# Patient Record
Sex: Male | Born: 1990 | Race: White | Hispanic: No | Marital: Married | Smoking: Never smoker
Health system: Southern US, Community
[De-identification: ages and names within clinical notes are randomized; demographics above are authoritative.]

## PROBLEM LIST (undated history)

## (undated) DIAGNOSIS — M549 Dorsalgia, unspecified: Secondary | ICD-10-CM

## (undated) DIAGNOSIS — F419 Anxiety disorder, unspecified: Secondary | ICD-10-CM

## (undated) DIAGNOSIS — F988 Other specified behavioral and emotional disorders with onset usually occurring in childhood and adolescence: Secondary | ICD-10-CM

## (undated) DIAGNOSIS — T7840XA Allergy, unspecified, initial encounter: Secondary | ICD-10-CM

## (undated) HISTORY — DX: Anxiety disorder, unspecified: F41.9

## (undated) HISTORY — DX: Other specified behavioral and emotional disorders with onset usually occurring in childhood and adolescence: F98.8

## (undated) HISTORY — PX: TONSILLECTOMY: SHX5217

## (undated) HISTORY — DX: Allergy, unspecified, initial encounter: T78.40XA

## (undated) HISTORY — DX: Dorsalgia, unspecified: M54.9

---

## 2015-05-29 ENCOUNTER — Encounter: Payer: Self-pay | Admitting: Family Medicine

## 2015-05-29 DIAGNOSIS — F988 Other specified behavioral and emotional disorders with onset usually occurring in childhood and adolescence: Secondary | ICD-10-CM | POA: Insufficient documentation

## 2015-05-29 DIAGNOSIS — J302 Other seasonal allergic rhinitis: Secondary | ICD-10-CM | POA: Insufficient documentation

## 2015-05-29 DIAGNOSIS — G8929 Other chronic pain: Secondary | ICD-10-CM | POA: Insufficient documentation

## 2015-05-29 DIAGNOSIS — M5417 Radiculopathy, lumbosacral region: Secondary | ICD-10-CM | POA: Insufficient documentation

## 2015-05-29 DIAGNOSIS — M545 Low back pain, unspecified: Secondary | ICD-10-CM | POA: Insufficient documentation

## 2015-05-29 DIAGNOSIS — J3089 Other allergic rhinitis: Secondary | ICD-10-CM

## 2015-05-29 DIAGNOSIS — E739 Lactose intolerance, unspecified: Secondary | ICD-10-CM | POA: Insufficient documentation

## 2015-05-29 DIAGNOSIS — F410 Panic disorder [episodic paroxysmal anxiety] without agoraphobia: Secondary | ICD-10-CM | POA: Insufficient documentation

## 2015-05-31 ENCOUNTER — Ambulatory Visit: Payer: Self-pay | Admitting: Family Medicine

## 2015-06-03 ENCOUNTER — Encounter: Payer: Self-pay | Admitting: Family Medicine

## 2015-06-03 ENCOUNTER — Ambulatory Visit (INDEPENDENT_AMBULATORY_CARE_PROVIDER_SITE_OTHER): Payer: BLUE CROSS/BLUE SHIELD | Admitting: Family Medicine

## 2015-06-03 VITALS — BP 128/74 | HR 89 | Temp 98.0°F | Resp 18 | Ht 72.0 in | Wt 183.8 lb

## 2015-06-03 DIAGNOSIS — F988 Other specified behavioral and emotional disorders with onset usually occurring in childhood and adolescence: Secondary | ICD-10-CM

## 2015-06-03 DIAGNOSIS — F909 Attention-deficit hyperactivity disorder, unspecified type: Secondary | ICD-10-CM

## 2015-06-03 DIAGNOSIS — M545 Low back pain, unspecified: Secondary | ICD-10-CM

## 2015-06-03 DIAGNOSIS — F410 Panic disorder [episodic paroxysmal anxiety] without agoraphobia: Secondary | ICD-10-CM

## 2015-06-03 DIAGNOSIS — G8911 Acute pain due to trauma: Secondary | ICD-10-CM

## 2015-06-03 MED ORDER — AMPHETAMINE-DEXTROAMPHETAMINE 15 MG PO TABS: 15.0000 mg | ORAL_TABLET | Freq: Every day | ORAL | Status: DC

## 2015-06-03 MED ORDER — AMPHETAMINE-DEXTROAMPHET ER 25 MG PO CP24: 25.0000 mg | ORAL_CAPSULE | ORAL | Status: DC

## 2015-06-03 MED ORDER — ALPRAZOLAM 0.5 MG PO TABS: 0.5000 mg | ORAL_TABLET | ORAL | Status: DC | PRN

## 2015-06-03 MED ORDER — AMPHETAMINE-DEXTROAMPHETAMINE 15 MG PO TABS: 1.0000 | ORAL_TABLET | Freq: Every day | ORAL | Status: DC | PRN

## 2015-06-03 MED ORDER — AMPHETAMINE-DEXTROAMPHET ER 25 MG PO CP24: 25.0000 mg | ORAL_CAPSULE | Freq: Every morning | ORAL | Status: DC

## 2015-06-03 MED ORDER — CYCLOBENZAPRINE HCL 10 MG PO TABS: 10.0000 mg | ORAL_TABLET | Freq: Every day | ORAL | Status: DC
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
## 2015-09-06 ENCOUNTER — Ambulatory Visit (INDEPENDENT_AMBULATORY_CARE_PROVIDER_SITE_OTHER): Payer: BLUE CROSS/BLUE SHIELD | Admitting: Family Medicine

## 2015-09-06 ENCOUNTER — Encounter: Payer: Self-pay | Admitting: Family Medicine

## 2015-09-06 VITALS — BP 134/76 | HR 92 | Temp 98.7°F | Resp 18 | Ht 72.0 in | Wt 182.7 lb

## 2015-09-06 DIAGNOSIS — Z23 Encounter for immunization: Secondary | ICD-10-CM | POA: Diagnosis not present

## 2015-09-06 DIAGNOSIS — F410 Panic disorder [episodic paroxysmal anxiety] without agoraphobia: Secondary | ICD-10-CM

## 2015-09-06 DIAGNOSIS — J3089 Other allergic rhinitis: Secondary | ICD-10-CM

## 2015-09-06 DIAGNOSIS — J302 Other seasonal allergic rhinitis: Secondary | ICD-10-CM

## 2015-09-06 DIAGNOSIS — J309 Allergic rhinitis, unspecified: Secondary | ICD-10-CM

## 2015-09-06 DIAGNOSIS — M545 Low back pain, unspecified: Secondary | ICD-10-CM

## 2015-09-06 DIAGNOSIS — F988 Other specified behavioral and emotional disorders with onset usually occurring in childhood and adolescence: Secondary | ICD-10-CM

## 2015-09-06 DIAGNOSIS — G8929 Other chronic pain: Secondary | ICD-10-CM

## 2015-09-06 DIAGNOSIS — G8911 Acute pain due to trauma: Secondary | ICD-10-CM

## 2015-09-06 DIAGNOSIS — F909 Attention-deficit hyperactivity disorder, unspecified type: Secondary | ICD-10-CM

## 2015-09-06 MED ORDER — AMPHETAMINE-DEXTROAMPHETAMINE 15 MG PO TABS: 15.0000 mg | ORAL_TABLET | Freq: Every day | ORAL | Status: DC

## 2015-09-06 MED ORDER — AMPHETAMINE-DEXTROAMPHET ER 25 MG PO CP24: 25.0000 mg | ORAL_CAPSULE | ORAL | Status: DC

## 2015-09-06 MED ORDER — AMPHETAMINE-DEXTROAMPHET ER 25 MG PO CP24: 25.0000 mg | ORAL_CAPSULE | Freq: Every morning | ORAL | Status: DC

## 2015-09-06 MED ORDER — FLUTICASONE PROPIONATE 50 MCG/ACT NA SUSP: 2.0000 | Freq: Every day | NASAL | Status: DC

## 2015-09-06 MED ORDER — ALPRAZOLAM 0.5 MG PO TABS: 0.5000 mg | ORAL_TABLET | ORAL | Status: DC | PRN

## 2015-09-06 MED ORDER — AMPHETAMINE-DEXTROAMPHETAMINE 15 MG PO TABS: 1.0000 | ORAL_TABLET | Freq: Every day | ORAL | Status: DC | PRN

## 2015-09-06 MED ORDER — CYCLOBENZAPRINE HCL 10 MG PO TABS: 10.0000 mg | ORAL_TABLET | Freq: Every day | ORAL | Status: DC

## 2015-09-06 NOTE — Progress Notes (Signed)
Name: William Rivers   MRN: [id number]    DOB: 08-14-1991   Date:09/06/2015       Progress Note  Subjective  Chief Complaint  Chief Complaint  Patient presents with  . Medication Refill    3 month F/U  . ADHD    Well Controlled  . Allergic Rhinitis     Well controlled takes medication PRN    HPI  Chronic low back pain: he wakes up feeling very stiff in am's, working as an Personnel officer, always crawling under tight spaces. He states usually aching on lower back, but sometimes shoots down right leg. Worse when sitting for a prolonged period of time. Pain right now is 3/10.  He is not using Flexeril, he got a prescription for an acute episode, we will resume medication for him to take qhs.   ADHD: Havard works long hours, about 10 hours daily, takes Adderal XR in am's and a Adderal  at lunch, it helps him stay focuses and also helps him with anxiety. He has lost weight, but has been stable now. He states that without medication, girlfriend states he acts like a child. He also notices that when he does not take it he can't accomplish anything at home.   Anxiety: taking Alprazolam prn, 45 pills for 90 days, he denies side effects. He is aware of risk of taking it and driving.   AR: he has mild clear rhinorrhea, he also has some congestion at times, no itching.   Patient Active Problem List   Diagnosis Date Noted  . ADD (attention deficit disorder) 05/29/2015  . Chronic LBP 05/29/2015  . Lactose intolerance 05/29/2015  . Panic attack 05/29/2015  . Allergic rhinitis 05/29/2015  . Lumbosacral radiculitis 05/29/2015    Past Surgical History  Procedure Laterality Date  . Tonsillectomy      Family History  Problem Relation Age of Onset  . Anxiety disorder Mother     Social History   Social History  . Marital Status: Single    Spouse Name: N/A  . Number of Children: N/A  . Years of Education: N/A   Occupational History  . Not on file.   Social History Main Topics  .  Smoking status: Never Smoker   . Smokeless tobacco: Current User    Types: Chew  . Alcohol Use: 7.2 oz/week    12 Standard drinks or equivalent per week  . Drug Use: No  . Sexual Activity:    Partners: Female   Other Topics Concern  . Not on file   Social History Narrative     Current outpatient prescriptions:  .  ALPRAZolam (XANAX) 0.5 MG tablet, Take 1 tablet (0.5 mg total) by mouth as needed., Disp: 45 tablet, Rfl: 0 .  amphetamine-dextroamphetamine (ADDERALL XR) 25 MG 24 hr capsule, Take 1 capsule by mouth every morning., Disp: 30 capsule, Rfl: 0 .  amphetamine-dextroamphetamine (ADDERALL XR) 25 MG 24 hr capsule, Take 1 capsule by mouth every morning., Disp: 30 capsule, Rfl: 0 .  amphetamine-dextroamphetamine (ADDERALL XR) 25 MG 24 hr capsule, Take 1 capsule by mouth every morning., Disp: 30 capsule, Rfl: 0 .  amphetamine-dextroamphetamine (ADDERALL) 15 MG tablet, Take 1 tablet by mouth daily as needed. At lunch-when working long hours, Disp: 30 tablet, Rfl: 0 .  amphetamine-dextroamphetamine (ADDERALL) 15 MG tablet, Take 1 tablet by mouth daily., Disp: 30 tablet, Rfl: 0 .  amphetamine-dextroamphetamine (ADDERALL) 15 MG tablet, Take 1 tablet by mouth daily., Disp: 30 tablet, Rfl: 0 .  loratadine (  CLARITIN) 10 MG tablet, Take 1 tablet by mouth daily., Disp: , Rfl:  .  cyclobenzaprine (FLEXERIL) 10 MG tablet, Take 1 tablet (10 mg total) by mouth at bedtime., Disp: 30 tablet, Rfl: 2  No Known Allergies   ROS  Constitutional: Negative for fever or weight change.  Respiratory: Negative for cough and shortness of breath.   Cardiovascular: Negative for chest pain or palpitations.  Gastrointestinal: Negative for abdominal pain, no bowel changes.  Musculoskeletal: Negative for gait problem or joint swelling.  Skin: Negative for rash.  Neurological: Negative for dizziness or headache.  No other specific complaints in a complete review of systems (except as listed in HPI  above).  Objective  Filed Vitals:   09/06/15 1629  BP: 134/76  Pulse: 92  Temp: 98.7 F (37.1 C)  TempSrc: Oral  Resp: 18  Height: 6' (1.829 m)  Weight: 182 lb 11.2 oz (82.872 kg)  SpO2: 98%    Body mass index is 24.77 kg/(m^2).  Physical Exam  Constitutional: Patient appears well-developed and well-nourished.  No distress.  HEENT: head atraumatic, normocephalic, pupils equal and reactive to light,, neck supple, throat within normal limits, boggy turbinates and clear rhinorrhea Cardiovascular: Normal rate, regular rhythm and normal heart sounds.  No murmur heard. No BLE edema. Pulmonary/Chest: Effort normal and breath sounds normal. No respiratory distress. Abdominal: Soft.  There is no tenderness. Psychiatric: Patient has a normal mood and affect. behavior is normal. Judgment and thought content normal.   PHQ2/9: [facility] 09/06/2015 06/03/2015  Decreased Interest 0 0  Down, Depressed, Hopeless 0 0  PHQ - 2 Score 0 0     Fall Risk: Fall Risk  09/06/2015 06/03/2015  Falls in the past year? No No     Functional Status Survey: Is the patient deaf or have difficulty hearing?: No Does the patient have difficulty seeing, even when wearing glasses/contacts?: No Does the patient have difficulty concentrating, remembering, or making decisions?: No Does the patient have difficulty walking or climbing stairs?: No Does the patient have difficulty dressing or bathing?: No Does the patient have difficulty doing errands alone such as visiting a doctor's office or shopping?: No    Assessment & Plan  1. Chronic LBP   Advised Flexeril qhs and stretching and strengthening exercises for his back - cyclobenzaprine (FLEXERIL) 10 MG tablet; Take 1 tablet (10 mg total) by mouth at bedtime.  Dispense: 30 tablet; Refill: 2  2. Needs flu shot  - Flu Vaccine QUAD 36+ mos PF IM (Fluarix & Fluzone Quad PF) -refused  3. ADD (attention deficit disorder)  Continue  medication, working well for him - amphetamine-dextroamphetamine (ADDERALL XR) 25 MG 24 hr capsule; Take 1 capsule by mouth every morning.  Dispense: 30 capsule; Refill: 0 - amphetamine-dextroamphetamine (ADDERALL XR) 25 MG 24 hr capsule; Take 1 capsule by mouth every morning.  Dispense: 30 capsule; Refill: 0 - amphetamine-dextroamphetamine (ADDERALL XR) 25 MG 24 hr capsule; Take 1 capsule by mouth every morning.  Dispense: 30 capsule; Refill: 0 - amphetamine-dextroamphetamine (ADDERALL) 15 MG tablet; Take 1 tablet by mouth daily as needed. At lunch-when working long hours  Dispense: 30 tablet; Refill: 0 - amphetamine-dextroamphetamine (ADDERALL) 15 MG tablet; Take 1 tablet by mouth daily.  Dispense: 30 tablet; Refill: 0 - amphetamine-dextroamphetamine (ADDERALL) 15 MG tablet; Take 1 tablet by mouth daily.  Dispense: 30 tablet; Refill: 0  4. Panic attack  - ALPRAZolam (XANAX) 0.5 MG tablet; Take 1 tablet (0.5 mg total) by mouth as needed.  Dispense: 45 tablet; Refill: 0

## 2015-09-06 NOTE — Patient Instructions (Signed)

## 2015-11-11 ENCOUNTER — Encounter: Payer: Self-pay | Admitting: Family Medicine

## 2015-11-11 ENCOUNTER — Ambulatory Visit (INDEPENDENT_AMBULATORY_CARE_PROVIDER_SITE_OTHER): Payer: BLUE CROSS/BLUE SHIELD | Admitting: Family Medicine

## 2015-11-11 VITALS — BP 124/68 | HR 95 | Temp 98.3°F | Resp 16 | Ht 72.0 in | Wt 179.9 lb

## 2015-11-11 DIAGNOSIS — J3089 Other allergic rhinitis: Secondary | ICD-10-CM

## 2015-11-11 DIAGNOSIS — J309 Allergic rhinitis, unspecified: Secondary | ICD-10-CM

## 2015-11-11 DIAGNOSIS — F410 Panic disorder [episodic paroxysmal anxiety] without agoraphobia: Secondary | ICD-10-CM

## 2015-11-11 DIAGNOSIS — F988 Other specified behavioral and emotional disorders with onset usually occurring in childhood and adolescence: Secondary | ICD-10-CM

## 2015-11-11 DIAGNOSIS — M545 Low back pain, unspecified: Secondary | ICD-10-CM

## 2015-11-11 DIAGNOSIS — G8929 Other chronic pain: Secondary | ICD-10-CM | POA: Diagnosis not present

## 2015-11-11 DIAGNOSIS — F909 Attention-deficit hyperactivity disorder, unspecified type: Secondary | ICD-10-CM

## 2015-11-11 DIAGNOSIS — J302 Other seasonal allergic rhinitis: Secondary | ICD-10-CM

## 2015-11-11 MED ORDER — FLUTICASONE PROPIONATE 50 MCG/ACT NA SUSP: 2.0000 | Freq: Every day | NASAL | Status: DC

## 2015-11-11 MED ORDER — AMPHETAMINE-DEXTROAMPHETAMINE 15 MG PO TABS: 15.0000 mg | ORAL_TABLET | Freq: Every day | ORAL | Status: DC

## 2015-11-11 MED ORDER — AMPHETAMINE-DEXTROAMPHET ER 25 MG PO CP24: 25.0000 mg | ORAL_CAPSULE | Freq: Every morning | ORAL | Status: DC

## 2015-11-11 MED ORDER — CYCLOBENZAPRINE HCL 10 MG PO TABS: 10.0000 mg | ORAL_TABLET | Freq: Every day | ORAL | Status: DC

## 2015-11-11 MED ORDER — ALPRAZOLAM 0.5 MG PO TABS: 0.5000 mg | ORAL_TABLET | ORAL | Status: DC | PRN

## 2015-11-11 MED ORDER — AMPHETAMINE-DEXTROAMPHET ER 25 MG PO CP24: 25.0000 mg | ORAL_CAPSULE | ORAL | Status: DC

## 2015-11-11 MED ORDER — AMPHETAMINE-DEXTROAMPHETAMINE 15 MG PO TABS: 1.0000 | ORAL_TABLET | Freq: Every day | ORAL | Status: DC | PRN

## 2015-11-11 NOTE — Progress Notes (Signed)
Name: William Rivers   MRN: [id number]    DOB: 1991/05/23   Date:11/11/2015       Progress Note  Subjective  Chief Complaint  Chief Complaint  Patient presents with  . Medication Refill    3 month F/U  . ADD    Well controlled  . Panic Attacks    HPI  Chronic low back pain: he wakes up feeling very stiff in am's, but sleeping better with flexeril. Works as an Personnel officerelectrician, always crawling under tight spaces. He states usually aching on lower back, but sometimes shoots down right leg. Worse when sitting for a prolonged period of time. Pain right now is 2/10.  ADHD: William Rivers works long hours, about 10 hours daily, takes Adderal XR in am's and a Adderal 15mg  at lunch, it helps him stay focuses and also helps him with anxiety, stops his mind from worrying  He has lost weight, and a few more pounds since last visit but weight loss has been mild now - he states he had an URI recently . He states that when he runs out of medication, girlfriend states he acts like a child. He also notices that when he does not take it he can't accomplish anything at home.   Anxiety: taking Alprazolam prn, 45 pills for 90 days, he only takes it prn for panic attacks,  he denies side effects. He is aware of risk of taking it and driving.   AR: he has mild clear rhinorrhea, he also has some congestion at times, no itching, occasional sneezing   Patient Active Problem List   Diagnosis Date Noted  . ADD (attention deficit disorder) 05/29/2015  . Chronic LBP 05/29/2015  . Lactose intolerance 05/29/2015  . Panic attack 05/29/2015  . Perennial allergic rhinitis with seasonal variation 05/29/2015  . Lumbosacral radiculitis 05/29/2015    Past Surgical History  Procedure Laterality Date  . Tonsillectomy      Family History  Problem Relation Age of Onset  . Anxiety disorder Mother     Social History   Social History  . Marital Status: Single    Spouse Name: N/A  . Number of Children: N/A  . Years of  Education: N/A   Occupational History  . Not on file.   Social History Main Topics  . Smoking status: Never Smoker   . Smokeless tobacco: Current User    Types: Chew  . Alcohol Use: 7.2 oz/week    12 Standard drinks or equivalent per week  . Drug Use: No  . Sexual Activity:    Partners: Female   Other Topics Concern  . Not on file   Social History Narrative     Current outpatient prescriptions:  .  ALPRAZolam (XANAX) 0.5 MG tablet, Take 1 tablet (0.5 mg total) by mouth as needed., Disp: 45 tablet, Rfl: 0 .  amphetamine-dextroamphetamine (ADDERALL XR) 25 MG 24 hr capsule, Take 1 capsule by mouth every morning., Disp: 30 capsule, Rfl: 0 .  amphetamine-dextroamphetamine (ADDERALL XR) 25 MG 24 hr capsule, Take 1 capsule by mouth every morning., Disp: 30 capsule, Rfl: 0 .  amphetamine-dextroamphetamine (ADDERALL XR) 25 MG 24 hr capsule, Take 1 capsule by mouth every morning., Disp: 30 capsule, Rfl: 0 .  amphetamine-dextroamphetamine (ADDERALL) 15 MG tablet, Take 1 tablet by mouth daily., Disp: 30 tablet, Rfl: 0 .  amphetamine-dextroamphetamine (ADDERALL) 15 MG tablet, Take 1 tablet by mouth daily as needed. At lunch-when working long hours, Disp: 30 tablet, Rfl: 0 .  amphetamine-dextroamphetamine (ADDERALL) 15  MG tablet, Take 1 tablet by mouth daily., Disp: 30 tablet, Rfl: 0 .  cyclobenzaprine (FLEXERIL) 10 MG tablet, Take 1 tablet (10 mg total) by mouth at bedtime., Disp: 90 tablet, Rfl: 1 .  fluticasone (FLONASE) 50 MCG/ACT nasal spray, Place 2 sprays into both nostrils daily., Disp: 48 g, Rfl: 1 .  loratadine (CLARITIN) 10 MG tablet, Take 1 tablet by mouth daily., Disp: , Rfl:   No Known Allergies   ROS  Ten systems reviewed and is negative except as mentioned in HPI   Objective  Filed Vitals:   11/11/15 1327  BP: 124/68  Pulse: 95  Temp: 98.3 F (36.8 C)  TempSrc: Oral  Resp: 16  Height: 6' (1.829 m)  Weight: 179 lb 14.4 oz (81.602 kg)  SpO2: 97%    Body mass  index is 24.39 kg/(m^2).  Physical Exam  Constitutional: Patient appears well-developed and well-nourished.  No distress.  HEENT: head atraumatic, normocephalic, pupils equal and reactive to light, neck supple, throat within normal limits Cardiovascular: Normal rate, regular rhythm and normal heart sounds.  No murmur heard. No BLE edema. Pulmonary/Chest: Effort normal and breath sounds normal. No respiratory distress. Abdominal: Soft.  There is no tenderness. Psychiatric: Patient has a normal mood and affect. behavior is normal. Judgment and thought content normal.   PHQ2/9: Depression screen [facility] 2/9 11/11/2015 09/06/2015 06/03/2015  Decreased Interest 0 0 0  Down, Depressed, Hopeless 0 0 0  PHQ - 2 Score 0 0 0   Fall Risk: Fall Risk  11/11/2015 09/06/2015 06/03/2015  Falls in the past year? No No No    Functional Status Survey: Is the patient deaf or have difficulty hearing?: No Does the patient have difficulty seeing, even when wearing glasses/contacts?: No Does the patient have difficulty concentrating, remembering, or making decisions?: No Does the patient have difficulty walking or climbing stairs?: No Does the patient have difficulty dressing or bathing?: No Does the patient have difficulty doing errands alone such as visiting a doctor's office or shopping?: No    Assessment & Plan  1. ADD (attention deficit disorder)  Continue medications, he came in earlier today, because he will lose his insurance on January 1st, 2017 - amphetamine-dextroamphetamine (ADDERALL) 15 MG tablet; Take 1 tablet by mouth daily.  Dispense: 30 tablet; Refill: 0 - amphetamine-dextroamphetamine (ADDERALL XR) 25 MG 24 hr capsule; Take 1 capsule by mouth every morning.  Dispense: 30 capsule; Refill: 0 - amphetamine-dextroamphetamine (ADDERALL XR) 25 MG 24 hr capsule; Take 1 capsule by mouth every morning.  Dispense: 30 capsule; Refill: 0 - amphetamine-dextroamphetamine (ADDERALL XR) 25 MG 24 hr  capsule; Take 1 capsule by mouth every morning.  Dispense: 30 capsule; Refill: 0 - amphetamine-dextroamphetamine (ADDERALL) 15 MG tablet; Take 1 tablet by mouth daily as needed. At lunch-when working long hours  Dispense: 30 tablet; Refill: 0 - amphetamine-dextroamphetamine (ADDERALL) 15 MG tablet; Take 1 tablet by mouth daily.  Dispense: 30 tablet; Refill: 0  2. Perennial allergic rhinitis with seasonal variation  - fluticasone (FLONASE) 50 MCG/ACT nasal spray; Place 2 sprays into both nostrils daily.  Dispense: 48 g; Refill: 1  3. Chronic LBP  - cyclobenzaprine (FLEXERIL) 10 MG tablet; Take 1 tablet (10 mg total) by mouth at bedtime.  Dispense: 90 tablet; Refill: 1  4. Panic attack  - ALPRAZolam (XANAX) 0.5 MG tablet; Take 1 tablet (0.5 mg total) by mouth as needed.  Dispense: 45 tablet; Refill: 0

## 2015-11-29 ENCOUNTER — Ambulatory Visit: Payer: BLUE CROSS/BLUE SHIELD | Admitting: Family Medicine

## 2016-02-24 ENCOUNTER — Encounter: Payer: Self-pay | Admitting: Family Medicine

## 2016-02-24 ENCOUNTER — Ambulatory Visit (INDEPENDENT_AMBULATORY_CARE_PROVIDER_SITE_OTHER): Payer: Self-pay | Admitting: Family Medicine

## 2016-02-24 VITALS — BP 122/68 | HR 80 | Temp 98.0°F | Resp 18 | Ht 72.0 in | Wt 177.3 lb

## 2016-02-24 DIAGNOSIS — M545 Low back pain, unspecified: Secondary | ICD-10-CM

## 2016-02-24 DIAGNOSIS — G8929 Other chronic pain: Secondary | ICD-10-CM

## 2016-02-24 DIAGNOSIS — F410 Panic disorder [episodic paroxysmal anxiety] without agoraphobia: Secondary | ICD-10-CM

## 2016-02-24 DIAGNOSIS — J309 Allergic rhinitis, unspecified: Secondary | ICD-10-CM

## 2016-02-24 DIAGNOSIS — F988 Other specified behavioral and emotional disorders with onset usually occurring in childhood and adolescence: Secondary | ICD-10-CM

## 2016-02-24 DIAGNOSIS — J302 Other seasonal allergic rhinitis: Secondary | ICD-10-CM

## 2016-02-24 DIAGNOSIS — J3089 Other allergic rhinitis: Secondary | ICD-10-CM

## 2016-02-24 DIAGNOSIS — F909 Attention-deficit hyperactivity disorder, unspecified type: Secondary | ICD-10-CM

## 2016-02-24 MED ORDER — FLUTICASONE PROPIONATE 50 MCG/ACT NA SUSP: 2.0000 | Freq: Every day | NASAL | Status: DC

## 2016-02-24 MED ORDER — AMPHETAMINE-DEXTROAMPHETAMINE 15 MG PO TABS: 15.0000 mg | ORAL_TABLET | Freq: Every day | ORAL | Status: DC

## 2016-02-24 MED ORDER — AMPHETAMINE-DEXTROAMPHET ER 25 MG PO CP24: 25.0000 mg | ORAL_CAPSULE | Freq: Every morning | ORAL | Status: DC

## 2016-02-24 MED ORDER — AMPHETAMINE-DEXTROAMPHET ER 25 MG PO CP24: 25.0000 mg | ORAL_CAPSULE | ORAL | Status: DC

## 2016-02-24 MED ORDER — ALPRAZOLAM 0.5 MG PO TABS: 0.5000 mg | ORAL_TABLET | ORAL | Status: DC | PRN

## 2016-02-24 MED ORDER — AMPHETAMINE-DEXTROAMPHETAMINE 15 MG PO TABS: 1.0000 | ORAL_TABLET | Freq: Every day | ORAL | Status: DC | PRN

## 2016-02-24 NOTE — Progress Notes (Signed)
Name: William Rivers   MRN: [id number]    DOB: 04-26-1991   Date:02/24/2016       Progress Note  Subjective  Chief Complaint  Chief Complaint  Patient presents with  . Medication Refill    3 month F/U  . ADD    Well controlled, still working 12 hours daily with the two different dosages.   Marland Kitchen. Spasms    When patient back bothers him he will take the medication to control symptoms, takes 2-3 weekly  . Allergic Rhinitis     Well controlled    HPI  Chronic low back pain: he wakes up feeling very stiff in am's, but sleeping better with flexeril. Works as an Personnel officerelectrician, always crawling under tight spaces. He states usually aching on lower back, but sometimes shoots down right leg. Worse when sitting for a prolonged period of time. Pain right now is 3/10. Currently only taking Flexeril prn   ADHD: William Castillandrew works long hours, about 10 hours daily, takes Adderal XR in am's and a Adderal 15mg  around 1 pm, it helps him stay focused and also helps him with anxiety, stops his mind from worrying He has lost a few more pounds since last visit weight. Weight loss has been mild now  . He states that when he runs out of medication, girlfriend states he acts like a child. He also notices that when he does not take it he can't accomplish anything at home.   Anxiety: taking Alprazolam prn, 45 pills for 90 days, he only takes it prn for panic attacks, he denies side effects. He is aware of risk of taking it and driving.   AR: he has mild clear rhinorrhea, he also has some congestion at times, no itching, occasional sneezing. Doing well as long as he uses Flonase   Patient Active Problem List   Diagnosis Date Noted  . ADD (attention deficit disorder) 05/29/2015  . Chronic LBP 05/29/2015  . Lactose intolerance 05/29/2015  . Panic attack 05/29/2015  . Perennial allergic rhinitis with seasonal variation 05/29/2015  . Lumbosacral radiculitis 05/29/2015    Past Surgical History  Procedure Laterality Date   . Tonsillectomy      Family History  Problem Relation Age of Onset  . Anxiety disorder Mother     Social History   Social History  . Marital Status: Single    Spouse Name: N/A  . Number of Children: N/A  . Years of Education: N/A   Occupational History  . Not on file.   Social History Main Topics  . Smoking status: Never Smoker   . Smokeless tobacco: Current User    Types: Chew  . Alcohol Use: 7.2 oz/week    12 Standard drinks or equivalent per week  . Drug Use: No  . Sexual Activity:    Partners: Female   Other Topics Concern  . Not on file   Social History Narrative     Current outpatient prescriptions:  .  ALPRAZolam (XANAX) 0.5 MG tablet, Take 1 tablet (0.5 mg total) by mouth as needed., Disp: 45 tablet, Rfl: 0 .  amphetamine-dextroamphetamine (ADDERALL XR) 25 MG 24 hr capsule, Take 1 capsule by mouth every morning., Disp: 30 capsule, Rfl: 0 .  amphetamine-dextroamphetamine (ADDERALL XR) 25 MG 24 hr capsule, Take 1 capsule by mouth every morning., Disp: 30 capsule, Rfl: 0 .  amphetamine-dextroamphetamine (ADDERALL XR) 25 MG 24 hr capsule, Take 1 capsule by mouth every morning., Disp: 30 capsule, Rfl: 0 .  amphetamine-dextroamphetamine (ADDERALL) 15  MG tablet, Take 1 tablet by mouth daily., Disp: 30 tablet, Rfl: 0 .  amphetamine-dextroamphetamine (ADDERALL) 15 MG tablet, Take 1 tablet by mouth daily as needed. At lunch-when working long hours, Disp: 30 tablet, Rfl: 0 .  amphetamine-dextroamphetamine (ADDERALL) 15 MG tablet, Take 1 tablet by mouth daily., Disp: 30 tablet, Rfl: 0 .  cyclobenzaprine (FLEXERIL) 10 MG tablet, Take 1 tablet (10 mg total) by mouth at bedtime., Disp: 90 tablet, Rfl: 1 .  fluticasone (FLONASE) 50 MCG/ACT nasal spray, Place 2 sprays into both nostrils daily., Disp: 48 g, Rfl: 1 .  loratadine (CLARITIN) 10 MG tablet, Take 1 tablet by mouth daily., Disp: , Rfl:   No Known Allergies   ROS  Constitutional: Negative for fever or significant   weight change.  Respiratory: Negative for cough and shortness of breath.   Cardiovascular: Negative for chest pain or palpitations.  Gastrointestinal: Negative for abdominal pain, no bowel changes.  Musculoskeletal: Negative for gait problem or joint swelling.  Skin: Negative for rash.  Neurological: Negative for dizziness or headache.  No other specific complaints in a complete review of systems (except as listed in HPI above).  Objective  Filed Vitals:   02/24/16 1450  BP: 122/68  Pulse: 80  Temp: 98 F (36.7 C)  TempSrc: Oral  Resp: 18  Height: 6' (1.829 m)  Weight: 177 lb 4.8 oz (80.423 kg)  SpO2: 98%    Body mass index is 24.04 kg/(m^2).  Physical Exam  Constitutional: Patient appears well-developed and well-nourished.  No distress.  HEENT: head atraumatic, normocephalic, pupils equal and reactive to light,  neck supple, throat within normal limits Cardiovascular: Normal rate, regular rhythm and normal heart sounds.  No murmur heard. No BLE edema. Pulmonary/Chest: Effort normal and breath sounds normal. No respiratory distress. Abdominal: Soft.  There is no tenderness. Psychiatric: Patient has a normal mood and affect. behavior is normal. Judgment and thought content normal. Muscular Skeletal: negative straight leg raise, mild pain right lower back    PHQ2/9: Depression screen [facility] 2/9 02/24/2016 11/11/2015 09/06/2015 06/03/2015  Decreased Interest 0 0 0 0  Down, Depressed, Hopeless 0 0 0 0  PHQ - 2 Score 0 0 0 0     Fall Risk: Fall Risk  02/24/2016 11/11/2015 09/06/2015 06/03/2015  Falls in the past year? No No No No     Functional Status Survey: Is the patient deaf or have difficulty hearing?: No Does the patient have difficulty seeing, even when wearing glasses/contacts?: No Does the patient have difficulty concentrating, remembering, or making decisions?: No Does the patient have difficulty walking or climbing stairs?: No Does the patient have difficulty  dressing or bathing?: No Does the patient have difficulty doing errands alone such as visiting a doctor's office or shopping?: No    Assessment & Plan  1. ADD (attention deficit disorder)  - amphetamine-dextroamphetamine (ADDERALL XR) 25 MG 24 hr capsule; Take 1 capsule by mouth every morning.  Dispense: 30 capsule; Refill: 0 - amphetamine-dextroamphetamine (ADDERALL XR) 25 MG 24 hr capsule; Take 1 capsule by mouth every morning.  Dispense: 30 capsule; Refill: 0 - amphetamine-dextroamphetamine (ADDERALL XR) 25 MG 24 hr capsule; Take 1 capsule by mouth every morning.  Dispense: 30 capsule; Refill: 0 - amphetamine-dextroamphetamine (ADDERALL) 15 MG tablet; Take 1 tablet by mouth daily.  Dispense: 30 tablet; Refill: 0 - amphetamine-dextroamphetamine (ADDERALL) 15 MG tablet; Take 1 tablet by mouth daily as needed. At lunch-when working long hours  Dispense: 30 tablet; Refill: 0 - amphetamine-dextroamphetamine (ADDERALL)  15 MG tablet; Take 1 tablet by mouth daily.  Dispense: 30 tablet; Refill: 0  2. Perennial allergic rhinitis with seasonal variation  - fluticasone (FLONASE) 50 MCG/ACT nasal spray; Place 2 sprays into both nostrils daily.  Dispense: 48 g; Refill: 5  3. Chronic LBP  Continue prn Flexeril   4. Panic attack  - ALPRAZolam (XANAX) 0.5 MG tablet; Take 1 tablet (0.5 mg total) by mouth as needed.  Dispense: 45 tablet; Refill: 0

## 2016-05-26 ENCOUNTER — Ambulatory Visit (INDEPENDENT_AMBULATORY_CARE_PROVIDER_SITE_OTHER): Payer: Self-pay | Admitting: Family Medicine

## 2016-05-26 ENCOUNTER — Encounter: Payer: Self-pay | Admitting: Family Medicine

## 2016-05-26 VITALS — HR 94 | Temp 98.4°F | Resp 18 | Wt 179.7 lb

## 2016-05-26 DIAGNOSIS — J3089 Other allergic rhinitis: Secondary | ICD-10-CM

## 2016-05-26 DIAGNOSIS — F410 Panic disorder [episodic paroxysmal anxiety] without agoraphobia: Secondary | ICD-10-CM

## 2016-05-26 DIAGNOSIS — F909 Attention-deficit hyperactivity disorder, unspecified type: Secondary | ICD-10-CM

## 2016-05-26 DIAGNOSIS — J302 Other seasonal allergic rhinitis: Secondary | ICD-10-CM

## 2016-05-26 DIAGNOSIS — G8929 Other chronic pain: Secondary | ICD-10-CM

## 2016-05-26 DIAGNOSIS — M545 Low back pain, unspecified: Secondary | ICD-10-CM

## 2016-05-26 DIAGNOSIS — F988 Other specified behavioral and emotional disorders with onset usually occurring in childhood and adolescence: Secondary | ICD-10-CM

## 2016-05-26 DIAGNOSIS — J309 Allergic rhinitis, unspecified: Secondary | ICD-10-CM

## 2016-05-26 MED ORDER — AMPHETAMINE-DEXTROAMPHET ER 25 MG PO CP24: 25.0000 mg | ORAL_CAPSULE | ORAL | Status: DC

## 2016-05-26 MED ORDER — AMPHETAMINE-DEXTROAMPHETAMINE 15 MG PO TABS: 15.0000 mg | ORAL_TABLET | Freq: Every day | ORAL | Status: DC

## 2016-05-26 MED ORDER — ALPRAZOLAM 0.5 MG PO TABS: 0.5000 mg | ORAL_TABLET | ORAL | Status: DC | PRN

## 2016-05-26 MED ORDER — AMPHETAMINE-DEXTROAMPHETAMINE 15 MG PO TABS: 1.0000 | ORAL_TABLET | Freq: Every day | ORAL | Status: DC | PRN

## 2016-05-26 MED ORDER — AMPHETAMINE-DEXTROAMPHET ER 25 MG PO CP24: 25.0000 mg | ORAL_CAPSULE | Freq: Every morning | ORAL | Status: DC

## 2016-05-26 NOTE — Addendum Note (Signed)
Addended by: Alba CorySOWLES, Manvi Guilliams F on: 05/26/2016 03:38 PM   Modules accepted: Orders

## 2016-05-26 NOTE — Progress Notes (Signed)
Name: William Rivers   MRN: [id number]    DOB: Feb 08, 1991   Date:05/26/2016       Progress Note  Subjective  Chief Complaint  Chief Complaint  Patient presents with  . Medication Refill  . ADD    HPI  Chronic low back pain: he wakes up feeling very stiff in am's, he states he is feeling better and has not been taking Flexeril every night.  Works as an Personnel officerelectrician, always crawling under tight spaces. He states usually aching on lower back, but sometimes shoots down right leg. Worse when sitting for a prolonged period of time. Pain right now is 3/10. Currently only taking Flexeril prn   ADHD: [name] works long hours, about 12-15 hours daily, takes Adderal XR in am's and a Adderal 15mg  around 1 pm, it helps him stay focused and also helps him with anxiety, stops his mind from worrying He has lost a few more pounds since last visit weight. Weight loss has been mild now . He states that when he runs out of medication he can't function.  Anxiety: taking Alprazolam prn, 45 pills for 90 days, he only takes it prn for panic attacks, he denies side effects. He is aware of risk of taking it and driving. Panic attacks have been stable, it happens in spurts, lately about twice weekly, looking forward to going on a vacation to the beach with his family   AR: he has mild clear rhinorrhea, he also has some congestion at times, no itching, occasional sneezing. Doing well as long as he uses Flonase   Patient Active Problem List   Diagnosis Date Noted  . ADD (attention deficit disorder) 05/29/2015  . Chronic LBP 05/29/2015  . Lactose intolerance 05/29/2015  . Panic attack 05/29/2015  . Perennial allergic rhinitis with seasonal variation 05/29/2015  . Lumbosacral radiculitis 05/29/2015    Past Surgical History  Procedure Laterality Date  . Tonsillectomy      Family History  Problem Relation Age of Onset  . Anxiety disorder Mother     Social History   Social History  . Marital Status:  Single    Spouse Name: N/A  . Number of Children: N/A  . Years of Education: N/A   Occupational History  . Not on file.   Social History Main Topics  . Smoking status: Never Smoker   . Smokeless tobacco: Current User    Types: Chew  . Alcohol Use: 7.2 oz/week    12 Standard drinks or equivalent per week  . Drug Use: No  . Sexual Activity:    Partners: Female   Other Topics Concern  . Not on file   Social History Narrative     Current outpatient prescriptions:  .  ALPRAZolam (XANAX) 0.5 MG tablet, Take 1 tablet (0.5 mg total) by mouth as needed., Disp: 45 tablet, Rfl: 0 .  amphetamine-dextroamphetamine (ADDERALL XR) 25 MG 24 hr capsule, Take 1 capsule by mouth every morning., Disp: 30 capsule, Rfl: 0 .  amphetamine-dextroamphetamine (ADDERALL XR) 25 MG 24 hr capsule, Take 1 capsule by mouth every morning., Disp: 30 capsule, Rfl: 0 .  amphetamine-dextroamphetamine (ADDERALL XR) 25 MG 24 hr capsule, Take 1 capsule by mouth every morning., Disp: 30 capsule, Rfl: 0 .  amphetamine-dextroamphetamine (ADDERALL) 15 MG tablet, Take 1 tablet by mouth daily., Disp: 30 tablet, Rfl: 0 .  amphetamine-dextroamphetamine (ADDERALL) 15 MG tablet, Take 1 tablet by mouth daily., Disp: 30 tablet, Rfl: 0 .  amphetamine-dextroamphetamine (ADDERALL) 15 MG tablet, Take 1  tablet by mouth daily as needed. At lunch-when working long hours, Disp: 30 tablet, Rfl: 0 .  cyclobenzaprine (FLEXERIL) 10 MG tablet, Take 1 tablet (10 mg total) by mouth at bedtime., Disp: 90 tablet, Rfl: 1 .  fluticasone (FLONASE) 50 MCG/ACT nasal spray, Place 2 sprays into both nostrils daily., Disp: 48 g, Rfl: 5 .  loratadine (CLARITIN) 10 MG tablet, Take 1 tablet by mouth daily., Disp: , Rfl:   No Known Allergies   ROS  Constitutional: Negative for fever or weight change.  Respiratory: Negative for cough and shortness of breath.   Cardiovascular: Negative for chest pain or palpitations.  Gastrointestinal: Negative for  abdominal pain, no bowel changes.  Musculoskeletal: Negative for gait problem or joint swelling.  Skin: Negative for rash.  Neurological: Negative for dizziness or headache.  No other specific complaints in a complete review of systems (except as listed in HPI above).  Objective  Filed Vitals:   05/26/16 1453  Pulse: 94  Temp: 98.4 F (36.9 C)  TempSrc: Oral  Resp: 18  Weight: 179 lb 11.2 oz (81.511 kg)  SpO2: 97%    Body mass index is 24.37 kg/(m^2).  Physical Exam  Constitutional: Patient appears well-developed and well-nourished. No distress.  HEENT: head atraumatic, normocephalic, pupils equal and reactive to light,  neck supple, throat within normal limits Cardiovascular: Normal rate, regular rhythm and normal heart sounds.  No murmur heard. No BLE edema. Pulmonary/Chest: Effort normal and breath sounds normal. No respiratory distress. Abdominal: Soft.  There is no tenderness. Psychiatric: Patient has a normal mood and affect. behavior is normal. Judgment and thought content normal.  PHQ2/9: Depression screen [facility] 2/9 05/26/2016 02/24/2016 11/11/2015 09/06/2015 06/03/2015  Decreased Interest 0 0 0 0 0  Down, Depressed, Hopeless 0 0 0 0 0  PHQ - 2 Score 0 0 0 0 0   Fall Risk: Fall Risk  05/26/2016 02/24/2016 11/11/2015 09/06/2015 06/03/2015  Falls in the past year? No No No No No    Functional Status Survey: Is the patient deaf or have difficulty hearing?: No Does the patient have difficulty seeing, even when wearing glasses/contacts?: No Does the patient have difficulty concentrating, remembering, or making decisions?: No Does the patient have difficulty walking or climbing stairs?: No Does the patient have difficulty dressing or bathing?: No Does the patient have difficulty doing errands alone such as visiting a doctor's office or shopping?: No   Assessment & Plan  1. ADD (attention deficit disorder)  - amphetamine-dextroamphetamine (ADDERALL) 15 MG tablet; Take 1  tablet by mouth daily.  Dispense: 30 tablet; Refill: 0 - amphetamine-dextroamphetamine (ADDERALL XR) 25 MG 24 hr capsule; Take 1 capsule by mouth every morning.  Dispense: 30 capsule; Refill: 0 - amphetamine-dextroamphetamine (ADDERALL XR) 25 MG 24 hr capsule; Take 1 capsule by mouth every morning.  Dispense: 30 capsule; Refill: 0 - amphetamine-dextroamphetamine (ADDERALL XR) 25 MG 24 hr capsule; Take 1 capsule by mouth every morning.  Dispense: 30 capsule; Refill: 0 - amphetamine-dextroamphetamine (ADDERALL) 15 MG tablet; Take 1 tablet by mouth daily.  Dispense: 30 tablet; Refill: 0 - amphetamine-dextroamphetamine (ADDERALL) 15 MG tablet; Take 1 tablet by mouth daily as needed. At lunch-when working long hours  Dispense: 30 tablet; Refill: 0  2. Perennial allergic rhinitis with seasonal variation  Continue medication   3. Chronic LBP  Doing better, weaning off Flexeril   4. Panic attack  - ALPRAZolam (XANAX) 0.5 MG tablet; Take 1 tablet (0.5 mg total) by mouth as needed.  Dispense: 45  tablet; Refill: 0

## 2016-08-18 ENCOUNTER — Ambulatory Visit (INDEPENDENT_AMBULATORY_CARE_PROVIDER_SITE_OTHER): Payer: Self-pay | Admitting: Family Medicine

## 2016-08-18 ENCOUNTER — Encounter: Payer: Self-pay | Admitting: Family Medicine

## 2016-08-18 VITALS — BP 126/82 | HR 83 | Temp 98.8°F | Resp 16 | Ht 72.0 in | Wt 184.3 lb

## 2016-08-18 DIAGNOSIS — F410 Panic disorder [episodic paroxysmal anxiety] without agoraphobia: Secondary | ICD-10-CM

## 2016-08-18 DIAGNOSIS — M545 Low back pain, unspecified: Secondary | ICD-10-CM

## 2016-08-18 DIAGNOSIS — F988 Other specified behavioral and emotional disorders with onset usually occurring in childhood and adolescence: Secondary | ICD-10-CM

## 2016-08-18 DIAGNOSIS — G8929 Other chronic pain: Secondary | ICD-10-CM

## 2016-08-18 DIAGNOSIS — F909 Attention-deficit hyperactivity disorder, unspecified type: Secondary | ICD-10-CM

## 2016-08-18 MED ORDER — AMPHETAMINE-DEXTROAMPHETAMINE 15 MG PO TABS: 1.0000 | ORAL_TABLET | Freq: Every day | ORAL | 0 refills | Status: DC | PRN

## 2016-08-18 MED ORDER — AMPHETAMINE-DEXTROAMPHETAMINE 15 MG PO TABS: 15.0000 mg | ORAL_TABLET | Freq: Every day | ORAL | 0 refills | Status: DC

## 2016-08-18 MED ORDER — ALPRAZOLAM 0.5 MG PO TABS: 0.5000 mg | ORAL_TABLET | ORAL | 0 refills | Status: DC | PRN

## 2016-08-18 MED ORDER — AMPHETAMINE-DEXTROAMPHET ER 25 MG PO CP24: 25.0000 mg | ORAL_CAPSULE | ORAL | 0 refills | Status: DC

## 2016-08-18 MED ORDER — CYCLOBENZAPRINE HCL 10 MG PO TABS: 5.0000 mg | ORAL_TABLET | Freq: Every day | ORAL | 0 refills | Status: DC

## 2016-08-18 MED ORDER — AMPHETAMINE-DEXTROAMPHET ER 25 MG PO CP24: 25.0000 mg | ORAL_CAPSULE | Freq: Every morning | ORAL | 0 refills | Status: DC

## 2016-08-18 NOTE — Addendum Note (Signed)
Addended by: Alba CorySOWLES, Briellah Baik F on: 08/18/2016 01:52 PM   Modules accepted: Orders

## 2016-08-18 NOTE — Progress Notes (Signed)
Name: William Rivers   MRN: [id number]    DOB: [birth year]   Date:08/18/2016       Progress Note  Subjective  Chief Complaint  Chief Complaint  Patient presents with  . Medication Refill    3 month F/U  . ADHD    States the short release medication is not lasting as long as it use too. Patient will take it then about 4 hours afterwards his attention gets short.  . Anxiety    Unchanged  . Back Pain    Unchanged    HPI  Chronic low back pain: he wakes up feeling very stiff in am's, he states he has not been taking Flexeril because of cost, he goes to [facility] advised to transfer it to [facility].  Works as an Personnel officer, always crawling under tight spaces. He states usually aching on lower back, but sometimes shoots down right leg. Aggravated by  sitting for a prolonged period of time. Pain right now is 3-4/10.   ADHD: William Rivers works long hours, about 12 daily, takes Adderal XR in am's and states it is wearing off earlier than usual,  and a Adderal 15mg  around 2 pm, it helps him stay focused and also helps him with anxiety, stops his mind from worrying He has gained 4 lbs since last visit, weight has been stable now.  He states that when he runs out of medication he can't function.  Anxiety: taking Alprazolam prn, 45 pills for 90 days, he only takes it prn for panic attacks, he denies side effects. He is aware of risk of taking it and driving. Panic attacks have been stable, it happens in spurts, lately about twice weekly  Patient Active Problem List   Diagnosis Date Noted  . ADD (attention deficit disorder) 05/29/2015  . Chronic LBP 05/29/2015  . Lactose intolerance 05/29/2015  . Panic attack 05/29/2015  . Perennial allergic rhinitis with seasonal variation 05/29/2015  . Lumbosacral radiculitis 05/29/2015    Past Surgical History:  Procedure Laterality Date  . TONSILLECTOMY      Family History  Problem Relation Age of Onset  . Anxiety disorder Mother     Social History   Social  History  . Marital status: Single    Spouse name: N/A  . Number of children: N/A  . Years of education: N/A   Occupational History  . Not on file.   Social History Main Topics  . Smoking status: Never Smoker  . Smokeless tobacco: Current User    Types: Chew  . Alcohol use 7.2 oz/week    12 Standard drinks or equivalent per week  . Drug use: No  . Sexual activity: Yes    Partners: Female   Other Topics Concern  . Not on file   Social History Narrative  . No narrative on file     Current Outpatient Prescriptions:  .  ALPRAZolam (XANAX) 0.5 MG tablet, Take 1 tablet (0.5 mg total) by mouth as needed., Disp: 45 tablet, Rfl: 0 .  amphetamine-dextroamphetamine (ADDERALL XR) 25 MG 24 hr capsule, Take 1 capsule by mouth every morning., Disp: 30 capsule, Rfl: 0 .  amphetamine-dextroamphetamine (ADDERALL XR) 25 MG 24 hr capsule, Take 1 capsule by mouth every morning., Disp: 30 capsule, Rfl: 0 .  amphetamine-dextroamphetamine (ADDERALL XR) 25 MG 24 hr capsule, Take 1 capsule by mouth every morning., Disp: 30 capsule, Rfl: 0 .  amphetamine-dextroamphetamine (ADDERALL) 15 MG tablet, Take 1 tablet by mouth daily., Disp: 30 tablet, Rfl: 0 .  amphetamine-dextroamphetamine (  ADDERALL) 15 MG tablet, Take 1 tablet by mouth daily., Disp: 30 tablet, Rfl: 0 .  amphetamine-dextroamphetamine (ADDERALL) 15 MG tablet, Take 1 tablet by mouth daily as needed. At lunch-when working long hours, Disp: 30 tablet, Rfl: 0  No Known Allergies   ROS  Constitutional: Negative for fever or weight change.  Respiratory: Negative for cough and shortness of breath.   Cardiovascular: Negative for chest pain or palpitations.  Gastrointestinal: Negative for abdominal pain, no bowel changes.  Musculoskeletal: Negative for gait problem or joint swelling.  Skin: Negative for rash.  Neurological: Negative for dizziness or headache.  No other specific complaints in a complete review of systems (except as listed in HPI  above).  Objective  Vitals:   08/18/16 1327  BP: 126/82  Pulse: 83  Resp: 16  Temp: 98.8 F (37.1 C)  TempSrc: Oral  SpO2: 96%  Weight: 184 lb 4.8 oz (83.6 kg)  Height: 6' (1.829 m)    Body mass index is 25 kg/m.  Physical Exam  Constitutional: Patient appears well-developed and well-nourished. Obese  No distress.  HEENT: head atraumatic, normocephalic, pupils equal and reactive to light,  neck supple, throat within normal limits Cardiovascular: Normal rate, regular rhythm and normal heart sounds.  No murmur heard. No BLE edema. Pulmonary/Chest: Effort normal and breath sounds normal. No respiratory distress. Abdominal: Soft.  There is no tenderness. Psychiatric: Patient has a normal mood and affect. behavior is normal. Judgment and thought content normal. Muscular Skeletal: no pain during palpation of lumbar spine, negative straight leg raise  PHQ2/9: Depression screen [facility] 2/9 08/18/2016 05/26/2016 02/24/2016 11/11/2015 09/06/2015  Decreased Interest 0 0 0 0 0  Down, Depressed, Hopeless 0 0 0 0 0  PHQ - 2 Score 0 0 0 0 0    Fall Risk: Fall Risk  08/18/2016 05/26/2016 02/24/2016 11/11/2015 09/06/2015  Falls in the past year? No No No No No    Functional Status Survey: Is the patient deaf or have difficulty hearing?: No Does the patient have difficulty seeing, even when wearing glasses/contacts?: No Does the patient have difficulty concentrating, remembering, or making decisions?: No Does the patient have difficulty walking or climbing stairs?: No Does the patient have difficulty dressing or bathing?: No Does the patient have difficulty doing errands alone such as visiting a doctor's office or shopping?: No    Assessment & Plan  1. ADD (attention deficit disorder)  - amphetamine-dextroamphetamine (ADDERALL XR) 25 MG 24 hr capsule; Take 1 capsule by mouth every morning.  Dispense: 30 capsule; Refill: 0 - amphetamine-dextroamphetamine (ADDERALL XR) 25 MG 24 hr capsule;  Take 1 capsule by mouth every morning.  Dispense: 30 capsule; Refill: 0 - amphetamine-dextroamphetamine (ADDERALL XR) 25 MG 24 hr capsule; Take 1 capsule by mouth every morning.  Dispense: 30 capsule; Refill: 0 - amphetamine-dextroamphetamine (ADDERALL) 15 MG tablet; Take 1 tablet by mouth daily.  Dispense: 30 tablet; Refill: 0 - amphetamine-dextroamphetamine (ADDERALL) 15 MG tablet; Take 1 tablet by mouth daily.  Dispense: 30 tablet; Refill: 0 - amphetamine-dextroamphetamine (ADDERALL) 15 MG tablet; Take 1 tablet by mouth daily as needed. At lunch-when working long hours  Dispense: 30 tablet; Refill: 0  2. Chronic LBP  Resume Flexeril prn  3. Panic attack  - ALPRAZolam (XANAX) 0.5 MG tablet; Take 1 tablet (0.5 mg total) by mouth as needed.  Dispense: 45 tablet; Refill: 0

## 2016-10-23 ENCOUNTER — Other Ambulatory Visit: Payer: Self-pay | Admitting: Family Medicine

## 2016-10-23 NOTE — Telephone Encounter (Signed)
Patient came in today because he lost his last refill of Adderall. Explained it is a controlled medication and we may be able to fill it at most one week earlier. He will contact us back in a few weeks for a refill

## 2016-11-09 ENCOUNTER — Encounter: Payer: Self-pay | Admitting: Family Medicine

## 2016-11-09 ENCOUNTER — Ambulatory Visit (INDEPENDENT_AMBULATORY_CARE_PROVIDER_SITE_OTHER): Payer: Self-pay | Admitting: Family Medicine

## 2016-11-09 VITALS — HR 89 | Temp 98.7°F | Resp 16 | Ht 74.0 in | Wt 186.6 lb

## 2016-11-09 DIAGNOSIS — G8929 Other chronic pain: Secondary | ICD-10-CM

## 2016-11-09 DIAGNOSIS — F410 Panic disorder [episodic paroxysmal anxiety] without agoraphobia: Secondary | ICD-10-CM

## 2016-11-09 DIAGNOSIS — J3089 Other allergic rhinitis: Secondary | ICD-10-CM

## 2016-11-09 DIAGNOSIS — M545 Low back pain: Secondary | ICD-10-CM

## 2016-11-09 DIAGNOSIS — J302 Other seasonal allergic rhinitis: Secondary | ICD-10-CM

## 2016-11-09 DIAGNOSIS — F901 Attention-deficit hyperactivity disorder, predominantly hyperactive type: Secondary | ICD-10-CM

## 2016-11-09 MED ORDER — ALPRAZOLAM 0.5 MG PO TABS: 0.5000 mg | ORAL_TABLET | ORAL | 0 refills | Status: DC | PRN

## 2016-11-09 MED ORDER — AMPHETAMINE-DEXTROAMPHET ER 25 MG PO CP24: 25.0000 mg | ORAL_CAPSULE | ORAL | 0 refills | Status: DC

## 2016-11-09 MED ORDER — CYCLOBENZAPRINE HCL 10 MG PO TABS: 5.0000 mg | ORAL_TABLET | Freq: Every day | ORAL | 0 refills | Status: DC

## 2016-11-09 MED ORDER — AMPHETAMINE-DEXTROAMPHETAMINE 15 MG PO TABS: 15.0000 mg | ORAL_TABLET | Freq: Every day | ORAL | 0 refills | Status: DC

## 2016-11-09 MED ORDER — AMPHETAMINE-DEXTROAMPHETAMINE 15 MG PO TABS: 1.0000 | ORAL_TABLET | Freq: Every day | ORAL | 0 refills | Status: DC | PRN

## 2016-11-09 NOTE — Progress Notes (Signed)
Name: William Rivers   MRN: [id number]    DOB: 01/28/1991   Date:11/09/2016       Progress Note  Subjective  Chief Complaint  Chief Complaint  Patient presents with  . Medication Refill  . ADD    HPI   Chronic low back pain: he wakes up feeling very stiff in am's, he is takes Flexeril about 4 times a week before bed time and helps him sleep better, and wakes up not feeling sitff.  Works as an Personnel officerelectrician, always crawling under tight spaces. He states usually aching on lower back, but sometimes shoots down right leg. Aggravated by  sitting for a prolonged period of time. Pain right now is 3-4/10.   ADHD: William Rivers works long hours, about 12 daily, takes Adderal XR in am   and a Adderal 15mg  around 2 pm, it helps him stay focused and also helps him with anxiety, stops his mind from worrying He has gained 2 lbs since last visit, weight has been stable now.  He had someone help him clean his work truck and last rx of Adderal was tossed by accident. He has been without the medication for the past 18 days. Having difficulty staying on task, he is going home earlier and unable to rest as well at night.   Anxiety: taking Alprazolam prn, 45 pills for 90 days, he only takes it prn for panic attacks, he denies side effects. He is aware of risk of taking it and driving. Panic attacks have been stable, it happens in spurts, episodes of panic attacks about twice a week.   AR: doing well at this time, he has chronic clear rhinorrhea but no congestion  Patient Active Problem List   Diagnosis Date Noted  . ADD (attention deficit disorder) 05/29/2015  . Chronic LBP 05/29/2015  . Lactose intolerance 05/29/2015  . Panic attack 05/29/2015  . Perennial allergic rhinitis with seasonal variation 05/29/2015  . Lumbosacral radiculitis 05/29/2015    Past Surgical History:  Procedure Laterality Date  . TONSILLECTOMY      Family History  Problem Relation Age of Onset  . Anxiety disorder Mother      Social History   Social History  . Marital status: Single    Spouse name: N/A  . Number of children: N/A  . Years of education: N/A   Occupational History  . Not on file.   Social History Main Topics  . Smoking status: Never Smoker  . Smokeless tobacco: Current User    Types: Chew  . Alcohol use 7.2 oz/week    12 Standard drinks or equivalent per week  . Drug use: No  . Sexual activity: Yes    Partners: Female   Other Topics Concern  . Not on file   Social History Narrative  . No narrative on file     Current Outpatient Prescriptions:  .  ALPRAZolam (XANAX) 0.5 MG tablet, Take 1 tablet (0.5 mg total) by mouth as needed., Disp: 45 tablet, Rfl: 0 .  amphetamine-dextroamphetamine (ADDERALL XR) 25 MG 24 hr capsule, Take 1 capsule by mouth every morning., Disp: 30 capsule, Rfl: 0 .  amphetamine-dextroamphetamine (ADDERALL XR) 25 MG 24 hr capsule, Take 1 capsule by mouth every morning., Disp: 30 capsule, Rfl: 0 .  amphetamine-dextroamphetamine (ADDERALL XR) 25 MG 24 hr capsule, Take 1 capsule by mouth every morning., Disp: 30 capsule, Rfl: 0 .  amphetamine-dextroamphetamine (ADDERALL) 15 MG tablet, Take 1 tablet by mouth daily as needed. At lunch-when working long hours, Disp:  30 tablet, Rfl: 0 .  amphetamine-dextroamphetamine (ADDERALL) 15 MG tablet, Take 1 tablet by mouth daily., Disp: 30 tablet, Rfl: 0 .  amphetamine-dextroamphetamine (ADDERALL) 15 MG tablet, Take 1 tablet by mouth daily., Disp: 30 tablet, Rfl: 0 .  cyclobenzaprine (FLEXERIL) 10 MG tablet, Take 0.5-1 tablets (5-10 mg total) by mouth at bedtime., Disp: 90 tablet, Rfl: 0  No Known Allergies   ROS  Constitutional: Negative for fever or weight change.  Respiratory: Negative for cough and shortness of breath.   Cardiovascular: Negative for chest pain or palpitations.  Gastrointestinal: Negative for abdominal pain, no bowel changes.  Musculoskeletal: Negative for gait problem or joint swelling.  Skin:  Negative for rash.  Neurological: Negative for dizziness or headache.  No other specific complaints in a complete review of systems (except as listed in HPI above).  Objective  Vitals:   11/09/16 0803  Pulse: 89  Resp: 16  Temp: 98.7 F (37.1 C)  TempSrc: Oral  SpO2: 97%  Weight: 186 lb 9.6 oz (84.6 kg)  Height: 6\' 2"  (1.88 m)    Body mass index is 23.96 kg/m.  Physical Exam  Constitutional: Patient appears well-developed and well-nourished.  No distress.  HEENT: head atraumatic, normocephalic, pupils equal and reactive to light, neck supple, throat within normal limits Cardiovascular: Normal rate, regular rhythm and normal heart sounds.  No murmur heard. No BLE edema. Pulmonary/Chest: Effort normal and breath sounds normal. No respiratory distress. Abdominal: Soft.  There is no tenderness. Psychiatric: Patient has a normal mood and affect. behavior is normal. Judgment and thought content normal.   PHQ2/9: [facility] screen [facility] 2/9 08/18/2016 05/26/2016 02/24/2016 11/11/2015 09/06/2015  Decreased Interest 0 0 0 0 0  Down, Depressed, Hopeless 0 0 0 0 0  PHQ - 2 Score 0 0 0 0 0     Fall Risk: Fall Risk  08/18/2016 05/26/2016 02/24/2016 11/11/2015 09/06/2015  Falls in the past year? No No No No No     Assessment & Plan  1. Attention deficit hyperactivity disorder (ADHD), predominantly hyperactive type  - amphetamine-dextroamphetamine (ADDERALL) 15 MG tablet; Take 1 tablet by mouth daily as needed. At lunch-when working long hours  Dispense: 30 tablet; Refill: 0 - amphetamine-dextroamphetamine (ADDERALL XR) 25 MG 24 hr capsule; Take 1 capsule by mouth every morning.  Dispense: 30 capsule; Refill: 0 - amphetamine-dextroamphetamine (ADDERALL XR) 25 MG 24 hr capsule; Take 1 capsule by mouth every morning.  Dispense: 30 capsule; Refill: 0 - amphetamine-dextroamphetamine (ADDERALL XR) 25 MG 24 hr capsule; Take 1 capsule by mouth every morning.  Dispense: 30 capsule; Refill: 0 -  amphetamine-dextroamphetamine (ADDERALL) 15 MG tablet; Take 1 tablet by mouth daily.  Dispense: 30 tablet; Refill: 0 - amphetamine-dextroamphetamine (ADDERALL) 15 MG tablet; Take 1 tablet by mouth daily.  Dispense: 30 tablet; Refill: 0  2. Panic attack  - ALPRAZolam (XANAX) 0.5 MG tablet; Take 1 tablet (0.5 mg total) by mouth as needed.  Dispense: 45 tablet; Refill: 0  3. Perennial allergic rhinitis with seasonal variation  stable  4. Chronic bilateral low back pain without sciatica  - cyclobenzaprine (FLEXERIL) 10 MG tablet; Take 0.5-1 tablets (5-10 mg total) by mouth at bedtime.  Dispense: 90 tablet; Refill: 0

## 2017-02-07 ENCOUNTER — Encounter: Payer: Self-pay | Admitting: Family Medicine

## 2017-02-07 ENCOUNTER — Ambulatory Visit (INDEPENDENT_AMBULATORY_CARE_PROVIDER_SITE_OTHER): Payer: Self-pay | Admitting: Family Medicine

## 2017-02-07 VITALS — BP 118/64 | HR 81 | Temp 98.3°F | Resp 16 | Ht 74.0 in | Wt 190.4 lb

## 2017-02-07 DIAGNOSIS — J0100 Acute maxillary sinusitis, unspecified: Secondary | ICD-10-CM

## 2017-02-07 DIAGNOSIS — F901 Attention-deficit hyperactivity disorder, predominantly hyperactive type: Secondary | ICD-10-CM

## 2017-02-07 DIAGNOSIS — J302 Other seasonal allergic rhinitis: Secondary | ICD-10-CM

## 2017-02-07 DIAGNOSIS — F410 Panic disorder [episodic paroxysmal anxiety] without agoraphobia: Secondary | ICD-10-CM

## 2017-02-07 DIAGNOSIS — G8929 Other chronic pain: Secondary | ICD-10-CM

## 2017-02-07 DIAGNOSIS — M545 Low back pain: Secondary | ICD-10-CM

## 2017-02-07 MED ORDER — AMPHETAMINE-DEXTROAMPHETAMINE 15 MG PO TABS: 15.0000 mg | ORAL_TABLET | Freq: Every day | ORAL | 0 refills | Status: DC

## 2017-02-07 MED ORDER — ALPRAZOLAM 0.5 MG PO TABS: 0.5000 mg | ORAL_TABLET | ORAL | 0 refills | Status: DC | PRN

## 2017-02-07 MED ORDER — AMPHETAMINE-DEXTROAMPHET ER 25 MG PO CP24: 25.0000 mg | ORAL_CAPSULE | ORAL | 0 refills | Status: DC

## 2017-02-07 MED ORDER — LEVOCETIRIZINE DIHYDROCHLORIDE 5 MG PO TABS: 5.0000 mg | ORAL_TABLET | Freq: Every evening | ORAL | 0 refills | Status: DC

## 2017-02-07 MED ORDER — AZITHROMYCIN 500 MG PO TABS: 500.0000 mg | ORAL_TABLET | Freq: Every day | ORAL | 0 refills | Status: DC

## 2017-02-07 MED ORDER — AMPHETAMINE-DEXTROAMPHETAMINE 15 MG PO TABS: 1.0000 | ORAL_TABLET | Freq: Every day | ORAL | 0 refills | Status: DC | PRN

## 2017-02-07 MED ORDER — FLUTICASONE PROPIONATE 50 MCG/ACT NA SUSP: 2.0000 | Freq: Every day | NASAL | 2 refills | Status: DC

## 2017-02-07 NOTE — Progress Notes (Signed)
Name: William Rivers   MRN: [id number]    DOB: March 09, 1991   Date:02/07/2017       Progress Note  Subjective  Chief Complaint  Chief Complaint  Patient presents with  . ADHD    3 month follow up    HPI  Chronic low back pain: he wakes up feeling very stiff in am's, he is takes Flexeril about 2-3 times a week before bed time and helps him sleep better, and wakes up not feeling sitff.  Works as an Personnel officerelectrician, always crawling under tight spaces. He states usually aching on lower back, but sometimes shoots down right leg. Aggravated by sitting for a prolonged period of time. Pain right now is 2-3/10.   ADHD: William Rivers works long hours, about 12 daily, takes Adderal XR in am  and a Adderal 15mg  around 2pm, it helps him stay focused and also helps him with anxiety, stops his mind from worrying He has gained 3 lbs since last visit, weight has been stable now. He is a good employee and had a recent raise and good review.   Anxiety: taking Alprazolam prn, 45 pills for 90 days, he only takes it prn for panic attacks ( he has 3 pills left ), he denies side effects. He is aware of risk of taking it and driving. Panic attacks have been stable, it happens in spurts, episodes of panic attacks about twice a week. We will try to wean him off slowly to a max of 30 pills for 90 days   AR: doing well at this time, he has chronic clear rhinorrhea but no congestion  Sinusitis: he states his allergies has been bothering him more over the past 5 weeks, recently has noticed mucus drainage in the mornings, facial pressure and times his teeth is tender, post-nasal drainage and intermittent cough, no sense of smells. Normal appetite, no fever or chills. Taking otc medication but tired of feeling sick  Patient Active Problem List   Diagnosis Date Noted  . ADD (attention deficit disorder) 05/29/2015  . Chronic LBP 05/29/2015  . Lactose intolerance 05/29/2015  . Panic attack 05/29/2015  . Perennial allergic  rhinitis with seasonal variation 05/29/2015  . Lumbosacral radiculitis 05/29/2015    Past Surgical History:  Procedure Laterality Date  . TONSILLECTOMY      Family History  Problem Relation Age of Onset  . Anxiety disorder Mother     Social History   Social History  . Marital status: Single    Spouse name: N/A  . Number of children: N/A  . Years of education: N/A   Occupational History  . Not on file.   Social History Main Topics  . Smoking status: Never Smoker  . Smokeless tobacco: Current User    Types: Chew  . Alcohol use 7.2 oz/week    12 Standard drinks or equivalent per week  . Drug use: No  . Sexual activity: Yes    Partners: Female   Other Topics Concern  . Not on file   Social History Narrative  . No narrative on file     Current Outpatient Prescriptions:  .  ALPRAZolam (XANAX) 0.5 MG tablet, Take 1 tablet (0.5 mg total) by mouth as needed., Disp: 43 tablet, Rfl: 0 .  amphetamine-dextroamphetamine (ADDERALL XR) 25 MG 24 hr capsule, Take 1 capsule by mouth every morning., Disp: 30 capsule, Rfl: 0 .  amphetamine-dextroamphetamine (ADDERALL XR) 25 MG 24 hr capsule, Take 1 capsule by mouth every morning., Disp: 30 capsule, Rfl: 0 .  amphetamine-dextroamphetamine (ADDERALL XR) 25 MG 24 hr capsule, Take 1 capsule by mouth every morning., Disp: 30 capsule, Rfl: 0 .  amphetamine-dextroamphetamine (ADDERALL) 15 MG tablet, Take 1 tablet by mouth daily as needed. At lunch-when working long hours, Disp: 30 tablet, Rfl: 0 .  amphetamine-dextroamphetamine (ADDERALL) 15 MG tablet, Take 1 tablet by mouth daily., Disp: 30 tablet, Rfl: 0 .  amphetamine-dextroamphetamine (ADDERALL) 15 MG tablet, Take 1 tablet by mouth daily., Disp: 30 tablet, Rfl: 0 .  azithromycin (ZITHROMAX) 500 MG tablet, Take 1 tablet (500 mg total) by mouth daily., Disp: 3 tablet, Rfl: 0 .  cyclobenzaprine (FLEXERIL) 10 MG tablet, Take 0.5-1 tablets (5-10 mg total) by mouth at bedtime., Disp: 90 tablet,  Rfl: 0 .  fluticasone (FLONASE) 50 MCG/ACT nasal spray, Place 2 sprays into both nostrils daily., Disp: 16 g, Rfl: 2 .  levocetirizine (XYZAL) 5 MG tablet, Take 1 tablet (5 mg total) by mouth every evening., Disp: 90 tablet, Rfl: 0  No Known Allergies   ROS  Constitutional: Negative for fever or weight change.  Respiratory: Positive  For intermittent  cough but no  shortness of breath.   Cardiovascular: Negative for chest pain or palpitations.  Gastrointestinal: Negative for abdominal pain, no bowel changes.  Musculoskeletal: Negative for gait problem or joint swelling.  Skin: Negative for rash.  Neurological: Negative for dizziness or headache.  No other specific complaints in a complete review of systems (except as listed in HPI above).  Objective  Vitals:   02/07/17 0804  BP: 118/64  Pulse: 81  Resp: 16  Temp: 98.3 F (36.8 C)  SpO2: 97%  Weight: 190 lb 6 oz (86.4 kg)  Height: 6\' 2"  (1.88 m)    Body mass index is 24.44 kg/m.  Physical Exam  Constitutional: Patient appears well-developed and well-nourished.  No distress.  HEENT: head atraumatic, normocephalic, pupils equal and reactive to light,  neck supple, throat within normal limits, tender maxillary sinus, clear rhinorrhea.  Cardiovascular: Normal rate, regular rhythm and normal heart sounds.  No murmur heard. No BLE edema. Pulmonary/Chest: Effort normal and breath sounds normal. No respiratory distress. Abdominal: Soft.  There is no tenderness. Psychiatric: Patient has a normal mood and affect. behavior is normal. Judgment and thought content normal.  PHQ2/9: Depression screen [facility] 2/9 02/07/2017 08/18/2016 05/26/2016 02/24/2016 11/11/2015  Decreased Interest 0 0 0 0 0  Down, Depressed, Hopeless 0 0 0 0 0  PHQ - 2 Score 0 0 0 0 0     Fall Risk: Fall Risk  02/07/2017 08/18/2016 05/26/2016 02/24/2016 11/11/2015  Falls in the past year? No No No No No      Assessment & Plan  1. Chronic bilateral low back pain  without sciatica  Continue prn Flexeril   2. Attention deficit hyperactivity disorder (ADHD), predominantly hyperactive type  - amphetamine-dextroamphetamine (ADDERALL) 15 MG tablet; Take 1 tablet by mouth daily as needed. At lunch-when working long hours  Dispense: 30 tablet; Refill: 0 - amphetamine-dextroamphetamine (ADDERALL XR) 25 MG 24 hr capsule; Take 1 capsule by mouth every morning.  Dispense: 30 capsule; Refill: 0 - amphetamine-dextroamphetamine (ADDERALL XR) 25 MG 24 hr capsule; Take 1 capsule by mouth every morning.  Dispense: 30 capsule; Refill: 0 - amphetamine-dextroamphetamine (ADDERALL XR) 25 MG 24 hr capsule; Take 1 capsule by mouth every morning.  Dispense: 30 capsule; Refill: 0 - amphetamine-dextroamphetamine (ADDERALL) 15 MG tablet; Take 1 tablet by mouth daily.  Dispense: 30 tablet; Refill: 0 - amphetamine-dextroamphetamine (ADDERALL) 15 MG tablet; Take  1 tablet by mouth daily.  Dispense: 30 tablet; Refill: 0  3. Panic attack  - ALPRAZolam (XANAX) 0.5 MG tablet; Take 1 tablet (0.5 mg total) by mouth as needed.  Dispense: 43 tablet; Refill: 0  4. Acute non-recurrent maxillary sinusitis  - azithromycin (ZITHROMAX) 500 MG tablet; Take 1 tablet (500 mg total) by mouth daily.  Dispense: 3 tablet; Refill: 0  5. Acute seasonal allergic rhinitis, unspecified trigger  - fluticasone (FLONASE) 50 MCG/ACT nasal spray; Place 2 sprays into both nostrils daily.  Dispense: 16 g; Refill: 2 - levocetirizine (XYZAL) 5 MG tablet; Take 1 tablet (5 mg total) by mouth every evening.  Dispense: 90 tablet; Refill: 0

## 2017-05-10 ENCOUNTER — Ambulatory Visit (INDEPENDENT_AMBULATORY_CARE_PROVIDER_SITE_OTHER): Payer: Self-pay | Admitting: Family Medicine

## 2017-05-10 ENCOUNTER — Encounter: Payer: Self-pay | Admitting: Family Medicine

## 2017-05-10 VITALS — BP 118/72 | HR 78 | Temp 97.9°F | Resp 16 | Ht 74.0 in | Wt 183.1 lb

## 2017-05-10 DIAGNOSIS — J3089 Other allergic rhinitis: Secondary | ICD-10-CM

## 2017-05-10 DIAGNOSIS — F901 Attention-deficit hyperactivity disorder, predominantly hyperactive type: Secondary | ICD-10-CM

## 2017-05-10 DIAGNOSIS — F410 Panic disorder [episodic paroxysmal anxiety] without agoraphobia: Secondary | ICD-10-CM

## 2017-05-10 DIAGNOSIS — M545 Low back pain: Secondary | ICD-10-CM

## 2017-05-10 DIAGNOSIS — J302 Other seasonal allergic rhinitis: Secondary | ICD-10-CM

## 2017-05-10 DIAGNOSIS — G8929 Other chronic pain: Secondary | ICD-10-CM

## 2017-05-10 MED ORDER — AMPHETAMINE-DEXTROAMPHET ER 25 MG PO CP24: 25.0000 mg | ORAL_CAPSULE | ORAL | 0 refills | Status: DC

## 2017-05-10 MED ORDER — AMPHETAMINE-DEXTROAMPHETAMINE 15 MG PO TABS: 1.0000 | ORAL_TABLET | Freq: Every day | ORAL | 0 refills | Status: DC | PRN

## 2017-05-10 MED ORDER — CYCLOBENZAPRINE HCL 10 MG PO TABS: 5.0000 mg | ORAL_TABLET | Freq: Every day | ORAL | 0 refills | Status: DC

## 2017-05-10 MED ORDER — AMPHETAMINE-DEXTROAMPHETAMINE 15 MG PO TABS: 15.0000 mg | ORAL_TABLET | Freq: Every day | ORAL | 0 refills | Status: DC

## 2017-05-10 MED ORDER — ALPRAZOLAM 0.5 MG PO TABS: 0.5000 mg | ORAL_TABLET | ORAL | 0 refills | Status: DC | PRN

## 2017-05-10 NOTE — Progress Notes (Signed)
Name: William Rivers   MRN: [id number]    DOB: 15-Mar-1991   Date:05/10/2017       Progress Note  Subjective  Chief Complaint  Chief Complaint  Patient presents with  . Medication Refill    3 month F/U  . ADHD    Well controlled  . Anxiety  . Allergic Rhinitis     HPI   Chronic low back pain: he wakes up feeling very stiff in am's, he is takes Flexeril about 2-3 times a week before bed time and helps him sleep better, and wakes up not feeling sitff. Works as an Personnel officer, always crawling under tight spaces. He states usually aching on lower back, but sometimes shoots down right leg, episodes of radiculitis are almost daily, but does not last long, he needs to change positions and resolves.  Aggravated by sitting for a prolonged period of time. Pain right now is 3/10.   ADHD: William Rivers works long hours, about 10 daily, takes Adderal XR in am and a Adderal 15mg  around 2pm, it helps him stay focused and also helps him with anxiety, stops his mind from worrying He has lost 7  lbs since last visit, weight has been stable now. He is a good employee and had a recent raise and good review.   Anxiety: taking Alprazolam prn, 43 pills for 90 days, he only takes it prn for panic attacks ( he has 2 pills left ), he denies side effects. He is aware of risk of taking it and driving. Panic attacks have been stable, it happens in spurts, episodes of panic attacks about twice a week. We will try to wean him off slowly to a max of 30 pills for 90 days , we will go down to 40 pills now  AR: doing well at this time, he has chronic clear rhinorrhea but no congestion   Patient Active Problem List   Diagnosis Date Noted  . ADD (attention deficit disorder) 05/29/2015  . Chronic LBP 05/29/2015  . Lactose intolerance 05/29/2015  . Panic attack 05/29/2015  . Perennial allergic rhinitis with seasonal variation 05/29/2015  . Lumbosacral radiculitis 05/29/2015    Past Surgical History:  Procedure  Laterality Date  . TONSILLECTOMY      Family History  Problem Relation Age of Onset  . Anxiety disorder Mother     Social History   Social History  . Marital status: Single    Spouse name: N/A  . Number of children: N/A  . Years of education: N/A   Occupational History  . Not on file.   Social History Main Topics  . Smoking status: Never Smoker  . Smokeless tobacco: Current User    Types: Chew  . Alcohol use 10.8 oz/week    12 Standard drinks or equivalent, 6 Cans of beer per week  . Drug use: No  . Sexual activity: Yes    Partners: Female   Other Topics Concern  . Not on file   Social History Narrative  . No narrative on file     Current Outpatient Prescriptions:  .  ALPRAZolam (XANAX) 0.5 MG tablet, Take 1 tablet (0.5 mg total) by mouth as needed., Disp: 40 tablet, Rfl: 0 .  amphetamine-dextroamphetamine (ADDERALL XR) 25 MG 24 hr capsule, Take 1 capsule by mouth every morning., Disp: 30 capsule, Rfl: 0 .  amphetamine-dextroamphetamine (ADDERALL XR) 25 MG 24 hr capsule, Take 1 capsule by mouth every morning., Disp: 30 capsule, Rfl: 0 .  amphetamine-dextroamphetamine (ADDERALL XR) 25 MG  24 hr capsule, Take 1 capsule by mouth every morning., Disp: 30 capsule, Rfl: 0 .  amphetamine-dextroamphetamine (ADDERALL) 15 MG tablet, Take 1 tablet by mouth daily as needed. At lunch-when working long hours, Disp: 30 tablet, Rfl: 0 .  amphetamine-dextroamphetamine (ADDERALL) 15 MG tablet, Take 1 tablet by mouth daily., Disp: 30 tablet, Rfl: 0 .  amphetamine-dextroamphetamine (ADDERALL) 15 MG tablet, Take 1 tablet by mouth daily., Disp: 30 tablet, Rfl: 0 .  cyclobenzaprine (FLEXERIL) 10 MG tablet, Take 0.5-1 tablets (5-10 mg total) by mouth at bedtime., Disp: 90 tablet, Rfl: 0 .  fluticasone (FLONASE) 50 MCG/ACT nasal spray, Place 2 sprays into both nostrils daily., Disp: 16 g, Rfl: 2 .  levocetirizine (XYZAL) 5 MG tablet, Take 1 tablet (5 mg total) by mouth every evening., Disp: 90  tablet, Rfl: 0  No Known Allergies   ROS  Constitutional: Negative for fever, positive for weight change.  Respiratory: Negative for cough and shortness of breath.   Cardiovascular: Negative for chest pain or palpitations.  Gastrointestinal: Negative for abdominal pain, no bowel changes.  Musculoskeletal: Negative for gait problem or joint swelling.  Skin: Negative for rash.  Neurological: Negative for dizziness or headache.  No other specific complaints in a complete review of systems (except as listed in HPI above).  Objective  Vitals:   05/10/17 0843  BP: 118/72  Pulse: 78  Resp: 16  Temp: 97.9 F (36.6 C)  TempSrc: Oral  SpO2: 97%  Weight: 183 lb 1.6 oz (83.1 kg)  Height: 6\' 2"  (1.88 m)    Body mass index is 23.51 kg/m.  Physical Exam  Constitutional: Patient appears well-developed and well-nourished.  No distress.  HEENT: head atraumatic, normocephalic, pupils equal and reactive to light,neck supple, throat within normal limits Cardiovascular: Normal rate, regular rhythm and normal heart sounds.  No murmur heard. No BLE edema. Pulmonary/Chest: Effort normal and breath sounds normal. No respiratory distress. Abdominal: Soft.  There is no tenderness. Psychiatric: Patient has a normal mood and affect. behavior is normal. Judgment and thought content normal.  PHQ2/9: [facility] 05/10/2017 02/07/2017 08/18/2016 05/26/2016 02/24/2016  Decreased Interest 0 0 0 0 0  Down, Depressed, Hopeless 0 0 0 0 0  PHQ - 2 Score 0 0 0 0 0     Fall Risk: Fall Risk  05/10/2017 02/07/2017 08/18/2016 05/26/2016 02/24/2016  Falls in the past year? No No No No No     Functional Status Survey: Is the patient deaf or have difficulty hearing?: No Does the patient have difficulty seeing, even when wearing glasses/contacts?: No Does the patient have difficulty concentrating, remembering, or making decisions?: No Does the patient have difficulty walking or climbing stairs?: No Does  the patient have difficulty dressing or bathing?: No Does the patient have difficulty doing errands alone such as visiting a doctor's office or shopping?: No    Assessment & Plan  1. Chronic bilateral low back pain without sciatica  - cyclobenzaprine (FLEXERIL) 10 MG tablet; Take 0.5-1 tablets (5-10 mg total) by mouth at bedtime.  Dispense: 90 tablet; Refill: 0  2. Attention deficit hyperactivity disorder (ADHD), predominantly hyperactive type  - amphetamine-dextroamphetamine (ADDERALL) 15 MG tablet; Take 1 tablet by mouth daily as needed. At lunch-when working long hours  Dispense: 30 tablet; Refill: 0 - amphetamine-dextroamphetamine (ADDERALL XR) 25 MG 24 hr capsule; Take 1 capsule by mouth every morning.  Dispense: 30 capsule; Refill: 0 - amphetamine-dextroamphetamine (ADDERALL XR) 25 MG 24 hr capsule; Take 1 capsule by mouth  every morning.  Dispense: 30 capsule; Refill: 0 - amphetamine-dextroamphetamine (ADDERALL XR) 25 MG 24 hr capsule; Take 1 capsule by mouth every morning.  Dispense: 30 capsule; Refill: 0 - amphetamine-dextroamphetamine (ADDERALL) 15 MG tablet; Take 1 tablet by mouth daily.  Dispense: 30 tablet; Refill: 0 - amphetamine-dextroamphetamine (ADDERALL) 15 MG tablet; Take 1 tablet by mouth daily.  Dispense: 30 tablet; Refill: 0  3. Perennial allergic rhinitis with seasonal variation   Doing well at this time  4. Panic attack  - ALPRAZolam (XANAX) 0.5 MG tablet; Take 1 tablet (0.5 mg total) by mouth as needed.  Dispense: 40 tablet; Refill: 0

## 2017-08-10 ENCOUNTER — Ambulatory Visit (INDEPENDENT_AMBULATORY_CARE_PROVIDER_SITE_OTHER): Payer: Self-pay | Admitting: Family Medicine

## 2017-08-10 ENCOUNTER — Encounter: Payer: Self-pay | Admitting: Family Medicine

## 2017-08-10 VITALS — BP 118/64 | HR 93 | Temp 97.9°F | Resp 16 | Ht 74.0 in | Wt 187.6 lb

## 2017-08-10 DIAGNOSIS — M545 Low back pain, unspecified: Secondary | ICD-10-CM

## 2017-08-10 DIAGNOSIS — G8929 Other chronic pain: Secondary | ICD-10-CM

## 2017-08-10 DIAGNOSIS — J302 Other seasonal allergic rhinitis: Secondary | ICD-10-CM

## 2017-08-10 DIAGNOSIS — F901 Attention-deficit hyperactivity disorder, predominantly hyperactive type: Secondary | ICD-10-CM

## 2017-08-10 DIAGNOSIS — F410 Panic disorder [episodic paroxysmal anxiety] without agoraphobia: Secondary | ICD-10-CM

## 2017-08-10 DIAGNOSIS — J3089 Other allergic rhinitis: Secondary | ICD-10-CM

## 2017-08-10 MED ORDER — LEVOCETIRIZINE DIHYDROCHLORIDE 5 MG PO TABS: 5.0000 mg | ORAL_TABLET | Freq: Every evening | ORAL | 0 refills | Status: DC

## 2017-08-10 MED ORDER — AMPHETAMINE-DEXTROAMPHET ER 25 MG PO CP24: 25.0000 mg | ORAL_CAPSULE | ORAL | 0 refills | Status: DC

## 2017-08-10 MED ORDER — AMPHETAMINE-DEXTROAMPHETAMINE 15 MG PO TABS
1.0000 | ORAL_TABLET | Freq: Every day | ORAL | 0 refills | Status: DC | PRN
Start: 2017-08-10 — End: 2017-11-09

## 2017-08-10 MED ORDER — AMPHETAMINE-DEXTROAMPHETAMINE 15 MG PO TABS: 15.0000 mg | ORAL_TABLET | Freq: Every day | ORAL | 0 refills | Status: DC

## 2017-08-10 MED ORDER — CYCLOBENZAPRINE HCL 10 MG PO TABS: 5.0000 mg | ORAL_TABLET | Freq: Every day | ORAL | 0 refills | Status: DC

## 2017-08-10 MED ORDER — ALPRAZOLAM 0.5 MG PO TABS: 0.5000 mg | ORAL_TABLET | ORAL | 0 refills | Status: DC | PRN

## 2017-08-10 NOTE — Progress Notes (Signed)
Name: William Rivers   MRN: [id number]    DOB: May 26, 1991   Date:08/10/2017       Progress Note  Subjective  Chief Complaint  Chief Complaint  Patient presents with  . Medication Refill  . ADHD  . Allergic Rhinitis   . Back Pain    HPI  Chronic low back pain: he wakes up feeling very stiff in am's, he is takes Flexeril a few  times a week before bed time and helps him sleep better, and wakes up not feeling sitff. Works as an Personnel officer, always crawling under tight spaces. He states usually aching on lower back, but sometimes shoots down right leg, episodes of radiculitis are almost daily, but does not last long, he needs to change positions and resolves, symptoms waxes and wanes.  Aggravated by sitting for a prolonged period of time. Pain right now is 3/10.   ADHD: Yasmin works long hours, about 10 daily, takes Adderal XR in am and a Adderal  around 2pm, it helps him stay focused and also helps him with anxiety, stops his mind from worrying He has gained 4 lbs since last visit, weight has been stable now. 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   Anxiety: taking Alprazolam prn, 40 pills for 90 days, he only takes it prn for panic attacks, he denies side effects. He is aware of risk of taking it and driving. Panic attacks have been stable, it happens in spurts, episodes of panic attacks about twice a week. We will try to wean him off slowly to a max of 30 pills for 90 days, we will continue at 40 pills for now  AR: he is having a flare, for the past few days having worsening of rhinorrhea, nasal congestion, also has facial pressure and had chills three nights ago. He has been using sudafed with some improvement. He will resume allergy medication including nasal steroid.    Patient Active Problem List   Diagnosis Date Noted  . ADD (attention deficit disorder) 05/29/2015  . Chronic LBP 05/29/2015  . Lactose intolerance  05/29/2015  . Panic attack 05/29/2015  . Perennial allergic rhinitis with seasonal variation 05/29/2015  . Lumbosacral radiculitis 05/29/2015    Past Surgical History:  Procedure Laterality Date  . TONSILLECTOMY      Family History  Problem Relation Age of Onset  . Anxiety disorder Mother     Social History   Social History  . Marital status: Single    Spouse name: N/A  . Number of children: N/A  . Years of education: N/A   Occupational History  . Not on file.   Social History Main Topics  . Smoking status: Never Smoker  . Smokeless tobacco: Current User    Types: Chew  . Alcohol use 10.8 oz/week    12 Standard drinks or equivalent, 6 Cans of beer per week  . Drug use: No  . Sexual activity: Yes    Partners: Female   Other Topics Concern  . Not on file   Social History Narrative   She is dating, living with girlfriend and the mother of one of her children   He has two older children from previous relationship.      Current Outpatient Prescriptions:  .  ALPRAZolam (XANAX) 0.5 MG tablet, Take 1 tablet (0.5 mg total) by mouth as needed., Disp: 40 tablet, Rfl: 0 .  amphetamine-dextroamphetamine (ADDERALL XR) 25 MG 24 hr capsule, Take 1 capsule by mouth  every morning., Disp: 30 capsule, Rfl: 0 .  amphetamine-dextroamphetamine (ADDERALL XR) 25 MG 24 hr capsule, Take 1 capsule by mouth every morning., Disp: 30 capsule, Rfl: 0 .  amphetamine-dextroamphetamine (ADDERALL XR) 25 MG 24 hr capsule, Take 1 capsule by mouth every morning., Disp: 30 capsule, Rfl: 0 .  amphetamine-dextroamphetamine (ADDERALL) 15 MG tablet, Take 1 tablet by mouth daily as needed. At lunch-when working long hours, Disp: 30 tablet, Rfl: 0 .  amphetamine-dextroamphetamine (ADDERALL) 15 MG tablet, Take 1 tablet by mouth daily., Disp: 30 tablet, Rfl: 0 .  amphetamine-dextroamphetamine (ADDERALL) 15 MG tablet, Take 1 tablet by mouth daily., Disp: 30 tablet, Rfl: 0 .  cyclobenzaprine (FLEXERIL) 10 MG  tablet, Take 0.5-1 tablets (5-10 mg total) by mouth at bedtime., Disp: 90 tablet, Rfl: 0 .  fluticasone (FLONASE) 50 MCG/ACT nasal spray, Place 2 sprays into both nostrils daily., Disp: 16 g, Rfl: 2 .  levocetirizine (XYZAL) 5 MG tablet, Take 1 tablet (5 mg total) by mouth every evening., Disp: 90 tablet, Rfl: 0  No Known Allergies   ROS  Constitutional: Negative for fever or weight change.  Respiratory: Negative for cough and shortness of breath.   Cardiovascular: Negative for chest pain or palpitations.  Gastrointestinal: Negative for abdominal pain, no bowel changes.  Musculoskeletal: Negative for gait problem or joint swelling.  Skin: Negative for rash.  Neurological: Negative for dizziness or headache.  No other specific complaints in a complete review of systems (except as listed in HPI above).  Objective  Vitals:   08/10/17 1305  BP: 118/64  Pulse: 93  Resp: 16  Temp: 97.9 F (36.6 C)  SpO2: 98%  Weight: 187 lb 9 oz (85.1 kg)  Height:  (1.88 m)    Body mass index is 24.08 kg/m.  Physical Exam  Constitutional: Patient appears well-developed and well-nourished. No distress.  HEENT: head atraumatic, normocephalic, pupils equal and reactive to light, neck supple, throat within normal limits Cardiovascular: Normal rate, regular rhythm and normal heart sounds.  No murmur heard. No BLE edema. Pulmonary/Chest: Effort normal and breath sounds normal. No respiratory distress. Abdominal: Soft.  There is no tenderness. Psychiatric: Patient has a normal mood and affect. behavior is normal. Judgment and thought content normal.  PHQ2/9: Depression screen [name] 2/9 05/10/2017 02/07/2017 08/18/2016 05/26/2016 02/24/2016  Decreased Interest 0 0 0 0 0  Down, Depressed, Hopeless 0 0 0 0 0  PHQ - 2 Score 0 0 0 0 0     Fall Risk: Fall Risk  05/10/2017 02/07/2017 08/18/2016 05/26/2016 02/24/2016  Falls in the past year? No No No No No     Assessment & Plan  1. Chronic bilateral low  back pain without sciatica  - cyclobenzaprine (FLEXERIL) 10 MG tablet; Take 0.5-1 tablets (5-10 mg total) by mouth at bedtime.  Dispense: 90 tablet; Refill: 0  2. Panic attack  - ALPRAZolam (XANAX) 0.5 MG tablet; Take 1 tablet (0.5 mg total) by mouth as needed.  Dispense: 40 tablet; Refill: 0  3. Attention deficit hyperactivity disorder (ADHD), predominantly hyperactive type  - amphetamine-dextroamphetamine (ADDERALL XR) 25 MG 24 hr capsule; Take 1 capsule by mouth every morning.  Dispense: 30 capsule; Refill: 0 - amphetamine-dextroamphetamine (ADDERALL XR) 25 MG 24 hr capsule; Take 1 capsule by mouth every morning.  Dispense: 30 capsule; Refill: 0 - amphetamine-dextroamphetamine (ADDERALL XR) 25 MG 24 hr capsule; Take 1 capsule by mouth every morning.  Dispense: 30 capsule; Refill: 0 - amphetamine-dextroamphetamine (ADDERALL) 15 MG tablet; Take 1 tablet  by mouth daily as needed. At lunch-when working long hours  Dispense: 30 tablet; Refill: 0 - amphetamine-dextroamphetamine (ADDERALL) 15 MG tablet; Take 1 tablet by mouth daily.  Dispense: 30 tablet; Refill: 0 - amphetamine-dextroamphetamine (ADDERALL) 15 MG tablet; Take 1 tablet by mouth daily.  Dispense: 30 tablet; Refill: 0  4. Perennial allergic rhinitis with seasonal variation  - levocetirizine (XYZAL) 5 MG tablet; Take 1 tablet (5 mg total) by mouth every evening.  Dispense: 90 tablet; Refill: 0

## 2017-11-09 ENCOUNTER — Encounter: Payer: Self-pay | Admitting: Family Medicine

## 2017-11-09 ENCOUNTER — Ambulatory Visit: Payer: BLUE CROSS/BLUE SHIELD | Admitting: Family Medicine

## 2017-11-09 VITALS — BP 124/68 | HR 76 | Temp 98.6°F | Resp 18 | Ht 74.0 in | Wt 185.6 lb

## 2017-11-09 DIAGNOSIS — F410 Panic disorder [episodic paroxysmal anxiety] without agoraphobia: Secondary | ICD-10-CM | POA: Diagnosis not present

## 2017-11-09 DIAGNOSIS — Z79899 Other long term (current) drug therapy: Secondary | ICD-10-CM

## 2017-11-09 DIAGNOSIS — F901 Attention-deficit hyperactivity disorder, predominantly hyperactive type: Secondary | ICD-10-CM

## 2017-11-09 DIAGNOSIS — J302 Other seasonal allergic rhinitis: Secondary | ICD-10-CM

## 2017-11-09 DIAGNOSIS — M545 Low back pain: Secondary | ICD-10-CM | POA: Diagnosis not present

## 2017-11-09 DIAGNOSIS — G8929 Other chronic pain: Secondary | ICD-10-CM | POA: Diagnosis not present

## 2017-11-09 DIAGNOSIS — J3089 Other allergic rhinitis: Secondary | ICD-10-CM

## 2017-11-09 DIAGNOSIS — Z13220 Encounter for screening for lipoid disorders: Secondary | ICD-10-CM | POA: Diagnosis not present

## 2017-11-09 DIAGNOSIS — Z131 Encounter for screening for diabetes mellitus: Secondary | ICD-10-CM | POA: Diagnosis not present

## 2017-11-09 MED ORDER — AMPHETAMINE-DEXTROAMPHETAMINE 15 MG PO TABS: 15.0000 mg | ORAL_TABLET | Freq: Every day | ORAL | 0 refills | Status: DC

## 2017-11-09 MED ORDER — AMPHETAMINE-DEXTROAMPHET ER 25 MG PO CP24: 25.0000 mg | ORAL_CAPSULE | ORAL | 0 refills | Status: DC

## 2017-11-09 MED ORDER — LEVOCETIRIZINE DIHYDROCHLORIDE 5 MG PO TABS: 5.0000 mg | ORAL_TABLET | Freq: Every evening | ORAL | 1 refills | Status: DC

## 2017-11-09 MED ORDER — FLUTICASONE PROPIONATE 50 MCG/ACT NA SUSP: 2.0000 | Freq: Every day | NASAL | 1 refills | Status: DC

## 2017-11-09 MED ORDER — CYCLOBENZAPRINE HCL 10 MG PO TABS: 5.0000 mg | ORAL_TABLET | Freq: Every day | ORAL | 0 refills | Status: DC

## 2017-11-09 MED ORDER — ALPRAZOLAM 0.5 MG PO TABS: 0.5000 mg | ORAL_TABLET | ORAL | 0 refills | Status: DC | PRN

## 2017-11-09 MED ORDER — AMPHETAMINE-DEXTROAMPHETAMINE 15 MG PO TABS: 1.0000 | ORAL_TABLET | Freq: Every day | ORAL | 0 refills | Status: DC | PRN

## 2017-11-09 NOTE — Progress Notes (Signed)
Name: William Rivers   MRN: [id number]    DOB: 12/10/1990   Date:11/09/2017       Progress Note  Subjective  Chief Complaint  Chief Complaint  Patient presents with  . Medication Refill    3 month F/U  . Allergic Rhinitis     Sore throat  . Back Pain    Unchanged  . ADHD  . Anxiety    HPI  Chronic low back pain: he wakes up feeling very stiff in am's, he is takes Flexeril a few  times a week before bed time and helps him sleep better, and wakes up not feeling sitff. Works as an Personnel officerelectrician.  He states usually aching on lower back, but sometimes shoots down right leg, episodes of radiculitis are almost daily, but does not last long, he needs to change positions and resolves, symptoms waxes and wanes. Aggravated by sitting for a prolonged period of time. Pain right now is 3-4/10.   ADHD: William Rivers works long hours, about up to 14 hours per day, takes Adderal XR in am and a Adderal 15mg  around 2pm, it helps him stay focused and also helps him with anxiety, stops his mind from worrying His weight is stable. 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 Spoke to his girlfriend and she states that without the medication he cannot get anything done, he is not having side effects of medication ( denies tics or anxiety/aggression)   Anxiety: taking Alprazolam prn, 40pills for 90 days, he only takes it prn for panic attacks, he denies side effects. He is aware of risk of taking it and driving. Panic attacks have been stable, it happens in spurts, episodes of panic attacks about twice a week. We will try to wean him off slowly to a max of 30 pills for 90 days, we will change to 38 pills today   AR: he is stable, using nasal spray, and xyzal it helps with nasal congestion and rhinorrhea.   Patient Active Problem List   Diagnosis Date Noted  . ADD (attention deficit disorder) 05/29/2015  . Chronic LBP 05/29/2015  . Lactose intolerance  05/29/2015  . Panic attack 05/29/2015  . Perennial allergic rhinitis with seasonal variation 05/29/2015  . Lumbosacral radiculitis 05/29/2015    Past Surgical History:  Procedure Laterality Date  . TONSILLECTOMY      Family History  Problem Relation Age of Onset  . Anxiety disorder Mother     Social History   Socioeconomic History  . Marital status: Single    Spouse name: Not on file  . Number of children: Not on file  . Years of education: Not on file  . Highest education level: Not on file  Social Needs  . Financial resource strain: Not on file  . Food insecurity - worry: Not on file  . Food insecurity - inability: Not on file  . Transportation needs - medical: Not on file  . Transportation needs - non-medical: Not on file  Occupational History  . Not on file  Tobacco Use  . Smoking status: Never Smoker  . Smokeless tobacco: Current User    Types: Chew  Substance and Sexual Activity  . Alcohol use: Yes    Alcohol/week: 10.8 oz    Types: 12 Standard drinks or equivalent, 6 Cans of beer per week  . Drug use: No  . Sexual activity: Yes    Partners: Female  Other Topics Concern  . Not on file  Social  History Narrative   She is dating, living with girlfriend and the mother of one of her children   He has two older children from previous relationship.      Current Outpatient Medications:  .  ALPRAZolam (XANAX) 0.5 MG tablet, Take 1 tablet (0.5 mg total) by mouth as needed., Disp: 40 tablet, Rfl: 0 .  amphetamine-dextroamphetamine (ADDERALL XR) 25 MG 24 hr capsule, Take 1 capsule by mouth every morning., Disp: 30 capsule, Rfl: 0 .  amphetamine-dextroamphetamine (ADDERALL XR) 25 MG 24 hr capsule, Take 1 capsule by mouth every morning., Disp: 30 capsule, Rfl: 0 .  amphetamine-dextroamphetamine (ADDERALL XR) 25 MG 24 hr capsule, Take 1 capsule by mouth every morning., Disp: 30 capsule, Rfl: 0 .  amphetamine-dextroamphetamine (ADDERALL) 15 MG tablet, Take 1 tablet by  mouth daily as needed. At lunch-when working long hours, Disp: 30 tablet, Rfl: 0 .  amphetamine-dextroamphetamine (ADDERALL) 15 MG tablet, Take 1 tablet by mouth daily., Disp: 30 tablet, Rfl: 0 .  amphetamine-dextroamphetamine (ADDERALL) 15 MG tablet, Take 1 tablet by mouth daily., Disp: 30 tablet, Rfl: 0 .  cyclobenzaprine (FLEXERIL) 10 MG tablet, Take 0.5-1 tablets (5-10 mg total) by mouth at bedtime., Disp: 90 tablet, Rfl: 0 .  fluticasone (FLONASE) 50 MCG/ACT nasal spray, Place 2 sprays into both nostrils daily., Disp: 48 g, Rfl: 1 .  levocetirizine (XYZAL) 5 MG tablet, Take 1 tablet (5 mg total) by mouth every evening., Disp: 90 tablet, Rfl: 1  No Known Allergies   ROS  Constitutional: Negative for fever or significant  weight change.  Respiratory: Negative for cough and shortness of breath.   Cardiovascular: Negative for chest pain or palpitations.  Gastrointestinal: Negative for abdominal pain, no bowel changes.  Musculoskeletal: Negative for gait problem or joint swelling.  Skin: Negative for rash.  Neurological: Negative for dizziness or headache.  No other specific complaints in a complete review of systems (except as listed in HPI above).  Objective  Vitals:   11/09/17 1516  BP: 124/68  Pulse: 76  Resp: 18  Temp: 98.6 F (37 C)  TempSrc: Oral  SpO2: 98%  Weight: 185 lb 9.6 oz (84.2 kg)  Height: 6\' 2"  (1.88 m)    Body mass index is 23.83 kg/m.  Physical Exam  Constitutional: Patient appears well-developed and well-nourished. No distress.  HEENT: head atraumatic, normocephalic, pupils equal and reactive to light,  neck supple, throat within normal limits Cardiovascular: Normal rate, regular rhythm and normal heart sounds.  No murmur heard. No BLE edema. Pulmonary/Chest: Effort normal and breath sounds normal. No respiratory distress. Abdominal: Soft.  There is no tenderness. Psychiatric: Patient has a normal mood and affect. behavior is normal. Judgment and  thought content normal.   PH[facility]9: [facility] 11/09/2017 05/10/2017 02/07/2017 08/18/2016 05/26/2016  Decreased Interest 0 0 0 0 0  Down, Depressed, Hopeless 0 0 0 0 0  PHQ - 2 Score 0 0 0 0 0     Fall Risk: Fall Risk  11/09/2017 05/10/2017 02/07/2017 08/18/2016 05/26/2016  Falls in the past year? No No No No No    Functional Status Survey: Is the patient deaf or have difficulty hearing?: No Does the patient have difficulty seeing, even when wearing glasses/contacts?: No Does the patient have difficulty concentrating, remembering, or making decisions?: No Does the patient have difficulty walking or climbing stairs?: No Does the patient have difficulty dressing or bathing?: No Does the patient have difficulty doing errands alone such as visiting a doctor's office or  shopping?: No   Assessment & Plan  1. Attention deficit hyperactivity disorder (ADHD), predominantly hyperactive type  - amphetamine-dextroamphetamine (ADDERALL XR) 25 MG 24 hr capsule; Take 1 capsule by mouth every morning.  Dispense: 30 capsule; Refill: 0 - amphetamine-dextroamphetamine (ADDERALL XR) 25 MG 24 hr capsule; Take 1 capsule by mouth every morning.  Dispense: 30 capsule; Refill: 0 - amphetamine-dextroamphetamine (ADDERALL XR) 25 MG 24 hr capsule; Take 1 capsule by mouth every morning.  Dispense: 30 capsule; Refill: 0 - amphetamine-dextroamphetamine (ADDERALL) 15 MG tablet; Take 1 tablet by mouth daily as needed. At lunch-when working long hours  Dispense: 30 tablet; Refill: 0 - amphetamine-dextroamphetamine (ADDERALL) 15 MG tablet; Take 1 tablet by mouth daily.  Dispense: 30 tablet; Refill: 0 - amphetamine-dextroamphetamine (ADDERALL) 15 MG tablet; Take 1 tablet by mouth daily.  Dispense: 30 tablet; Refill: 0  2. Chronic bilateral low back pain without sciatica  - cyclobenzaprine (FLEXERIL) 10 MG tablet; Take 0.5-1 tablets (5-10 mg total) by mouth at bedtime.  Dispense: 90 tablet; Refill: 0  3.  Perennial allergic rhinitis with seasonal variation  - levocetirizine (XYZAL) 5 MG tablet; Take 1 tablet (5 mg total) by mouth every evening.  Dispense: 90 tablet; Refill: 1 - fluticasone (FLONASE) 50 MCG/ACT nasal spray; Place 2 sprays into both nostrils daily.  Dispense: 48 g; Refill: 1  4. Long-term use of high-risk medication * - CBC with Differential/Platelet - COMPLETE METABOLIC PANEL WITH GFR  5. Lipid screening  - Lipid panel  6. Diabetes mellitus screening  - Hemoglobin A1c   7. Panic attack  - ALPRAZolam (XANAX) 0.5 MG tablet; Take 1 tablet (0.5 mg total) by mouth as needed.  Dispense: 38 tablet; Refill: 0

## 2017-11-13 LAB — HEMOGLOBIN A1C
EAG (MMOL/L): 5.7 (calc)
HEMOGLOBIN A1C: 5.2 %{Hb} (ref <5.7)
Mean Plasma Glucose: 103 (calc)

## 2017-11-13 LAB — CBC WITH DIFFERENTIAL/PLATELET
BASOS ABS: 40 {cells}/uL (ref 0–200)
BASOS PCT: 0.5 %
EOS ABS: 80 {cells}/uL (ref 15–500)
Eosinophils Relative: 1 %
HEMATOCRIT: 37.5 % — AB (ref 38.5–50.0)
HEMOGLOBIN: 12.8 g/dL — AB (ref 13.2–17.1)
LYMPHS ABS: 1128 {cells}/uL (ref 850–3900)
MCH: 29.7 pg (ref 27.0–33.0)
MCHC: 34.1 g/dL (ref 32.0–36.0)
MCV: 87 fL (ref 80.0–100.0)
MONOS PCT: 9.3 %
MPV: 10.5 fL (ref 7.5–12.5)
NEUTROS ABS: 6008 {cells}/uL (ref 1500–7800)
Neutrophils Relative %: 75.1 %
Platelets: 258 10*3/uL (ref 140–400)
RBC: 4.31 10*6/uL (ref 4.20–5.80)
RDW: 11.4 % (ref 11.0–15.0)
Total Lymphocyte: 14.1 %
WBC mixed population: 744 cells/uL (ref 200–950)
WBC: 8 10*3/uL (ref 3.8–10.8)

## 2017-11-13 LAB — B12 AND FOLATE PANEL
FOLATE: 7.8 ng/mL
VITAMIN B 12: 277 pg/mL (ref 200–1100)

## 2017-11-13 LAB — IRON,TIBC AND FERRITIN PANEL
%SAT: 16 % (calc) (ref 15–60)
FERRITIN: 140 ng/mL (ref 20–345)
IRON: 51 ug/dL (ref 50–195)
TIBC: 319 mcg/dL (calc) (ref 250–425)

## 2017-11-13 LAB — LIPID PANEL
CHOL/HDL RATIO: 3 (calc) (ref <5.0)
Cholesterol: 133 mg/dL (ref <200)
HDL: 45 mg/dL (ref >40)
LDL CHOLESTEROL (CALC): 73 mg/dL
NON-HDL CHOLESTEROL (CALC): 88 mg/dL (ref <130)
Triglycerides: 66 mg/dL (ref <150)

## 2017-11-13 LAB — TEST AUTHORIZATION

## 2017-11-13 LAB — COMPLETE METABOLIC PANEL WITH GFR
AG Ratio: 2.1 (calc) (ref 1.0–2.5)
ALBUMIN MSPROF: 4.4 g/dL (ref 3.6–5.1)
ALT: 17 U/L (ref 9–46)
AST: 15 U/L (ref 10–40)
Alkaline phosphatase (APISO): 64 U/L (ref 40–115)
BILIRUBIN TOTAL: 0.4 mg/dL (ref 0.2–1.2)
BUN: 14 mg/dL (ref 7–25)
CO2: 29 mmol/L (ref 20–32)
CREATININE: 1.19 mg/dL (ref 0.60–1.35)
Calcium: 9.1 mg/dL (ref 8.6–10.3)
Chloride: 104 mmol/L (ref 98–110)
GFR, EST AFRICAN AMERICAN: 96 mL/min/{1.73_m2} (ref >=60)
GFR, Est Non African American: 83 mL/min/{1.73_m2} (ref >=60)
GLOBULIN: 2.1 g/dL (ref 1.9–3.7)
GLUCOSE: 74 mg/dL (ref 65–99)
Potassium: 4.1 mmol/L (ref 3.5–5.3)
SODIUM: 141 mmol/L (ref 135–146)
TOTAL PROTEIN: 6.5 g/dL (ref 6.1–8.1)

## 2017-11-21 ENCOUNTER — Telehealth: Payer: Self-pay

## 2017-11-21 NOTE — Telephone Encounter (Signed)
Copied from CRM [id number]. Topic: Quick Communication - Lab Results >> Nov 15, 2017 11:54 AM Tommie RaymondBooker, Crystal L, CMA wrote: Called patient to inform them of 11/12/2017 lab results. When patient returns call, triage nurse may disclose results. He does not have iron deficiency anemia, however his B12 level is low  B12 is low, needs to either take otc B12 1000 mg SL daily or come in for B12 shots month. Please let me know if he will come in for B-12 injection or if he is going to do a otc supplement daily.

## 2017-11-22 ENCOUNTER — Encounter: Payer: Self-pay | Admitting: Emergency Medicine

## 2017-11-22 ENCOUNTER — Other Ambulatory Visit: Payer: Self-pay

## 2017-11-22 ENCOUNTER — Emergency Department
Admission: EM | Admit: 2017-11-22 | Discharge: 2017-11-22 | Disposition: A | Discharge status: Home / Self Care | Payer: BLUE CROSS/BLUE SHIELD | Attending: Emergency Medicine | Admitting: Emergency Medicine

## 2017-11-22 DIAGNOSIS — Z79899 Other long term (current) drug therapy: Secondary | ICD-10-CM | POA: Diagnosis not present

## 2017-11-22 DIAGNOSIS — K6289 Other specified diseases of anus and rectum: Secondary | ICD-10-CM | POA: Diagnosis not present

## 2017-11-22 DIAGNOSIS — K625 Hemorrhage of anus and rectum: Secondary | ICD-10-CM | POA: Diagnosis not present

## 2017-11-22 DIAGNOSIS — F17228 Nicotine dependence, chewing tobacco, with other nicotine-induced disorders: Secondary | ICD-10-CM | POA: Diagnosis not present

## 2017-11-22 DIAGNOSIS — K602 Anal fissure, unspecified: Secondary | ICD-10-CM | POA: Diagnosis not present

## 2017-11-22 DIAGNOSIS — K5900 Constipation, unspecified: Secondary | ICD-10-CM | POA: Insufficient documentation

## 2017-11-22 DIAGNOSIS — R197 Diarrhea, unspecified: Secondary | ICD-10-CM | POA: Diagnosis not present

## 2017-11-22 MED ORDER — LIDOCAINE 5 % EX OINT: 1.0000 "application " | TOPICAL_OINTMENT | CUTANEOUS | 0 refills | Status: DC | PRN

## 2017-11-22 MED ORDER — DOCUSATE SODIUM 100 MG PO CAPS: 100.0000 mg | ORAL_CAPSULE | Freq: Every day | ORAL | 2 refills | Status: DC | PRN

## 2017-11-22 NOTE — ED Provider Notes (Signed)
[facility] Emergency Department Provider Note  ____________________________________________  Time seen: Approximately 3:54 PM  I have reviewed the triage vital signs and the nursing notes.   HISTORY  Chief Complaint Rectal Pain    HPI William Rivers is a 27 y.o. male who presents emergency department complaining of rectal pain and mild rectal bleeding.  Patient reports that 2 days ago he developed some rectal pain.  Patient reports that it is worse with bowel movements.  Patient reports that it is a sharp/aching sensation to the rectum.  No history of same.  No direct trauma to the area.  Patient reports that he recently had a GI illness and had some mild constipation and then diarrhea.  Patient reports that he had strained using the restroom prior with illness.  Illness resolves approximately 5 days ago.  Patient does have a history of chronic GI upset but states that he has never seen a GI specialist.  He reports that dairy, spicy foods typically upset his stomach and cause cramping and some diarrhea.  Patient is never been diagnosed with Crohn's or colitis.  Patient states that rectal bleeding is bright red, and only a few drops.  Patient is tried preparation H which helps somewhat but does not fully alleviate symptoms.  No other complaints at this time.  Patient denies any abdominal pain, nausea vomiting, fevers or chills.  No diarrhea or constipation.  Past Medical History:  Diagnosis Date  . ADD (attention deficit disorder)   . Allergy   . Anxiety   . Back pain     Patient Active Problem List   Diagnosis Date Noted  . ADD (attention deficit disorder) 05/29/2015  . Chronic LBP 05/29/2015  . Lactose intolerance 05/29/2015  . Panic attack 05/29/2015  . Perennial allergic rhinitis with seasonal variation 05/29/2015  . Lumbosacral radiculitis 05/29/2015    Past Surgical History:  Procedure Laterality Date  . TONSILLECTOMY      Prior to Admission  medications   Medication Sig Start Date End Date Taking? Authorizing Provider  ALPRAZolam Prudy Feeler(XANAX) 0.5 MG tablet Take 1 tablet (0.5 mg total) by mouth as needed. 11/09/17   Alba CorySowles, Krichna, MD  amphetamine-dextroamphetamine (ADDERALL XR) 25 MG 24 hr capsule Take 1 capsule by mouth every morning. 11/09/17   Alba CorySowles, Krichna, MD  amphetamine-dextroamphetamine (ADDERALL XR) 25 MG 24 hr capsule Take 1 capsule by mouth every morning. 11/09/17   Alba CorySowles, Krichna, MD  amphetamine-dextroamphetamine (ADDERALL XR) 25 MG 24 hr capsule Take 1 capsule by mouth every morning. 11/09/17   Alba CorySowles, Krichna, MD  amphetamine-dextroamphetamine (ADDERALL) 15 MG tablet Take 1 tablet by mouth daily as needed. At lunch-when working long hours 11/09/17   Alba CorySowles, Krichna, MD  amphetamine-dextroamphetamine (ADDERALL) 15 MG tablet Take 1 tablet by mouth daily. 11/09/17   Alba CorySowles, Krichna, MD  amphetamine-dextroamphetamine (ADDERALL) 15 MG tablet Take 1 tablet by mouth daily. 11/09/17   Alba CorySowles, Krichna, MD  cyclobenzaprine (FLEXERIL) 10 MG tablet Take 0.5-1 tablets (5-10 mg total) by mouth at bedtime. 11/09/17   Alba CorySowles, Krichna, MD  docusate sodium (COLACE) 100 MG capsule Take 1 capsule (100 mg total) by mouth daily as needed. 11/22/17 11/22/18  Yehudit Fulginiti, Delorise RoyalsJonathan D, PA-C  fluticasone (FLONASE) 50 MCG/ACT nasal spray Place 2 sprays into both nostrils daily. 11/09/17   Alba CorySowles, Krichna, MD  levocetirizine (XYZAL) 5 MG tablet Take 1 tablet (5 mg total) by mouth every evening. 11/09/17   Alba CorySowles, Krichna, MD  lidocaine (XYLOCAINE) 5 % ointment Apply 1 application topically as  needed. 11/22/17   Lucille Witts, Delorise Royals, PA-C    Allergies Patient has no known allergies.  Family History  Problem Relation Age of Onset  . Anxiety disorder Mother     Social History Social History   Tobacco Use  . Smoking status: Never Smoker  . Smokeless tobacco: Current User    Types: Chew  Substance Use Topics  . Alcohol use: Yes    Alcohol/week:  10.8 oz    Types: 6 Cans of beer, 12 Standard drinks or equivalent per week  . Drug use: No     Review of Systems  Constitutional: No fever/chills Eyes: No visual changes.  Cardiovascular: no chest pain. Respiratory: no cough. No SOB. Gastrointestinal: No abdominal pain.  No nausea, no vomiting.  No diarrhea.  No constipation.  Positive for rectal pain.  Positive for rectal bleeding.   Musculoskeletal: Negative for musculoskeletal pain. Skin: Negative for rash, abrasions, lacerations, ecchymosis. Neurological: Negative for headaches, focal weakness or numbness. 10-point ROS otherwise negative.  ____________________________________________   PHYSICAL EXAM:  VITAL SIGNS: ED Triage Vitals  Enc Vitals Group     BP 11/22/17 1526 (!) 147/68     Pulse Rate 11/22/17 1526 (!) 105     Resp 11/22/17 1526 18     Temp 11/22/17 1526 98.8 F (37.1 C)     Temp Source 11/22/17 1526 Oral     SpO2 11/22/17 1526 99 %     Weight 11/22/17 1526 185 lb (83.9 kg)     Height 11/22/17 1526 6\' 2"  (1.88 m)     Head Circumference --      Peak Flow --      Pain Score 11/22/17 1525 8     Pain Loc --      Pain Edu? --      Excl. in GC? --      Constitutional: Alert and oriented. Well appearing and in no acute distress. Eyes: Conjunctivae are normal. PERRL. EOMI. Head: Atraumatic. Neck: No stridor.    Cardiovascular: Normal rate, regular rhythm. Normal S1 and S2.  Good peripheral circulation. Respiratory: Normal respiratory effort without tachypnea or retractions. Lungs CTAB. Good air entry to the bases with no decreased or absent breath sounds. Gastrointestinal: Bowel sounds 4 quadrants. Soft and nontender to palpation. No guarding or rigidity. No palpable masses. No distention. No CVA tenderness.  Rectal exam reveals fissure noted to the anterior aspect.  No bleeding.  Area is very tender to palpation.  No visualization or palpation of hemorrhoids on exam. Musculoskeletal: Full range of motion to  all extremities. No gross deformities appreciated. Neurologic:  Normal speech and language. No gross focal neurologic deficits are appreciated.  Skin:  Skin is warm, dry and intact. No rash noted. Psychiatric: Mood and affect are normal. Speech and behavior are normal. Patient exhibits appropriate insight and judgement.   ____________________________________________   LABS (all labs ordered are listed, but only abnormal results are displayed)  Labs Reviewed - No data to display ____________________________________________  EKG   ____________________________________________  RADIOLOGY   No results found.  ____________________________________________    PROCEDURES  Procedure(s) performed:    Procedures    Medications - No data to display   ____________________________________________   INITIAL IMPRESSION / ASSESSMENT AND PLAN / ED COURSE  Pertinent labs & imaging results that were available during my care of the patient were reviewed by me and considered in my medical decision making (see chart for details).  Review of the [place] CSRS was performed in  accordance of the [facility] prior to dispensing any controlled drugs.     Patient's diagnosis is consistent with anal fissure.  Patient recently had a GI illness with chronic GI problems.  He is never been diagnosed with colitis or Crohn's but does have chronic abdominal cramping and intermittent diarrhea.  Fissure is likely secondary to recent GI illness versus chronic GI complaints.  Patient will be treated with stool softener, Xylocaine topical.  Patient is advised to follow-up with GI for both this complaint as well as ongoing chronic GI issues.  No indication for labs or imaging at this time.  Initial differential included rectal prolapse, anal fissure, perirectal abscess, hemorrhoids.  No indication of cellulitis or abscess in the perirectal region.  No prolapse.  No visible hemorrhoids.  Fissure is appreciated.. Patient  will be discharged home with prescriptions for docusate and Xylocaine topical. Patient is to follow up with GI as needed or otherwise directed. Patient is given ED precautions to return to the ED for any worsening or new symptoms.     ____________________________________________  FINAL CLINICAL IMPRESSION(S) / ED DIAGNOSES  Final diagnoses:  Anal fissure      NEW MEDICATIONS STARTED DURING THIS VISIT:  ED Discharge Orders        Ordered    docusate sodium (COLACE) 100 MG capsule  Daily PRN     11/22/17 1612    lidocaine (XYLOCAINE) 5 % ointment  As needed     11/22/17 1612          This chart was dictated using voice recognition software/Dragon. Despite best efforts to proofread, errors can occur which can change the meaning. Any change was purely unintentional.    Racheal Patches, PA-C 11/22/17 1623    Don Perking, [place], MD 11/25/17 [phone]

## 2017-11-22 NOTE — ED Triage Notes (Signed)
Pt presents via pov from home with rectal pain since last night. Pt states he has never had anything like this before. He reports no difficulty with bowel movements, but has noticed some blood in the toilet after bowel movements. Pt alert & oriented with NAD noted.

## 2017-11-22 NOTE — ED Notes (Signed)
Pt ambulatory upon discharge. Verbalized understanding of discharge instructions, follow-up care and prescriptions. A&O x4. Skin warm and dry.

## 2018-01-02 ENCOUNTER — Encounter: Payer: BLUE CROSS/BLUE SHIELD | Admitting: Family Medicine

## 2018-01-11 ENCOUNTER — Ambulatory Visit (INDEPENDENT_AMBULATORY_CARE_PROVIDER_SITE_OTHER): Payer: BLUE CROSS/BLUE SHIELD | Admitting: Family Medicine

## 2018-01-11 ENCOUNTER — Encounter: Payer: Self-pay | Admitting: Family Medicine

## 2018-01-11 VITALS — BP 130/44 | HR 103 | Temp 98.4°F | Resp 14 | Ht 71.65 in | Wt 184.8 lb

## 2018-01-11 DIAGNOSIS — Z0001 Encounter for general adult medical examination with abnormal findings: Secondary | ICD-10-CM

## 2018-01-11 DIAGNOSIS — E538 Deficiency of other specified B group vitamins: Secondary | ICD-10-CM | POA: Diagnosis not present

## 2018-01-11 DIAGNOSIS — F901 Attention-deficit hyperactivity disorder, predominantly hyperactive type: Secondary | ICD-10-CM

## 2018-01-11 DIAGNOSIS — F410 Panic disorder [episodic paroxysmal anxiety] without agoraphobia: Secondary | ICD-10-CM

## 2018-01-11 DIAGNOSIS — Z0000 Encounter for general adult medical examination without abnormal findings: Secondary | ICD-10-CM

## 2018-01-11 DIAGNOSIS — D649 Anemia, unspecified: Secondary | ICD-10-CM

## 2018-01-11 MED ORDER — AMPHETAMINE-DEXTROAMPHET ER 25 MG PO CP24
25.0000 mg | ORAL_CAPSULE | ORAL | 0 refills | Status: DC
Start: 2018-01-11 — End: 2018-05-29

## 2018-01-11 MED ORDER — AMPHETAMINE-DEXTROAMPHET ER 25 MG PO CP24: 25.0000 mg | ORAL_CAPSULE | ORAL | 0 refills | Status: DC

## 2018-01-11 MED ORDER — AMPHETAMINE-DEXTROAMPHETAMINE 15 MG PO TABS: 1.0000 | ORAL_TABLET | Freq: Every day | ORAL | 0 refills | Status: DC | PRN

## 2018-01-11 MED ORDER — AMPHETAMINE-DEXTROAMPHETAMINE 15 MG PO TABS: 15.0000 mg | ORAL_TABLET | Freq: Every day | ORAL | 0 refills | Status: DC

## 2018-01-11 NOTE — Progress Notes (Signed)
Name: William Rivers   MRN: [id number]    DOB: December 21, 1990   Date:01/11/2018       Progress Note  Subjective  Chief Complaint  Chief Complaint  Patient presents with  . Annual Exam    HPI  Patient presents for annual CPE    ADHD: William Rivers works long hours, about up to 14 hours per day, takes Adderal XR in am and a Adderal 15mg  around 2pm, it helps him stay focused and also helps him with anxiety, stops his mind from worrying His weight is stable. 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   Anemia/B12 deficiency: he has noticed more fatigue over the past year, no SOB or palpitation. Denies pica. He has noticed tingling on both hands. 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 - Phalen's and Tinnel's negative   USPSTF grade A and B recommendations:  Diet: eats fast food 7 times a week  Exercise: active at work, but not exercising   Depression:  Depression screen [facility] 2/9 01/11/2018 11/09/2017 05/10/2017 02/07/2017 08/18/2016  Decreased Interest 0 0 0 0 0  Down, Depressed, Hopeless 0 0 0 0 0  PHQ - 2 Score 0 0 0 0 0    Hypertension:  BP Readings from Last 3 Encounters:  01/11/18 (!) 130/44  11/22/17 (!) 147/68  11/09/17 124/68    Obesity: Wt Readings from Last 3 Encounters:  01/11/18 184 lb 12.8 oz (83.8 kg)  11/22/17 185 lb (83.9 kg)  11/09/17 185 lb 9.6 oz (84.2 kg)   BMI Readings from Last 3 Encounters:  01/11/18 25.31 kg/m  11/22/17 23.75 kg/m  11/09/17 23.83 kg/m     Lipids:  Lab Results  Component Value Date   CHOL 133 11/09/2017   Lab Results  Component Value Date   HDL 45 11/09/2017   No results found for: [facility] Lab Results  Component Value Date   TRIG 66 11/09/2017   Lab Results  Component Value Date   CHOLHDL 3.0 11/09/2017   No results found for: LDLDIRECT Glucose:  Glucose, Bld  Date Value Ref Range Status   11/09/2017 74 65 - 99 mg/dL Final    Comment:    .            Fasting reference interval .     Alcohol: beer - at most 6 pack  Tobacco use: discussed importance of quitting  Significant Other STD testing and prevention (chl/gon/syphilis): declined HIV, hep C: not interested   IPSS Questionnaire (AUA-7): Over the past month.   1)  How often have you had a sensation of not emptying your bladder completely after you finish urinating?  0 - Not at all  2)  How often have you had to urinate again less than two hours after you finished urinating? 1 - Less than 1 time in 5  3)  How often have you found you stopped and started again several times when you urinated?  0 - Not at all  4) How difficult have you found it to postpone urination?  0 - Not at all  5) How often have you had a weak urinary stream?  0 - Not at all  6) How often have you had to push or strain to begin urination?  0 - Not at all  7) How many times did you most typically get up to urinate from the time you  went to bed until the time you got up in the morning?  0 - None  Total score:  0-7 mildly symptomatic   8-19 moderately symptomatic   20-35 severely symptomatic     Advanced Care Planning: A voluntary discussion about advance care planning including the explanation and discussion of advance directives.  Discussed health care proxy and Living will, and the patient was able to identify a health care proxy as mother Patient does have a living will at present time. If patient does have living will, I have requested they bring this to the clinic to be scanned in to their chart.  Patient Active Problem List   Diagnosis Date Noted  . ADD (attention deficit disorder) 05/29/2015  . Chronic LBP 05/29/2015  . Lactose intolerance 05/29/2015  . Panic attack 05/29/2015  . Perennial allergic rhinitis with seasonal variation 05/29/2015  . Lumbosacral radiculitis 05/29/2015    Past Surgical History:  Procedure Laterality Date   . TONSILLECTOMY      Family History  Problem Relation Age of Onset  . Anxiety disorder Mother     Social History   Socioeconomic History  . Marital status: Significant Other    Spouse name: William Rivers   . Number of children: 3  . Years of education: Not on file  . Highest education level: 12th grade  Social Needs  . Financial resource strain: Somewhat hard  . Food insecurity - worry: Never true  . Food insecurity - inability: Never true  . Transportation needs - medical: No  . Transportation needs - non-medical: No  Occupational History  . Occupation: Personnel officer   Tobacco Use  . Smoking status: Never Smoker  . Smokeless tobacco: Current User    Types: Chew  Substance and Sexual Activity  . Alcohol use: Yes    Alcohol/week: 10.8 oz    Types: 6 Cans of beer, 12 Standard drinks or equivalent per week  . Drug use: No  . Sexual activity: Yes    Partners: Female    Birth control/protection: IUD  Other Topics Concern  . Not on file  Social History Narrative   Living with girlfriend - William Rivers she is mother of one of his kids ( son)    He has two older children from previous relationship.      Current Outpatient Medications:  .  ALPRAZolam (XANAX) 0.5 MG tablet, Take 1 tablet (0.5 mg total) by mouth as needed., Disp: 38 tablet, Rfl: 0 .  amphetamine-dextroamphetamine (ADDERALL XR) 25 MG 24 hr capsule, Take 1 capsule by mouth every morning., Disp: 30 capsule, Rfl: 0 .  amphetamine-dextroamphetamine (ADDERALL XR) 25 MG 24 hr capsule, Take 1 capsule by mouth every morning., Disp: 30 capsule, Rfl: 0 .  amphetamine-dextroamphetamine (ADDERALL XR) 25 MG 24 hr capsule, Take 1 capsule by mouth every morning., Disp: 30 capsule, Rfl: 0 .  amphetamine-dextroamphetamine (ADDERALL) 15 MG tablet, Take 1 tablet by mouth daily as needed. At lunch-when working long hours, Disp: 30 tablet, Rfl: 0 .  amphetamine-dextroamphetamine (ADDERALL) 15 MG tablet, Take 1 tablet by mouth daily., Disp:  30 tablet, Rfl: 0 .  amphetamine-dextroamphetamine (ADDERALL) 15 MG tablet, Take 1 tablet by mouth daily., Disp: 30 tablet, Rfl: 0 .  cyclobenzaprine (FLEXERIL) 10 MG tablet, Take 0.5-1 tablets (5-10 mg total) by mouth at bedtime., Disp: 90 tablet, Rfl: 0 .  fluticasone (FLONASE) 50 MCG/ACT nasal spray, Place 2 sprays into both nostrils daily., Disp: 48 g, Rfl: 1 .  levocetirizine (XYZAL) 5 MG tablet, Take 1  tablet (5 mg total) by mouth every evening., Disp: 90 tablet, Rfl: 1  No Known Allergies   ROS   Constitutional: Negative for fever or weight change.  Respiratory: Negative for cough and shortness of breath.   Cardiovascular: Negative for chest pain or palpitations.  Gastrointestinal: Negative for abdominal pain, no bowel changes.  Musculoskeletal: Negative for gait problem or joint swelling.  Skin: Negative for rash.  Neurological: Negative for dizziness or headache.  No other specific complaints in a complete review of systems (except as listed in HPI above).   Objective  Vitals:   01/11/18 1410  BP: (!) 130/44  Pulse: (!) 103  Resp: 14  Temp: 98.4 F (36.9 C)  TempSrc: Oral  SpO2: 98%  Weight: 184 lb 12.8 oz (83.8 kg)  Height: 5' 11.65" (1.82 m)    Body mass index is 25.31 kg/m.  Physical Exam  Constitutional: Patient appears well-developed and well-nourished. No distress.  HENT: Head: Normocephalic and atraumatic. Ears: B TMs ok, no erythema or effusion; Nose: Nose normal. Mouth/Throat: Oropharynx is clear and moist. No oropharyngeal exudate.  Eyes: Conjunctivae and EOM are normal. Pupils are equal, round, and reactive to light. No scleral icterus.  Neck: Normal range of motion. Neck supple. No JVD present. No thyromegaly present.  Cardiovascular: Normal rate, regular rhythm and normal heart sounds.  No murmur heard. No BLE edema. Pulmonary/Chest: Effort normal and breath sounds normal. No respiratory distress. Abdominal: Soft. Bowel sounds are normal, no  distension. There is no tenderness. no masses MALE GENITALIA: Normal descended testes bilaterally, no masses palpated, no hernias, no lesions, no discharge RECTAL:not done  Musculoskeletal: Normal range of motion, no joint effusions. No gross deformities Neurological: he is alert and oriented to person, place, and time. No cranial nerve deficit. Coordination, balance, strength, speech and gait are normal.  Skin: Skin is warm and dry. No rash noted. No erythema.  Psychiatric: Patient has a normal mood and affect. behavior is normal. Judgment and thought content normal.  Recent Results (from the past 2160 hour(s))  CBC with Differential/Platelet     Status: Abnormal   Collection Time: 11/09/17  3:46 PM  Result Value Ref Range   WBC 8.0 3.8 - 10.8 Thousand/uL   RBC 4.31 4.20 - 5.80 Million/uL   Hemoglobin 12.8 (L) 13.2 - 17.1 g/dL   HCT 75.6 (L) 43.3 - 29.5 %   MCV 87.0 80.0 - 100.0 fL   MCH 29.7 27.0 - 33.0 pg   MCHC 34.1 32.0 - 36.0 g/dL   RDW 18.8 41.6 - 60.6 %   Platelets 258 140 - 400 Thousand/uL   MPV 10.5 7.5 - 12.5 fL   Neutro Abs 6,008 1,500 - 7,800 cells/uL   Lymphs Abs 1,128 850 - 3,900 cells/uL   WBC mixed population 744 200 - 950 cells/uL   Eosinophils Absolute 80 15 - 500 cells/uL   Basophils Absolute 40 0 - 200 cells/uL   Neutrophils Relative % 75.1 %   Total Lymphocyte 14.1 %   Monocytes Relative 9.3 %   Eosinophils Relative 1.0 %   Basophils Relative 0.5 %  COMPLETE METABOLIC PANEL WITH GFR     Status: None   Collection Time: 11/09/17  3:46 PM  Result Value Ref Range   Glucose, Bld 74 65 - 99 mg/dL    Comment: .            Fasting reference interval .    BUN 14 7 - 25 mg/dL   Creat 3.01 6.01 -  1.35 mg/dL   GFR, Est Non African American 83 > OR = 60 mL/min/1.25m2   GFR, Est African American 96 > OR = 60 mL/min/1.46m2   BUN/Creatinine Ratio NOT APPLICABLE 6 - 22 (calc)   Sodium 141 135 - 146 mmol/L   Potassium 4.1 3.5 - 5.3 mmol/L   Chloride 104 98 - 110  mmol/L   CO2 29 20 - 32 mmol/L   Calcium 9.1 8.6 - 10.3 mg/dL   Total Protein 6.5 6.1 - 8.1 g/dL   Albumin 4.4 3.6 - 5.1 g/dL   Globulin 2.1 1.9 - 3.7 g/dL (calc)   AG Ratio 2.1 1.0 - 2.5 (calc)   Total Bilirubin 0.4 0.2 - 1.2 mg/dL   Alkaline phosphatase (APISO) 64 40 - 115 U/L   AST 15 10 - 40 U/L   ALT 17 9 - 46 U/L  Lipid panel     Status: None   Collection Time: 11/09/17  3:46 PM  Result Value Ref Range   Cholesterol 133 <200 mg/dL   HDL 45 >16 mg/dL   Triglycerides 66 <109 mg/dL   LDL Cholesterol (Calc) 73 mg/dL (calc)    Comment: Reference range: <100 . Desirable range <100 mg/dL for primary prevention;   <70 mg/dL for patients with CHD or diabetic patients  with > or = 2 CHD risk factors. Marland Kitchen LDL-C is now calculated using the Martin-Hopkins  calculation, which is a validated novel method providing  better accuracy than the Friedewald equation in the  estimation of LDL-C.  Horald Pollen et al. Lenox Ahr. 6045;409(81): 2061-2068  (http://education.[facility].com/faq/FAQ164)    Total CHOL/HDL Ratio 3.0 <5.0 (calc)   Non-HDL Cholesterol (Calc) 88 <191 mg/dL (calc)    Comment: For patients with diabetes plus 1 major ASCVD risk  factor, treating to a non-HDL-C goal of <100 mg/dL  (LDL-C of <47 mg/dL) is considered a therapeutic  option.   Hemoglobin A1c     Status: None   Collection Time: 11/09/17  3:46 PM  Result Value Ref Range   Hgb A1c MFr Bld 5.2 <5.7 % of total Hgb    Comment: For the purpose of screening for the presence of diabetes: . <5.7%       Consistent with the absence of diabetes 5.7-6.4%    Consistent with increased risk for diabetes             (prediabetes) > or =6.5%  Consistent with diabetes . This assay result is consistent with a decreased risk of diabetes. . Currently, no consensus exists regarding use of hemoglobin A1c for diagnosis of diabetes in children. . According to American Diabetes Association (ADA) guidelines, hemoglobin A1c <7.0%  represents optimal control in non-pregnant diabetic patients. Different metrics may apply to specific patient populations.  Standards of Medical Care in Diabetes(ADA). .    Mean Plasma Glucose 103 (calc)   eAG (mmol/L) 5.7 (calc)  Iron, TIBC and Ferritin Panel     Status: None   Collection Time: 11/09/17  3:46 PM  Result Value Ref Range   Iron 51 50 - 195 mcg/dL   TIBC 829 562 - 130 mcg/dL (calc)   %SAT 16 15 - 60 % (calc)   Ferritin 140 20 - 345 ng/mL  B12 and Folate Panel     Status: None   Collection Time: 11/09/17  3:46 PM  Result Value Ref Range   Vitamin B-12 277 200 - 1,100 pg/mL    Comment: . Please Note: Although the reference range for vitamin B12 is  312 396 8559 pg/mL, it has been reported that between 5 and 10% of patients with values between 200 and 400 pg/mL may experience neuropsychiatric and hematologic abnormalities due to occult B12 deficiency; less than 1% of patients with values above 400 pg/mL will have symptoms. .    Folate 7.8 ng/mL    Comment:                            Reference Range                            Low:           <3.4                            Borderline:    3.4-5.4                            Normal:        >5.4 .   TEST AUTHORIZATION     Status: None   Collection Time: 11/09/17  3:46 PM  Result Value Ref Range   TEST NAME: IRON, TIBC AND FERRITIN PANEL    TEST CODE: [id number]    CLIENT CONTACT: [place] RAY    REPORT ALWAYS MESSAGE SIGNATURE      Comment: . The laboratory testing on this patient was verbally requested or confirmed by the ordering physician or his or her authorized representative after contact with an employee of [facility]. Federal regulations require that we maintain on file written authorization for all laboratory testing.  Accordingly we are asking that the ordering physician or his or her authorized representative sign a copy of this report and promptly return it to the client service  representative. . . Signature:____________________________________________________ . Please fax this signed page to [phone] or return it via your [facility] courier.       PHQ2/9: [facility] 01/11/2018 11/09/2017 05/10/2017 02/07/2017 08/18/2016  Decreased Interest 0 0 0 0 0  Down, Depressed, Hopeless 0 0 0 0 0  PHQ - 2 Score 0 0 0 0 0     Fall Risk: Fall Risk  01/11/2018 11/09/2017 05/10/2017 02/07/2017 08/18/2016  Falls in the past year? No No No No No     Functional Status Survey: Is the patient deaf or have difficulty hearing?: No Does the patient have difficulty seeing, even when wearing glasses/contacts?: No Does the patient have difficulty concentrating, remembering, or making decisions?: No Does the patient have difficulty walking or climbing stairs?: No Does the patient have difficulty dressing or bathing?: No Does the patient have difficulty doing errands alone such as visiting a doctor's office or shopping?: No    Assessment & Plan  1. Encounter for routine history and physical exam for male  Discussed importance of 150 minutes of physical activity weekly, eat two servings of fish weekly, eat one serving of tree nuts ( cashews, pistachios, pecans, almonds.Marland Kitchen.) every other day, eat 6 servings of fruit/vegetables daily and drink plenty of water and avoid sweet beverages.   2. Attention deficit hyperactivity disorder (ADHD), predominantly hyperactive type  - amphetamine-dextroamphetamine (ADDERALL XR) 25 MG 24 hr capsule; Take 1 capsule by mouth every morning.  Dispense: 30 capsule; Refill: 0 - amphetamine-dextroamphetamine (ADDERALL XR) 25 MG 24 hr capsule; Take 1 capsule by mouth every morning.  Dispense: 30 capsule; Refill: 0 - amphetamine-dextroamphetamine (ADDERALL XR) 25 MG 24 hr capsule; Take 1 capsule by mouth every morning.  Dispense: 30 capsule; Refill: 0 - amphetamine-dextroamphetamine (ADDERALL) 15 MG tablet; Take 1 tablet by mouth  daily as needed. At lunch-when working long hours  Dispense: 30 tablet; Refill: 0 - amphetamine-dextroamphetamine (ADDERALL) 15 MG tablet; Take 1 tablet by mouth daily.  Dispense: 30 tablet; Refill: 0 - amphetamine-dextroamphetamine (ADDERALL) 15 MG tablet; Take 1 tablet by mouth daily.  Dispense: 30 tablet; Refill: 0   3. Panic attack  He will call for refill of alprazolam   4. B12 deficiency  He will try otc supplementation   5. Anemia, unspecified type  Normal iron studies, we will replace B12 and recheck next visit  Check hemoccult cards times 3

## 2018-01-11 NOTE — Patient Instructions (Signed)
Preventive Care 18-39 Years, Male Preventive care refers to lifestyle choices and visits with your health care provider that can promote health and wellness. What does preventive care include?  A yearly physical exam. This is also called an annual well check.  Dental exams once or twice a year.  Routine eye exams. Ask your health care provider how often you should have your eyes checked.  Personal lifestyle choices, including: ? Daily care of your teeth and gums. ? Regular physical activity. ? Eating a healthy diet. ? Avoiding tobacco and drug use. ? Limiting alcohol use. ? Practicing safe sex. What happens during an annual well check? The services and screenings done by your health care provider during your annual well check will depend on your age, overall health, lifestyle risk factors, and family history of disease. Counseling Your health care provider may ask you questions about your:  Alcohol use.  Tobacco use.  Drug use.  Emotional well-being.  Home and relationship well-being.  Sexual activity.  Eating habits.  Work and work Statistician.  Screening You may have the following tests or measurements:  Height, weight, and BMI.  Blood pressure.  Lipid and cholesterol levels. These may be checked every 5 years starting at age 34.  Diabetes screening. This is done by checking your blood sugar (glucose) after you have not eaten for a while (fasting).  Skin check.  Hepatitis C blood test.  Hepatitis B blood test.  Sexually transmitted disease (STD) testing.  Discuss your test results, treatment options, and if necessary, the need for more tests with your health care provider. Vaccines Your health care provider may recommend certain vaccines, such as:  Influenza vaccine. This is recommended every year.  Tetanus, diphtheria, and acellular pertussis (Tdap, Td) vaccine. You may need a Td booster every 10 years.  Varicella vaccine. You may need this if you  have not been vaccinated.  HPV vaccine. If you are 23 or younger, you may need three doses over 6 months.  Measles, mumps, and rubella (MMR) vaccine. You may need at least one dose of MMR.You may also need a second dose.  Pneumococcal 13-valent conjugate (PCV13) vaccine. You may need this if you have certain conditions and have not been vaccinated.  Pneumococcal polysaccharide (PPSV23) vaccine. You may need one or two doses if you smoke cigarettes or if you have certain conditions.  Meningococcal vaccine. One dose is recommended if you are age 65-21 years and a first-year college student living in a residence hall, or if you have one of several medical conditions. You may also need additional booster doses.  Hepatitis A vaccine. You may need this if you have certain conditions or if you travel or work in places where you may be exposed to hepatitis A.  Hepatitis B vaccine. You may need this if you have certain conditions or if you travel or work in places where you may be exposed to hepatitis B.  Haemophilus influenzae type b (Hib) vaccine. You may need this if you have certain risk factors.  Talk to your health care provider about which screenings and vaccines you need and how often you need them. This information is not intended to replace advice given to you by your health care provider. Make sure you discuss any questions you have with your health care provider. Document Released: 01/02/2002 Document Revised: 07/26/2016 Document Reviewed: 09/07/2015 Elsevier Interactive Patient Education  [employer].

## 2018-02-11 ENCOUNTER — Ambulatory Visit: Payer: BLUE CROSS/BLUE SHIELD | Admitting: Family Medicine

## 2018-02-14 ENCOUNTER — Encounter: Payer: Self-pay | Admitting: Urology

## 2018-02-14 ENCOUNTER — Ambulatory Visit: Payer: BLUE CROSS/BLUE SHIELD | Admitting: Urology

## 2018-02-14 VITALS — BP 142/84 | HR 85 | Resp 16 | Ht 73.0 in | Wt 190.6 lb

## 2018-02-14 DIAGNOSIS — Z3009 Encounter for other general counseling and advice on contraception: Secondary | ICD-10-CM | POA: Diagnosis not present

## 2018-02-14 MED ORDER — DIAZEPAM 10 MG PO TABS: ORAL_TABLET | ORAL | 0 refills | Status: DC

## 2018-02-14 NOTE — Progress Notes (Signed)
02/14/2018 4:26 PM   Haynes HoehnAndrew Cleaver 03-20-1991 [id number]  Referring provider: Alba CorySowles, Krichna, MD [address] [place], [place] [address]  Chief complaint: Vasectomy  HPI: 27 year old male married with 3 children presents today with his wife for vasectomy consultation.  They have 3 children and desire vasectomy as a means of permanent sterilization.  He denies previous history of urologic problems and specifically denies chronic scrotal pain or history of epididymitis.  He has had no prior surgeries.   PMH: Past Medical History:  Diagnosis Date  . ADD (attention deficit disorder)   . Allergy   . Anxiety   . Back pain     Surgical History: Past Surgical History:  Procedure Laterality Date  . TONSILLECTOMY      Home Medications:  Allergies as of 02/14/2018   No Known Allergies     Medication List        Accurate as of 02/14/18  4:26 PM. Always use your most recent med list.          ALPRAZolam 0.5 MG tablet Commonly known as:  XANAX Take 1 tablet (0.5 mg total) by mouth as needed.   amphetamine-dextroamphetamine 25 MG 24 hr capsule Commonly known as:  ADDERALL XR Take 1 capsule by mouth every morning.   amphetamine-dextroamphetamine 25 MG 24 hr capsule Commonly known as:  ADDERALL XR Take 1 capsule by mouth every morning.   amphetamine-dextroamphetamine 25 MG 24 hr capsule Commonly known as:  ADDERALL XR Take 1 capsule by mouth every morning.   amphetamine-dextroamphetamine 15 MG tablet Commonly known as:  ADDERALL Take 1 tablet by mouth daily as needed. At lunch-when working long hours   amphetamine-dextroamphetamine 15 MG tablet Commonly known as:  ADDERALL Take 1 tablet by mouth daily.   amphetamine-dextroamphetamine 15 MG tablet Commonly known as:  ADDERALL Take 1 tablet by mouth daily.   cyclobenzaprine 10 MG tablet Commonly known as:  FLEXERIL Take 0.5-1 tablets (5-10 mg total) by mouth at bedtime.   fluticasone 50 MCG/ACT  nasal spray Commonly known as:  FLONASE Place 2 sprays into both nostrils daily.   levocetirizine 5 MG tablet Commonly known as:  XYZAL Take 1 tablet (5 mg total) by mouth every evening.       Allergies: No Known Allergies  Family History: Family History  Problem Relation Age of Onset  . Anxiety disorder Mother     Social History:  reports that he has never smoked. His smokeless tobacco use includes chew. He reports that he drinks about 10.8 oz of alcohol per week. He reports that he does not use drugs.  ROS: UROLOGY Frequent Urination?: No Hard to postpone urination?: No Burning/pain with urination?: No Get up at night to urinate?: No Leakage of urine?: No Urine stream starts and stops?: No Trouble starting stream?: No Do you have to strain to urinate?: No Blood in urine?: No Urinary tract infection?: No Sexually transmitted disease?: No Injury to kidneys or bladder?: No Painful intercourse?: No Weak stream?: No Erection problems?: No Penile pain?: No  Gastrointestinal Nausea?: No Vomiting?: No Indigestion/heartburn?: No Diarrhea?: No Constipation?: No  Constitutional Fever: No Night sweats?: No Weight loss?: No Fatigue?: No  Skin Skin rash/lesions?: No Itching?: No  Eyes Blurred vision?: No Double vision?: No  Ears/Nose/Throat Sore throat?: No Sinus problems?: No  Hematologic/Lymphatic Swollen glands?: No Easy bruising?: No  Cardiovascular Leg swelling?: No Chest pain?: No  Respiratory Cough?: No Shortness of breath?: No  Endocrine Excessive thirst?: No  Musculoskeletal Back  pain?: Yes Joint pain?: Yes  Neurological Headaches?: No Dizziness?: No  Psychologic Depression?: No Anxiety?: No  Physical Exam: BP (!) 142/84   Pulse 85   Resp 16   Ht 6\' 1"  (1.854 m)   Wt 190 lb 9.6 oz (86.5 kg)   SpO2 98%   BMI 25.15 kg/m   Constitutional:  Alert and oriented, No acute distress. HEENT: [place] AT, moist mucus membranes.   Trachea midline, no masses. Cardiovascular: No clubbing, cyanosis, or edema. Respiratory: Normal respiratory effort, no increased work of breathing. GI: Abdomen is soft, nontender, nondistended, no abdominal masses GU: No CVA tenderness.  Penis without lesions, testes descended bilaterally without masses or tenderness.  Vasa easily palpable bilaterally. Lymph: No cervical or inguinal lymphadenopathy. Skin: No rashes, bruises or suspicious lesions. Neurologic: Grossly intact, no focal deficits, moving all 4 extremities. Psychiatric: Normal mood and affect.  Laboratory Data: Lab Results  Component Value Date   WBC 8.0 11/09/2017   HGB 12.8 (L) 11/09/2017   HCT 37.5 (L) 11/09/2017   MCV 87.0 11/09/2017   PLT 258 11/09/2017    Lab Results  Component Value Date   CREATININE 1.19 11/09/2017     Lab Results  Component Value Date   HGBA1C 5.2 11/09/2017    Assessment & Plan:   27 year old male with undesired fertility who desires to schedule vasectomy.  We had a long discussion about vasectomy. We specifically discussed the procedure, recovery and the risks, benefits and alternatives of vasectomy. I explained that the procedure entails removal of a segment of each vas deferens, each of which conducts sperm, and that the purpose of this procedure is to cause sterility (inability to produce children or cause pregnancy). Vasectomy is intended to be permanent and irreversible form of contraception. Options for fertility after vasectomy include vasectomy reversal, or sperm retrieval with in vitro fertilization. These options are not always successful, and they may be expensive. We discussed reversible forms of birth control such as condoms, IUD or diaphragms, as well as the option of freezing sperm in a sperm bank prior to the vasectomy procedure. We discussed the importance of avoiding strenuous exercise for four days after vasectomy, and the importance of refraining from any form of  ejaculation for seven days after vasectomy. I explained that vasectomy does not produce immediate sterility so another form of contraceptive must be used until sterility is assured by having semen checked for sperm. Thus, a post vasectomy semen analysis is necessary to confirm sterility. Rarely, vasectomy must be repeated. We discussed the approximately 1 in 2,000 risk of pregnancy after vasectomy for men who have post-vasectomy semen analysis showing absent sperm or rare non-motile sperm. Typical side effects include a small amount of oozing blood, some discomfort and mild swelling in the area of incision.  Vasectomy does not affect sexual performance, function, please, sensation, interest, desire, satisfaction, penile erection, volume of semen or ejaculation. Other rare risks include allergy or adverse reaction to an anesthetic, testicular atrophy, hematoma, infection/abscess, prolonged tenderness of the vas deferens, pain, swelling, painful nodule or scar (called sperm granuloma) or epididymtis. We discussed chronic testicular pain syndrome. This has been reported to occur in as many as 1-2% of men and may be permanent. This can be treated with medication, small procedures or (rarely) surgery.  Premed Rx Valium was sent to his pharmacy and he was informed he would need a driver.    Riki Altes, MD  [facility] [address], [address] [place], [place]  [address] [phone]  

## 2018-02-21 ENCOUNTER — Ambulatory Visit: Payer: BLUE CROSS/BLUE SHIELD | Admitting: Urology

## 2018-02-21 ENCOUNTER — Encounter: Payer: Self-pay | Admitting: Urology

## 2018-02-21 VITALS — BP 128/80 | HR 81 | Resp 16 | Ht 71.0 in | Wt 184.0 lb

## 2018-02-21 DIAGNOSIS — Z302 Encounter for sterilization: Secondary | ICD-10-CM

## 2018-02-21 HISTORY — PX: VASECTOMY: SHX75

## 2018-02-21 MED ORDER — HYDROCODONE-ACETAMINOPHEN 5-325 MG PO TABS: 1.0000 | ORAL_TABLET | ORAL | 0 refills | Status: DC | PRN

## 2018-02-21 NOTE — Progress Notes (Signed)
Vasectomy Procedure Note  Indications: The patient is a 27 y.o. male who presents today for elective sterilization.  He has been consented for the procedure.  He is aware of the risks and benefits.  He had no additional questions.  He agrees to proceed.  He denies any other significant change since his last visit.  Pre-operative Diagnosis: Elective sterilization  Post-operative Diagnosis: Elective sterilization  Premedication: Valium 10 mg po  Surgeon: Lorin PicketScott C. Jordyne Poehlman, M.D  Description: The patient was prepped and draped in the standard fashion.  The right vas deferens was identified and brought superiorly to the anterior scrotal skin.  The skin and vas was then anesthetized utilizing 9 ml 1% lidocaine.  A small stab incision was made and spread with the vas dissector.  The vas was grasped utilizing the vas clamp and elevated out of the incision.  The vas was dissected free from surrounding tissue and vessels and an ~1 cm segment was excised.  The vas lumens were cauterized utilizing electrocautery.  The distal segment was buried in the surrounding sheath with a 3-0 chromic suture.  No significant bleeding was observed.  The vas ends were then dropped back into the hemiscrotum.  The skin was closed with hemostatic pressure.  An identical procedure was performed on the contralateral side.  Clean dry gauze was applied to the incision sites.  The patient tolerated the procedure well.  Complications: None  Recommendations: 1.  No lifting greater than 10 pounds or strenuousactivity for 1 week. 2.  Scrotal support for 1 week. 3.  Shower only for 1 week; may shower in the morning 4.  May resume intercourse in one week if no significant discomfort.  Continue alternate contraception for 12 weeks.  5.  Call for significant pain, swelling, redness, drainage or fever greater than 100.5. 6.  Rx hydrocodone/APAP 5/325 1-2 every 6 hours as needed for pain. 7.  Follow-up semen analyses 6 and 12  weeks.

## 2018-03-13 ENCOUNTER — Other Ambulatory Visit: Payer: Self-pay | Admitting: Family Medicine

## 2018-03-13 DIAGNOSIS — F410 Panic disorder [episodic paroxysmal anxiety] without agoraphobia: Secondary | ICD-10-CM

## 2018-03-13 NOTE — Telephone Encounter (Signed)
Copied from CRM [id number]. Topic: Quick Communication - See Telephone Encounter >> Mar 13, 2018  4:29 PM Trula SladeWalter, Linda F wrote: CRM for notification. See Telephone encounter for: 03/13/18. Patient would like a refill on his ALPRAZolam Prudy Feeler(XANAX) 0.5 MG tablet medication and [facility] [address]

## 2018-03-14 ENCOUNTER — Other Ambulatory Visit: Payer: Self-pay | Admitting: Family Medicine

## 2018-03-14 DIAGNOSIS — F410 Panic disorder [episodic paroxysmal anxiety] without agoraphobia: Secondary | ICD-10-CM

## 2018-03-14 MED ORDER — ALPRAZOLAM 0.5 MG PO TABS: 0.5000 mg | ORAL_TABLET | ORAL | 0 refills | Status: DC | PRN

## 2018-04-05 ENCOUNTER — Other Ambulatory Visit: Payer: BLUE CROSS/BLUE SHIELD

## 2018-04-05 ENCOUNTER — Other Ambulatory Visit: Payer: Self-pay

## 2018-04-05 DIAGNOSIS — Z9852 Vasectomy status: Secondary | ICD-10-CM | POA: Diagnosis not present

## 2018-04-08 LAB — POST-VAS SPERM EVALUATION,QUAL: Volume: 2 mL

## 2018-04-10 ENCOUNTER — Telehealth: Payer: Self-pay

## 2018-04-10 NOTE — Telephone Encounter (Signed)
Pt informed

## 2018-04-10 NOTE — Telephone Encounter (Signed)
-----   Message from Riki Altes, MD sent at 04/10/2018 11:16 AM EDT ----- 6-week post vas specimen does show sperm present.  Continue alternate contraception until a 12-week specimen is examined.

## 2018-05-01 ENCOUNTER — Ambulatory Visit: Payer: BLUE CROSS/BLUE SHIELD | Admitting: Family Medicine

## 2018-05-29 ENCOUNTER — Encounter: Payer: Self-pay | Admitting: Family Medicine

## 2018-05-29 ENCOUNTER — Ambulatory Visit (INDEPENDENT_AMBULATORY_CARE_PROVIDER_SITE_OTHER): Payer: BLUE CROSS/BLUE SHIELD | Admitting: Family Medicine

## 2018-05-29 DIAGNOSIS — E538 Deficiency of other specified B group vitamins: Secondary | ICD-10-CM | POA: Diagnosis not present

## 2018-05-29 DIAGNOSIS — M545 Low back pain, unspecified: Secondary | ICD-10-CM

## 2018-05-29 DIAGNOSIS — J302 Other seasonal allergic rhinitis: Secondary | ICD-10-CM

## 2018-05-29 DIAGNOSIS — J3089 Other allergic rhinitis: Secondary | ICD-10-CM

## 2018-05-29 DIAGNOSIS — F410 Panic disorder [episodic paroxysmal anxiety] without agoraphobia: Secondary | ICD-10-CM | POA: Diagnosis not present

## 2018-05-29 DIAGNOSIS — F901 Attention-deficit hyperactivity disorder, predominantly hyperactive type: Secondary | ICD-10-CM | POA: Diagnosis not present

## 2018-05-29 DIAGNOSIS — G8929 Other chronic pain: Secondary | ICD-10-CM

## 2018-05-29 MED ORDER — B-12 1000 MCG SL SUBL: 1.0000 | SUBLINGUAL_TABLET | Freq: Every day | SUBLINGUAL | 5 refills | Status: DC

## 2018-05-29 MED ORDER — AMPHETAMINE-DEXTROAMPHET ER 25 MG PO CP24: 25.0000 mg | ORAL_CAPSULE | ORAL | 0 refills | Status: DC

## 2018-05-29 MED ORDER — CYCLOBENZAPRINE HCL 10 MG PO TABS: 5.0000 mg | ORAL_TABLET | Freq: Every day | ORAL | 0 refills | Status: DC

## 2018-05-29 MED ORDER — AMPHETAMINE-DEXTROAMPHETAMINE 15 MG PO TABS: 15.0000 mg | ORAL_TABLET | Freq: Every day | ORAL | 0 refills | Status: DC

## 2018-05-29 MED ORDER — AMPHETAMINE-DEXTROAMPHETAMINE 15 MG PO TABS: 1.0000 | ORAL_TABLET | Freq: Every day | ORAL | 0 refills | Status: DC

## 2018-05-29 NOTE — Progress Notes (Signed)
Name: William Rivers   MRN: [id number]    DOB: 1991-09-27   Date:05/29/2018       Progress Note  Subjective  Chief Complaint  Chief Complaint  Patient presents with  . Follow-up  . Medication Refill    HPI  ADHD: Avonte works long hours, aboutup to 12  hours per day - because of his side jobs , takes Adderal XR in am and a Adderal 15mg  around 2pm, it helps him stay focused and also helps him with anxiety, stops his mind from worrying His weight is stable. 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  Anemia/B12 deficiency:. Denies pica, he states fatigue is not as intense . He has noticed tingling on both hands. 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 He has not been taking supplements, explained the importance of taking otc sublingual or getting B12 injections   Chronic low back pain: he wakes up feeling very stiff in am's, he is takes Flexeril a few times a week before bed time and helps him sleep better, and wakes up not feeling sitff. Works as an Personnel officer.  He states usually aching on lower back, but sometimes shoots down right leg, episodes of radiculitis are down to a few times a week now,Aggravated by sitting for a prolonged period of time. Pain right now is 2/10.   Anxiety: taking Alprazolam prn, 30pills for 90 days, he only takes it prn for panic attacks, he denies side effects. He is aware of risk of taking it and driving. Panic attacks have been stable, it happens in spurts, episodes of panic attacks about twice a week. We will try to wean him off slowly to a max of 30 pills for 90 days. He states harder during the summer because of allergies and at times causes him difficulty breathing   AR: he is stable, he is not using nasal spray, he is still taking xyzal it helps with nasal congestion and rhinorrhea.    Patient Active Problem List   Diagnosis Date Noted  . ADD (attention deficit disorder) 05/29/2015  .  Chronic LBP 05/29/2015  . Lactose intolerance 05/29/2015  . Panic attack 05/29/2015  . Perennial allergic rhinitis with seasonal variation 05/29/2015  . Lumbosacral radiculitis 05/29/2015    Past Surgical History:  Procedure Laterality Date  . TONSILLECTOMY      Family History  Problem Relation Age of Onset  . Anxiety disorder Mother     Social History   Socioeconomic History  . Marital status: Significant Other    Spouse name: Ave Filter   . Number of children: 3  . Years of education: Not on file  . Highest education level: 12th grade  Occupational History  . Occupation: Personnel officer   Social Needs  . Financial resource strain: Somewhat hard  . Food insecurity:    Worry: Never true    Inability: Never true  . Transportation needs:    Medical: No    Non-medical: No  Tobacco Use  . Smoking status: Never Smoker  . Smokeless tobacco: Current User    Types: Chew  Substance and Sexual Activity  . Alcohol use: Yes    Alcohol/week: 10.8 oz    Types: 6 Cans of beer, 12 Standard drinks or equivalent per week  . Drug use: No  . Sexual activity: Yes    Partners: Female    Birth control/protection: IUD  Lifestyle  . Physical activity:    Days per week: 0  days    Minutes per session: 0 min  . Stress: Not at all  Relationships  . Social connections:    Talks on phone: More than three times a week    Gets together: More than three times a week    Attends religious service: Never    Active member of club or organization: No    Attends meetings of clubs or organizations: Never    Relationship status: Living with partner  . Intimate partner violence:    Fear of current or ex partner: No    Emotionally abused: No    Physically abused: No    Forced sexual activity: No  Other Topics Concern  . Not on file  Social History Narrative   Living with girlfriend - Ave FilterChandler she is mother of one of his kids ( son)    He has two older children from previous relationship.       Current Outpatient Medications:  .  ALPRAZolam (XANAX) 0.5 MG tablet, Take 1 tablet (0.5 mg total) by mouth as needed., Disp: 30 tablet, Rfl: 0 .  amphetamine-dextroamphetamine (ADDERALL XR) 25 MG 24 hr capsule, Take 1 capsule by mouth every morning., Disp: 30 capsule, Rfl: 0 .  amphetamine-dextroamphetamine (ADDERALL XR) 25 MG 24 hr capsule, Take 1 capsule by mouth every morning., Disp: 30 capsule, Rfl: 0 .  amphetamine-dextroamphetamine (ADDERALL XR) 25 MG 24 hr capsule, Take 1 capsule by mouth every morning., Disp: 30 capsule, Rfl: 0 .  amphetamine-dextroamphetamine (ADDERALL) 15 MG tablet, Take 1 tablet by mouth daily. At lunch-when working long hours, Disp: 30 tablet, Rfl: 0 .  cyclobenzaprine (FLEXERIL) 10 MG tablet, Take 0.5-1 tablets (5-10 mg total) by mouth at bedtime., Disp: 90 tablet, Rfl: 0 .  amphetamine-dextroamphetamine (ADDERALL) 15 MG tablet, Take 1 tablet by mouth daily., Disp: 30 tablet, Rfl: 0 .  amphetamine-dextroamphetamine (ADDERALL) 15 MG tablet, Take 1 tablet by mouth daily., Disp: 30 tablet, Rfl: 0 .  fluticasone (FLONASE) 50 MCG/ACT nasal spray, Place 2 sprays into both nostrils daily. (Patient not taking: Reported on 05/29/2018), Disp: 48 g, Rfl: 1 .  levocetirizine (XYZAL) 5 MG tablet, Take 1 tablet (5 mg total) by mouth every evening. (Patient not taking: Reported on 05/29/2018), Disp: 90 tablet, Rfl: 1  No Known Allergies   ROS  Constitutional: Negative for fever or weight change.  Respiratory: Negative for cough and shortness of breath.   Cardiovascular: Negative for chest pain or palpitations.  Gastrointestinal: Negative for abdominal pain, no bowel changes.  Musculoskeletal: Negative for gait problem or joint swelling.  Skin: Negative for rash.  Neurological: Negative for dizziness or headache.  No other specific complaints in a complete review of systems (except as listed in HPI above).   Objective  Vitals:   05/29/18 0757  BP: 120/80  Pulse: 80   Resp: 18  Temp: 98.4 F (36.9 C)  TempSrc: Oral  SpO2: 95%  Weight: 196 lb 6.4 oz (89.1 kg)  Height: 6' (1.829 m)    Body mass index is 26.64 kg/m.  Physical Exam  Constitutional: Patient appears well-developed and well-nourished. No distress.  HEENT: head atraumatic, normocephalic, pupils equal and reactive to light, neck supple, throat within normal limits Cardiovascular: Normal rate, regular rhythm and normal heart sounds.  No murmur heard. No BLE edema. Pulmonary/Chest: Effort normal and breath sounds normal. No respiratory distress. Abdominal: Soft.  There is no tenderness. Psychiatric: Patient has a normal mood and affect. behavior is normal. Judgment and thought content normal.  Recent  Results (from the past 2160 hour(s))  Post-Vas Sperm Evaluation,Qual     Status: Abnormal   Collection Time: 04/05/18  4:19 PM  Result Value Ref Range   Volume 2.0 Not Estab. mL   Post-Vas Sperm, Qual Present (A) Absent      PHQ2/9: Depression screen [facility] 2/9 05/29/2018 01/11/2018 11/09/2017 05/10/2017 02/07/2017  Decreased Interest 0 0 0 0 0  Down, Depressed, Hopeless 0 0 0 0 0  PHQ - 2 Score 0 0 0 0 0  Altered sleeping 0 - - - -  Tired, decreased energy 0 - - - -  Change in appetite 0 - - - -  Feeling bad or failure about yourself  0 - - - -  Trouble concentrating 0 - - - -  Moving slowly or fidgety/restless 0 - - - -  Suicidal thoughts 0 - - - -  PHQ-9 Score 0 - - - -  Difficult doing work/chores Not difficult at all - - - -     Fall Risk: Fall Risk  05/29/2018 01/11/2018 11/09/2017 05/10/2017 02/07/2017  Falls in the past year? No No No No No      Assessment & Plan  1. Attention deficit hyperactivity disorder (ADHD), predominantly hyperactive type  - amphetamine-dextroamphetamine (ADDERALL XR) 25 MG 24 hr capsule; Take 1 capsule by mouth every morning.  Dispense: 30 capsule; Refill: 0 - amphetamine-dextroamphetamine (ADDERALL XR) 25 MG 24 hr capsule; Take 1 capsule by  mouth every morning.  Dispense: 30 capsule; Refill: 0 - amphetamine-dextroamphetamine (ADDERALL XR) 25 MG 24 hr capsule; Take 1 capsule by mouth every morning.  Dispense: 30 capsule; Refill: 0 - amphetamine-dextroamphetamine (ADDERALL) 15 MG tablet; Take 1 tablet by mouth daily. At lunch-when working long hours  Dispense: 30 tablet; Refill: 0 - amphetamine-dextroamphetamine (ADDERALL) 15 MG tablet; Take 1 tablet by mouth daily.  Dispense: 30 tablet; Refill: 0 - amphetamine-dextroamphetamine (ADDERALL) 15 MG tablet; Take 1 tablet by mouth daily.  Dispense: 30 tablet; Refill: 0  2. Panic attack  He will contact me when only has 2 pills left   3. Chronic bilateral low back pain without sciatica  stable  4. B12 deficiency  Resume B12 supplementation   5. Perennial allergic rhinitis with seasonal variation  stable

## 2018-07-05 ENCOUNTER — Other Ambulatory Visit: Payer: Self-pay | Admitting: Family Medicine

## 2018-07-05 DIAGNOSIS — F410 Panic disorder [episodic paroxysmal anxiety] without agoraphobia: Secondary | ICD-10-CM

## 2018-07-05 MED ORDER — ALPRAZOLAM 0.5 MG PO TABS: 0.5000 mg | ORAL_TABLET | ORAL | 0 refills | Status: DC | PRN

## 2018-07-05 NOTE — Telephone Encounter (Signed)
Copied from CRM [id number]. Topic: Quick Communication - Rx Refill/Question >> Jul 05, 2018  3:52 PM Mickel BaasMcGee, Elber Galyean B, [place] wrote: Medication: ALPRAZolam Prudy Feeler(XANAX) 0.5 MG tablet  Has the patient contacted their pharmacy? Yes.   (Agent: If no, request that the patient contact the pharmacy for the refill.) (Agent: If yes, when and what did the pharmacy advise?)  Preferred Pharmacy (with phone number or street name): [facility] - [place], [place] - [address]  Agent: Please be advised that RX refills may take up to 3 business days. We ask that you follow-up with your pharmacy.

## 2018-08-26 ENCOUNTER — Other Ambulatory Visit: Payer: Self-pay | Admitting: Family Medicine

## 2018-08-26 DIAGNOSIS — F901 Attention-deficit hyperactivity disorder, predominantly hyperactive type: Secondary | ICD-10-CM

## 2018-08-26 NOTE — Telephone Encounter (Signed)
Refill request was sent to Dr. Krichna Sowles for approval and submission.  

## 2018-08-26 NOTE — Telephone Encounter (Signed)
Copied from CRM [id number]. Topic: Quick Communication - Rx Refill/Question >> Aug 26, 2018 11:59 AM Luanna Cole wrote: Medication:amphetamine-dextroamphetamine (ADDERALL) 15 MG tablet [[id number]] pt has appointment 09/02/18  Has the patient contacted their pharmacy? no Preferred Pharmacy (with phone number or street name): [facility] [address] - [place], [place] - [address] [phone] (Phone) [phone] (Fax)    Agent: Please be advised that RX refills may take up to 3 business days. We ask that you follow-up with your pharmacy.

## 2018-08-27 NOTE — Telephone Encounter (Signed)
Spoke with pt and he is coming in tomorrow

## 2018-08-27 NOTE — Telephone Encounter (Signed)
Can he come this week instead? I would prefer not sending controlled medication without seeing her

## 2018-08-28 ENCOUNTER — Encounter: Payer: Self-pay | Admitting: Family Medicine

## 2018-08-28 ENCOUNTER — Ambulatory Visit: Payer: BLUE CROSS/BLUE SHIELD | Admitting: Family Medicine

## 2018-08-28 VITALS — BP 118/66 | HR 73 | Temp 98.1°F | Resp 16 | Ht 72.0 in | Wt 194.2 lb

## 2018-08-28 DIAGNOSIS — Z23 Encounter for immunization: Secondary | ICD-10-CM | POA: Diagnosis not present

## 2018-08-28 DIAGNOSIS — E538 Deficiency of other specified B group vitamins: Secondary | ICD-10-CM

## 2018-08-28 DIAGNOSIS — M545 Low back pain: Secondary | ICD-10-CM | POA: Diagnosis not present

## 2018-08-28 DIAGNOSIS — J3089 Other allergic rhinitis: Secondary | ICD-10-CM

## 2018-08-28 DIAGNOSIS — F901 Attention-deficit hyperactivity disorder, predominantly hyperactive type: Secondary | ICD-10-CM | POA: Diagnosis not present

## 2018-08-28 DIAGNOSIS — J302 Other seasonal allergic rhinitis: Secondary | ICD-10-CM

## 2018-08-28 DIAGNOSIS — G8929 Other chronic pain: Secondary | ICD-10-CM

## 2018-08-28 DIAGNOSIS — F410 Panic disorder [episodic paroxysmal anxiety] without agoraphobia: Secondary | ICD-10-CM | POA: Diagnosis not present

## 2018-08-28 MED ORDER — AMPHETAMINE-DEXTROAMPHET ER 25 MG PO CP24: 25.0000 mg | ORAL_CAPSULE | ORAL | 0 refills | Status: DC

## 2018-08-28 MED ORDER — CYCLOBENZAPRINE HCL 10 MG PO TABS: 5.0000 mg | ORAL_TABLET | Freq: Every day | ORAL | 0 refills | Status: DC

## 2018-08-28 MED ORDER — AMPHETAMINE-DEXTROAMPHETAMINE 15 MG PO TABS: 1.0000 | ORAL_TABLET | Freq: Every day | ORAL | 0 refills | Status: DC

## 2018-08-28 MED ORDER — AMPHETAMINE-DEXTROAMPHETAMINE 15 MG PO TABS: 15.0000 mg | ORAL_TABLET | Freq: Every day | ORAL | 0 refills | Status: DC

## 2018-08-28 MED ORDER — ALPRAZOLAM 0.5 MG PO TABS: 0.5000 mg | ORAL_TABLET | ORAL | 0 refills | Status: DC | PRN

## 2018-08-28 NOTE — Progress Notes (Signed)
Name: William Rivers   MRN: [id number]    DOB: 23-Feb-1991   Date:08/28/2018       Progress Note  Subjective  Chief Complaint  Chief Complaint  Patient presents with  . ADHD  . Anemia  . Anxiety  . Allergic Rhinitis   . Back Pain  . Medication Refill    HPI  ADHD: Franklin works long hours, aboutup to 12  hours per day - because of his side jobs , takes Adderal XR in am and a Adderal 15mg  around 2pm, it helps him stay focused and also helps him with anxiety, stops his mind from worrying His weight is stable. 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 Unchanged, states medication continues to work well for him.   Anemia/B12 deficiency:. Denies pica, he states fatigue is not as intense . He has noticed tingling on both hands. 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 He states he has been taking B12 SL about twice a week, he states tingling and numbness on hands, discussed possible carpal tunnel but he does not want to see Ortho at this time  Chronic low back pain: he wakes up feeling very stiff in am's, he is takes Flexeril a few times a week before bed time and helps him sleep better, and wakes up not feeling sitff. Works as an Personnel officer.He states usually aching on lower back, but sometimes shoots down right leg, episodes of radiculitis is sporadic. Aggravated by sitting for a prolonged period of time. Pain right now is 3/10  Anxiety: taking Alprazolam prn, 30pills for 90 days, he only takes it prn for panic attacks, he denies side effects. He is aware of risk of taking it and driving. Panic attacks have been stable, it happens in spurts, episodes of panic attacks about twice a week. He still have some left at home, but we will refill it today to be at the same time as adderal.   AR: heis stable, he is not using nasal spray, he is still taking xyzal helps but does like nasal steroid.   Patient Active Problem List    Diagnosis Date Noted  . ADD (attention deficit disorder) 05/29/2015  . Chronic LBP 05/29/2015  . Lactose intolerance 05/29/2015  . Panic attack 05/29/2015  . Perennial allergic rhinitis with seasonal variation 05/29/2015  . Lumbosacral radiculitis 05/29/2015    Past Surgical History:  Procedure Laterality Date  . TONSILLECTOMY    . VASECTOMY Bilateral 02/21/2018    Family History  Problem Relation Age of Onset  . Anxiety disorder Mother     Social History   Socioeconomic History  . Marital status: Significant Other    Spouse name: Ave Filter   . Number of children: 3  . Years of education: Not on file  . Highest education level: 12th grade  Occupational History  . Occupation: Personnel officer   Social Needs  . Financial resource strain: Somewhat hard  . Food insecurity:    Worry: Never true    Inability: Never true  . Transportation needs:    Medical: No    Non-medical: No  Tobacco Use  . Smoking status: Never Smoker  . Smokeless tobacco: Current User    Types: Chew  Substance and Sexual Activity  . Alcohol use: Yes    Alcohol/week: 18.0 standard drinks    Types: 6 Cans of beer, 12 Standard drinks or equivalent per week  . Drug use: No  . Sexual activity: Yes  Partners: Female    Birth control/protection: IUD  Lifestyle  . Physical activity:    Days per week: 0 days    Minutes per session: 0 min  . Stress: Not at all  Relationships  . Social connections:    Talks on phone: More than three times a week    Gets together: More than three times a week    Attends religious service: Never    Active member of club or organization: No    Attends meetings of clubs or organizations: Never    Relationship status: Living with partner  . Intimate partner violence:    Fear of current or ex partner: No    Emotionally abused: No    Physically abused: No    Forced sexual activity: No  Other Topics Concern  . Not on file  Social History Narrative   Living with  girlfriend - Ave Filter she is mother of one of his kids ( son)    He has two older children from previous relationship.      Current Outpatient Medications:  .  amphetamine-dextroamphetamine (ADDERALL XR) 25 MG 24 hr capsule, Take 1 capsule by mouth every morning., Disp: 30 capsule, Rfl: 0 .  amphetamine-dextroamphetamine (ADDERALL XR) 25 MG 24 hr capsule, Take 1 capsule by mouth every morning., Disp: 30 capsule, Rfl: 0 .  amphetamine-dextroamphetamine (ADDERALL XR) 25 MG 24 hr capsule, Take 1 capsule by mouth every morning., Disp: 30 capsule, Rfl: 0 .  amphetamine-dextroamphetamine (ADDERALL) 15 MG tablet, Take 1 tablet by mouth daily. At lunch-when working long hours, Disp: 30 tablet, Rfl: 0 .  amphetamine-dextroamphetamine (ADDERALL) 15 MG tablet, Take 1 tablet by mouth daily., Disp: 30 tablet, Rfl: 0 .  amphetamine-dextroamphetamine (ADDERALL) 15 MG tablet, Take 1 tablet by mouth daily., Disp: 30 tablet, Rfl: 0 .  Cyanocobalamin (B-12) 1000 MCG SUBL, Place 1 tablet under the tongue daily., Disp: 30 each, Rfl: 5 .  cyclobenzaprine (FLEXERIL) 10 MG tablet, Take 0.5-1 tablets (5-10 mg total) by mouth at bedtime., Disp: 90 tablet, Rfl: 0 .  ALPRAZolam (XANAX) 0.5 MG tablet, Take 1 tablet (0.5 mg total) by mouth as needed., Disp: 30 tablet, Rfl: 0 .  levocetirizine (XYZAL) 5 MG tablet, Take 1 tablet (5 mg total) by mouth every evening. (Patient not taking: Reported on 05/29/2018), Disp: 90 tablet, Rfl: 1  No Known Allergies  I personally reviewed active problem list, medication list, allergies, family history, social history with the patient/caregiver today.   ROS  Constitutional: Negative for fever or weight change.  Respiratory: Negative for cough and shortness of breath.   Cardiovascular: Negative for chest pain or palpitations.  Gastrointestinal: Negative for abdominal pain, no bowel changes.  Musculoskeletal: Negative for gait problem or joint swelling.  Skin: Negative for rash.   Neurological: Negative for dizziness or headache.  No other specific complaints in a complete review of systems (except as listed in HPI above).   Objective  Vitals:   08/28/18 0923  BP: 118/66  Pulse: 73  Resp: 16  Temp: 98.1 F (36.7 C)  TempSrc: Oral  SpO2: 99%  Weight: 194 lb 3.2 oz (88.1 kg)  Height: 6' (1.829 m)    Body mass index is 26.34 kg/m.  Physical Exam  Constitutional: Patient appears well-developed and well-nourished. Overweight.  No distress.  HEENT: head atraumatic, normocephalic, pupils equal and reactive to light,  neck supple, throat within normal limits Cardiovascular: Normal rate, regular rhythm and normal heart sounds.  No murmur heard. No BLE edema.  Pulmonary/Chest: Effort normal and breath sounds normal. No respiratory distress. Abdominal: Soft.  There is no tenderness. Psychiatric: Patient has a normal mood and affect. behavior is normal. Judgment and thought content normal. Muscular Skeletal: no pain during palpation of lumbar spine   PHQ2/9: Depression screen [facility] 2/9 08/28/2018 05/29/2018 01/11/2018 11/09/2017 05/10/2017  Decreased Interest 0 0 0 0 0  Down, Depressed, Hopeless 0 0 0 0 0  PHQ - 2 Score 0 0 0 0 0  Altered sleeping - 0 - - -  Tired, decreased energy - 0 - - -  Change in appetite - 0 - - -  Feeling bad or failure about yourself  - 0 - - -  Trouble concentrating - 0 - - -  Moving slowly or fidgety/restless - 0 - - -  Suicidal thoughts - 0 - - -  PHQ-9 Score - 0 - - -  Difficult doing work/chores - Not difficult at all - - -     Fall Risk: Fall Risk  08/28/2018 05/29/2018 01/11/2018 11/09/2017 05/10/2017  Falls in the past year? No No No No No     Functional Status Survey: Is the patient deaf or have difficulty hearing?: No Does the patient have difficulty seeing, even when wearing glasses/contacts?: No Does the patient have difficulty concentrating, remembering, or making decisions?: No Does the patient have difficulty  walking or climbing stairs?: No Does the patient have difficulty dressing or bathing?: No Does the patient have difficulty doing errands alone such as visiting a doctor's office or shopping?: No    Assessment & Plan  1. Attention deficit hyperactivity disorder (ADHD), predominantly hyperactive type  - amphetamine-dextroamphetamine (ADDERALL XR) 25 MG 24 hr capsule; Take 1 capsule by mouth every morning.  Dispense: 30 capsule; Refill: 0 - amphetamine-dextroamphetamine (ADDERALL XR) 25 MG 24 hr capsule; Take 1 capsule by mouth every morning.  Dispense: 30 capsule; Refill: 0 - amphetamine-dextroamphetamine (ADDERALL XR) 25 MG 24 hr capsule; Take 1 capsule by mouth every morning.  Dispense: 30 capsule; Refill: 0 - amphetamine-dextroamphetamine (ADDERALL) 15 MG tablet; Take 1 tablet by mouth daily. At lunch-when working long hours  Dispense: 30 tablet; Refill: 0 - amphetamine-dextroamphetamine (ADDERALL) 15 MG tablet; Take 1 tablet by mouth daily.  Dispense: 30 tablet; Refill: 0 - amphetamine-dextroamphetamine (ADDERALL) 15 MG tablet; Take 1 tablet by mouth daily.  Dispense: 30 tablet; Refill: 0  2. Need for immunization against influenza  Refused today   3. Panic attack  - ALPRAZolam (XANAX) 0.5 MG tablet; Take 1 tablet (0.5 mg total) by mouth as needed.  Dispense: 30 tablet; Refill: 0  4. Chronic bilateral low back pain without sciatica  - cyclobenzaprine (FLEXERIL) 10 MG tablet; Take 0.5-1 tablets (5-10 mg total) by mouth at bedtime.  Dispense: 90 tablet; Refill: 0  5. B12 deficiency   6. Perennial allergic rhinitis with seasonal variation

## 2018-09-02 ENCOUNTER — Ambulatory Visit: Payer: BLUE CROSS/BLUE SHIELD | Admitting: Family Medicine

## 2018-10-23 ENCOUNTER — Ambulatory Visit: Payer: BLUE CROSS/BLUE SHIELD | Admitting: Family Medicine

## 2018-11-25 ENCOUNTER — Other Ambulatory Visit: Payer: Self-pay | Admitting: Family Medicine

## 2018-11-25 ENCOUNTER — Telehealth: Payer: Self-pay | Admitting: Family Medicine

## 2018-11-25 DIAGNOSIS — F901 Attention-deficit hyperactivity disorder, predominantly hyperactive type: Secondary | ICD-10-CM

## 2018-11-25 MED ORDER — AMPHETAMINE-DEXTROAMPHETAMINE 15 MG PO TABS: 1.0000 | ORAL_TABLET | Freq: Every day | ORAL | 0 refills | Status: DC

## 2018-11-25 MED ORDER — AMPHETAMINE-DEXTROAMPHET ER 25 MG PO CP24: 25.0000 mg | ORAL_CAPSULE | ORAL | 0 refills | Status: DC

## 2018-11-25 NOTE — Telephone Encounter (Signed)
Pt stopped by due to being out of medication. He is on the schedule to be seen 1.15.2020 but needed to be seen sooner. Dr Carlynn Purl is completely booked therefore she sent him in a one month supply. Pt is to keep the 1.15.2020 appt

## 2018-12-04 ENCOUNTER — Ambulatory Visit (INDEPENDENT_AMBULATORY_CARE_PROVIDER_SITE_OTHER): Payer: BLUE CROSS/BLUE SHIELD | Admitting: Family Medicine

## 2018-12-04 ENCOUNTER — Encounter: Payer: Self-pay | Admitting: Family Medicine

## 2018-12-04 VITALS — BP 110/60 | HR 82 | Temp 97.8°F | Resp 16 | Ht 72.0 in | Wt 199.9 lb

## 2018-12-04 DIAGNOSIS — M545 Low back pain, unspecified: Secondary | ICD-10-CM

## 2018-12-04 DIAGNOSIS — Z23 Encounter for immunization: Secondary | ICD-10-CM | POA: Diagnosis not present

## 2018-12-04 DIAGNOSIS — F410 Panic disorder [episodic paroxysmal anxiety] without agoraphobia: Secondary | ICD-10-CM

## 2018-12-04 DIAGNOSIS — G8929 Other chronic pain: Secondary | ICD-10-CM

## 2018-12-04 DIAGNOSIS — F901 Attention-deficit hyperactivity disorder, predominantly hyperactive type: Secondary | ICD-10-CM | POA: Diagnosis not present

## 2018-12-04 MED ORDER — AMPHETAMINE-DEXTROAMPHETAMINE 15 MG PO TABS: 15.0000 mg | ORAL_TABLET | Freq: Every day | ORAL | 0 refills | Status: DC

## 2018-12-04 MED ORDER — AMPHETAMINE-DEXTROAMPHETAMINE 15 MG PO TABS: 1.0000 | ORAL_TABLET | Freq: Every day | ORAL | 0 refills | Status: DC

## 2018-12-04 MED ORDER — AMPHETAMINE-DEXTROAMPHET ER 25 MG PO CP24: 25.0000 mg | ORAL_CAPSULE | ORAL | 0 refills | Status: DC

## 2018-12-04 MED ORDER — ALPRAZOLAM 0.5 MG PO TABS: 0.5000 mg | ORAL_TABLET | ORAL | 0 refills | Status: DC | PRN

## 2018-12-04 MED ORDER — CYCLOBENZAPRINE HCL 10 MG PO TABS: 5.0000 mg | ORAL_TABLET | Freq: Every day | ORAL | 0 refills | Status: DC

## 2018-12-04 NOTE — Progress Notes (Signed)
Name: William Rivers   MRN: [id number]    DOB: 1991-04-06   Date:12/04/2018       Progress Note  Subjective  Chief Complaint  Chief Complaint  Patient presents with  . ADD  . Panic Attack    HPI   ADHD: William Rivers works long hours, aboutup to12hours per day - because of his side jobs, takes Adderal XR in am and a Adderal 15mg  around 2pm, it helps him stay focused and also helps him with anxiety, stops his mind from worrying His weight has gone up 4 lbs since last visit, he states he does not want to go over 200 lbs and will try to eat better.  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 He had to come last week for a refill since appointment was made after time to fill medications.   Anemia/B12 deficiency:. Denies pica, he states fatigue is not as intense. He has noticed tingling on both hands. 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 He states he has been taking B12 SL about twice a week, he states tingling and numbness on hands. Unchanged, wants to hold off on seeing Ortho   Chronic low back pain: he wakes up feeling very stiff in am's, he is takes Flexeril a few times a week before bed time and helps him sleep better, and wakes up not feeling as sitff. Works as an Personnel officerelectrician.He states usually aching on lower back, but sometimes shoots down right leg, episodes of radiculitis is sporadic. Aggravated by sitting for a prolonged period of time. No pain at this time  Anxiety: taking Alprazolam prn,30pills for 90 days, he only takes it prn for panic attacks, he denies side effects. He is aware of risk of taking it and driving. Panic attacks have been stable, it happens in spurts, episodes of panic attacks about twice a week. He states last episode was at work, he was working outside with three layers of clothe and had to go to a tight spot and felt claustrophobic.   AR: heis stable,he is not using nasal spray, he is still  takingxyzal , he gets it otc   Patient Active Problem List   Diagnosis Date Noted  . ADD (attention deficit disorder) 05/29/2015  . Chronic LBP 05/29/2015  . Lactose intolerance 05/29/2015  . Panic attack 05/29/2015  . Perennial allergic rhinitis with seasonal variation 05/29/2015  . Lumbosacral radiculitis 05/29/2015    Past Surgical History:  Procedure Laterality Date  . TONSILLECTOMY    . VASECTOMY Bilateral 02/21/2018    Family History  Problem Relation Age of Onset  . Anxiety disorder Mother     Social History   Socioeconomic History  . Marital status: Significant Other    Spouse name: Ave FilterChandler   . Number of children: 3  . Years of education: Not on file  . Highest education level: 12th grade  Occupational History  . Occupation: Personnel officerelectrician   Social Needs  . Financial resource strain: Somewhat hard  . Food insecurity:    Worry: Never true    Inability: Never true  . Transportation needs:    Medical: No    Non-medical: No  Tobacco Use  . Smoking status: Never Smoker  . Smokeless tobacco: Current User    Types: Chew  Substance and Sexual Activity  . Alcohol use: Yes    Alcohol/week: 18.0 standard drinks    Types: 6 Cans of beer, 12 Standard drinks or equivalent per week  .  Drug use: No  . Sexual activity: Yes    Partners: Female    Birth control/protection: I.U.D.  Lifestyle  . Physical activity:    Days per week: 0 days    Minutes per session: 0 min  . Stress: Not at all  Relationships  . Social connections:    Talks on phone: More than three times a week    Gets together: More than three times a week    Attends religious service: Never    Active member of club or organization: No    Attends meetings of clubs or organizations: Never    Relationship status: Living with partner  . Intimate partner violence:    Fear of current or ex partner: No    Emotionally abused: No    Physically abused: No    Forced sexual activity: No  Other Topics  Concern  . Not on file  Social History Narrative   Living with girlfriend - Ave FilterChandler she is mother of one of his kids ( son)    He has two older children from previous relationship.      Current Outpatient Medications:  .  ALPRAZolam (XANAX) 0.5 MG tablet, Take 1 tablet (0.5 mg total) by mouth as needed., Disp: 30 tablet, Rfl: 0 .  amphetamine-dextroamphetamine (ADDERALL XR) 25 MG 24 hr capsule, Take 1 capsule by mouth every morning., Disp: 30 capsule, Rfl: 0 .  amphetamine-dextroamphetamine (ADDERALL XR) 25 MG 24 hr capsule, Take 1 capsule by mouth every morning., Disp: 30 capsule, Rfl: 0 .  amphetamine-dextroamphetamine (ADDERALL XR) 25 MG 24 hr capsule, Take 1 capsule by mouth every morning., Disp: 30 capsule, Rfl: 0 .  amphetamine-dextroamphetamine (ADDERALL) 15 MG tablet, Take 1 tablet by mouth daily., Disp: 30 tablet, Rfl: 0 .  amphetamine-dextroamphetamine (ADDERALL) 15 MG tablet, Take 1 tablet by mouth daily., Disp: 30 tablet, Rfl: 0 .  amphetamine-dextroamphetamine (ADDERALL) 15 MG tablet, Take 1 tablet by mouth daily. At lunch-when working long hours, Disp: 30 tablet, Rfl: 0 .  Cyanocobalamin (B-12) 1000 MCG SUBL, Place 1 tablet under the tongue daily., Disp: 30 each, Rfl: 5 .  cyclobenzaprine (FLEXERIL) 10 MG tablet, Take 0.5-1 tablets (5-10 mg total) by mouth at bedtime., Disp: 90 tablet, Rfl: 0 .  levocetirizine (XYZAL) 5 MG tablet, Take 1 tablet (5 mg total) by mouth every evening., Disp: 90 tablet, Rfl: 1  No Known Allergies  I personally reviewed active problem list, medication list, allergies, family history, social history, health maintenance with the patient/caregiver today.   ROS  Constitutional: Negative for fever , positive for  weight change.  Respiratory: Negative for cough and shortness of breath.   Cardiovascular: Negative for chest pain or palpitations.  Gastrointestinal: Negative for abdominal pain, no bowel changes.  Musculoskeletal: Negative for gait  problem or joint swelling.  Skin: Negative for rash.  Neurological: Negative for dizziness or headache.  No other specific complaints in a complete review of systems (except as listed in HPI above).  Objective  Vitals:   12/04/18 0747  BP: 110/60  Pulse: 82  Resp: 16  Temp: 97.8 F (36.6 C)  TempSrc: Oral  SpO2: 98%  Weight: 199 lb 14.4 oz (90.7 kg)  Height: 6' (1.829 m)    Body mass index is 27.11 kg/m.  Physical Exam  Constitutional: Patient appears well-developed and well-nourished. Overweight. No distress.  HEENT: head atraumatic, normocephalic, pupils equal and reactive to light, neck supple, throat within normal limits Cardiovascular: Normal rate, regular rhythm and normal heart sounds.  No murmur heard. No BLE edema. Pulmonary/Chest: Effort normal and breath sounds normal. No respiratory distress. Abdominal: Soft.  There is no tenderness. Psychiatric: Patient has a normal mood and affect. behavior is normal. Judgment and thought content normal.  PHQ2/9: Depression screen [facility] 2/9 12/04/2018 08/28/2018 05/29/2018 01/11/2018 11/09/2017  Decreased Interest 0 0 0 0 0  Down, Depressed, Hopeless 0 0 0 0 0  PHQ - 2 Score 0 0 0 0 0  Altered sleeping - - 0 - -  Tired, decreased energy - - 0 - -  Change in appetite - - 0 - -  Feeling bad or failure about yourself  - - 0 - -  Trouble concentrating - - 0 - -  Moving slowly or fidgety/restless - - 0 - -  Suicidal thoughts - - 0 - -  PHQ-9 Score - - 0 - -  Difficult doing work/chores - - Not difficult at all - -   GAD 7 : Generalized Anxiety Score 12/04/2018  Nervous, Anxious, on Edge 0  Control/stop worrying 0  Worry too much - different things 0  Trouble relaxing 0  Restless 0  Easily annoyed or irritable 0  Afraid - awful might happen 0  Total GAD 7 Score 0     Fall Risk: Fall Risk  12/04/2018 08/28/2018 05/29/2018 01/11/2018 11/09/2017  Falls in the past year? 0 No No No No  Number falls in past yr: 0 - - - -  Injury  with Fall? 0 - - - -      Assessment & Plan  1. Chronic bilateral low back pain without sciatica  - cyclobenzaprine (FLEXERIL) 10 MG tablet; Take 0.5-1 tablets (5-10 mg total) by mouth at bedtime.  Dispense: 90 tablet; Refill: 0  2. Need for Tdap vaccination  - Tdap vaccine greater than or equal to 7yo IM  3. Panic attack  - ALPRAZolam (XANAX) 0.5 MG tablet; Take 1 tablet (0.5 mg total) by mouth as needed.  Dispense: 30 tablet; Refill: 0  4. Attention deficit hyperactivity disorder (ADHD), predominantly hyperactive type  - amphetamine-dextroamphetamine (ADDERALL XR) 25 MG 24 hr capsule; Take 1 capsule by mouth every morning.  Dispense: 30 capsule; Refill: 0 - amphetamine-dextroamphetamine (ADDERALL XR) 25 MG 24 hr capsule; Take 1 capsule by mouth every morning.  Dispense: 30 capsule; Refill: 0 - amphetamine-dextroamphetamine (ADDERALL XR) 25 MG 24 hr capsule; Take 1 capsule by mouth every morning.  Dispense: 30 capsule; Refill: 0 - amphetamine-dextroamphetamine (ADDERALL) 15 MG tablet; Take 1 tablet by mouth daily.  Dispense: 30 tablet; Refill: 0 - amphetamine-dextroamphetamine (ADDERALL) 15 MG tablet; Take 1 tablet by mouth daily.  Dispense: 30 tablet; Refill: 0 - amphetamine-dextroamphetamine (ADDERALL) 15 MG tablet; Take 1 tablet by mouth daily. At lunch-when working long hours  Dispense: 30 tablet; Refill: 0

## 2018-12-26 ENCOUNTER — Encounter: Payer: Self-pay | Admitting: Emergency Medicine

## 2018-12-26 ENCOUNTER — Other Ambulatory Visit: Payer: Self-pay

## 2018-12-26 ENCOUNTER — Emergency Department
Admission: EM | Admit: 2018-12-26 | Discharge: 2018-12-26 | Disposition: A | Discharge status: Home / Self Care | Payer: BLUE CROSS/BLUE SHIELD | Attending: Emergency Medicine | Admitting: Emergency Medicine

## 2018-12-26 ENCOUNTER — Emergency Department: Payer: BLUE CROSS/BLUE SHIELD

## 2018-12-26 DIAGNOSIS — F419 Anxiety disorder, unspecified: Secondary | ICD-10-CM | POA: Insufficient documentation

## 2018-12-26 DIAGNOSIS — Z79899 Other long term (current) drug therapy: Secondary | ICD-10-CM | POA: Diagnosis not present

## 2018-12-26 DIAGNOSIS — F17220 Nicotine dependence, chewing tobacco, uncomplicated: Secondary | ICD-10-CM | POA: Diagnosis not present

## 2018-12-26 DIAGNOSIS — R0789 Other chest pain: Secondary | ICD-10-CM | POA: Insufficient documentation

## 2018-12-26 DIAGNOSIS — R0602 Shortness of breath: Secondary | ICD-10-CM | POA: Diagnosis not present

## 2018-12-26 DIAGNOSIS — R079 Chest pain, unspecified: Secondary | ICD-10-CM | POA: Diagnosis not present

## 2018-12-26 DIAGNOSIS — F410 Panic disorder [episodic paroxysmal anxiety] without agoraphobia: Secondary | ICD-10-CM | POA: Diagnosis present

## 2018-12-26 LAB — CBC
HCT: 44 % (ref 39.0–52.0)
Hemoglobin: 14.9 g/dL (ref 13.0–17.0)
MCH: 29.6 pg (ref 26.0–34.0)
MCHC: 33.9 g/dL (ref 30.0–36.0)
MCV: 87.5 fL (ref 80.0–100.0)
PLATELETS: 240 10*3/uL (ref 150–400)
RBC: 5.03 MIL/uL (ref 4.22–5.81)
RDW: 12.6 % (ref 11.5–15.5)
WBC: 5.7 10*3/uL (ref 4.0–10.5)
nRBC: 0 % (ref 0.0–0.2)

## 2018-12-26 LAB — BASIC METABOLIC PANEL
Anion gap: 9 (ref 5–15)
BUN: 11 mg/dL (ref 6–20)
CO2: 25 mmol/L (ref 22–32)
Calcium: 9.5 mg/dL (ref 8.9–10.3)
Chloride: 103 mmol/L (ref 98–111)
Creatinine, Ser: 1 mg/dL (ref 0.61–1.24)
GFR calc Af Amer: >60 mL/min (ref >60)
GFR calc non Af Amer: >60 mL/min (ref >60)
Glucose, Bld: 114 mg/dL — ABNORMAL HIGH (ref 70–99)
Potassium: 3.3 mmol/L — ABNORMAL LOW (ref 3.5–5.1)
SODIUM: 137 mmol/L (ref 135–145)

## 2018-12-26 LAB — TROPONIN I: Troponin I: <0.03 ng/mL (ref <0.03)

## 2018-12-26 MED ORDER — ALPRAZOLAM 0.5 MG PO TABS
1.0000 mg | ORAL_TABLET | Freq: Once | ORAL | Status: AC
  Administered 2018-12-26: 1 mg via ORAL
  Filled 2018-12-26: qty 2

## 2018-12-26 MED ORDER — ALPRAZOLAM 0.5 MG PO TABS: 0.5000 mg | ORAL_TABLET | ORAL | 0 refills | Status: DC | PRN

## 2018-12-26 MED ORDER — SODIUM CHLORIDE 0.9% FLUSH: 3.0000 mL | Freq: Once | INTRAVENOUS | Status: DC

## 2018-12-26 NOTE — ED Notes (Signed)
Pt reports that his chest is tightening up again. Will repeat EKG.

## 2018-12-26 NOTE — ED Triage Notes (Signed)
Pt states he woke up having a panic attack and shob, then at work today he began feeling chest tightness, tingling in bilateral arms and face, felt his heart pounding, states this has never happened before, appears in NAD.

## 2018-12-26 NOTE — ED Notes (Signed)
See triage note    States he developed some chest tightness in mid chest with some SOB  States sxs' started while driving

## 2018-12-26 NOTE — ED Notes (Signed)
Pt reports intermittent chest pains.  Unlabored, NAD. Color WNL. EKG repeated while waiting.  Labs drawn at 1240, not time for repeat troponin.  Initial chest pain started at 0630 AM. VSS

## 2018-12-26 NOTE — ED Provider Notes (Signed)
[facility] Emergency Department Provider Note   ____________________________________________   First MD Initiated Contact with Patient 12/26/18 1431     (approximate)  I have reviewed the triage vital signs and the nursing notes.   HISTORY  Chief Complaint Chest Pain    HPI William Rivers is a 28 y.o. male patient waking this morning with a panic attack.  Patient state associated signs consistent bilateral tingling upper extremities.  Increase heart rate.  Tingling in the lips.  Shortness of breath.  Patient states that he come down and attempt to go to work and his symptoms reoccurred.  Patient has a history of anxiety.  Patient used to take Zantac but was weaned off the medications fell 6 months ago.  Unsure of trigger for this attack.  Patient request reassurance that he is not having a heart attack.   Patient rates his pain discomfort as a 4/10.  Patient describes his discomfort as "anxious".  No palliative measure for complaint.   Past Medical History:  Diagnosis Date  . ADD (attention deficit disorder)   . Allergy   . Anxiety   . Back pain     Patient Active Problem List   Diagnosis Date Noted  . ADD (attention deficit disorder) 05/29/2015  . Chronic LBP 05/29/2015  . Lactose intolerance 05/29/2015  . Panic attack 05/29/2015  . Perennial allergic rhinitis with seasonal variation 05/29/2015  . Lumbosacral radiculitis 05/29/2015    Past Surgical History:  Procedure Laterality Date  . TONSILLECTOMY    . VASECTOMY Bilateral 02/21/2018    Prior to Admission medications   Medication Sig Start Date End Date Taking? Authorizing Provider  ALPRAZolam Prudy Feeler) 0.5 MG tablet Take 1 tablet (0.5 mg total) by mouth as needed for anxiety. 12/26/18   Joni Reining, PA-C  amphetamine-dextroamphetamine (ADDERALL XR) 25 MG 24 hr capsule Take 1 capsule by mouth every morning. 12/04/18   Alba Cory, MD  amphetamine-dextroamphetamine (ADDERALL XR) 25 MG 24  hr capsule Take 1 capsule by mouth every morning. 12/04/18   Alba Cory, MD  amphetamine-dextroamphetamine (ADDERALL XR) 25 MG 24 hr capsule Take 1 capsule by mouth every morning. 12/04/18   Alba Cory, MD  amphetamine-dextroamphetamine (ADDERALL) 15 MG tablet Take 1 tablet by mouth daily. 12/04/18   Alba Cory, MD  amphetamine-dextroamphetamine (ADDERALL) 15 MG tablet Take 1 tablet by mouth daily. 12/04/18   Alba Cory, MD  amphetamine-dextroamphetamine (ADDERALL) 15 MG tablet Take 1 tablet by mouth daily. At lunch-when working long hours 12/04/18   Alba Cory, MD  Cyanocobalamin (B-12) 1000 MCG SUBL Place 1 tablet under the tongue daily. 05/29/18   Alba Cory, MD  cyclobenzaprine (FLEXERIL) 10 MG tablet Take 0.5-1 tablets (5-10 mg total) by mouth at bedtime. 12/04/18   Alba Cory, MD  levocetirizine (XYZAL) 5 MG tablet Take 1 tablet (5 mg total) by mouth every evening. 11/09/17   Alba Cory, MD    Allergies Patient has no known allergies.  Family History  Problem Relation Age of Onset  . Anxiety disorder Mother     Social History Social History   Tobacco Use  . Smoking status: Never Smoker  . Smokeless tobacco: Current User    Types: Chew  Substance Use Topics  . Alcohol use: Yes    Alcohol/week: 18.0 standard drinks    Types: 6 Cans of beer, 12 Standard drinks or equivalent per week  . Drug use: No    Review of Systems  Constitutional: No fever/chills Eyes: No visual  changes. ENT: No sore throat. Cardiovascular: Denies chest pain. Respiratory: Denies shortness of breath. Gastrointestinal: No abdominal pain.  No nausea, no vomiting.  No diarrhea.  No constipation. Genitourinary: Negative for dysuria. Musculoskeletal: Negative for back pain. Skin: Negative for rash. Neurological: Negative for headaches, focal weakness or numbness. Psychiatric: ADD and anxiety.   ____________________________________________   PHYSICAL EXAM:  VITAL  SIGNS: ED Triage Vitals  Enc Vitals Group     BP 12/26/18 1205 135/78     Pulse Rate 12/26/18 1205 (!) 113     Resp 12/26/18 1205 20     Temp 12/26/18 1205 98.1 F (36.7 C)     Temp Source 12/26/18 1205 Oral     SpO2 12/26/18 1205 100 %     Weight 12/26/18 1206 190 lb (86.2 kg)     Height 12/26/18 1206 6\' 1"  (1.854 m)     Head Circumference --      Peak Flow --      Pain Score 12/26/18 1211 4     Pain Loc --      Pain Edu? --      Excl. in GC? --     Constitutional: Alert and oriented. Well appearing and in no acute distress. Eyes: Conjunctivae are normal. PERRL. EOMI. Head: Atraumatic. Nose: No congestion/rhinnorhea. Mouth/Throat: Mucous membranes are moist.  Oropharynx non-erythematous. Hematological/Lymphatic/Immunilogical: No cervical lymphadenopathy. Cardiovascular: Tachycardic, regular rhythm. Grossly normal heart sounds.  Good peripheral circulation. Respiratory: Normal respiratory effort.  No retractions. Lungs CTAB. Gastrointestinal: Soft and nontender. No distention. No abdominal bruits. No CVA tenderness. Musculoskeletal: No lower extremity tenderness nor edema.  No joint effusions. Neurologic:  Normal speech and language. No gross focal neurologic deficits are appreciated. No gait instability. Skin:  Skin is warm, dry and intact. No rash noted. Psychiatric: Mood and affect are normal. Speech and behavior are normal.  ____________________________________________   LABS (all labs ordered are listed, but only abnormal results are displayed)  Labs Reviewed  BASIC METABOLIC PANEL - Abnormal; Notable for the following components:      Result Value   Potassium 3.3 (*)    Glucose, Bld 114 (*)    All other components within normal limits  CBC  TROPONIN I   ____________________________________________  EKG  Read by heart station Dr. with no acute findings. ____________________________________________  RADIOLOGY  ED MD interpretation:   Official radiology  report(s): Dg Chest 2 View  Result Date: 12/26/2018 CLINICAL DATA:  Shortness of breath, chest tightness. EXAM: CHEST - 2 VIEW COMPARISON:  None. FINDINGS: Cardiomediastinal silhouette is normal. Mediastinal contours appear intact. There is no evidence of focal airspace consolidation, pleural effusion or pneumothorax. Osseous structures are without acute abnormality. Soft tissues are grossly normal. IMPRESSION: No active cardiopulmonary disease. Electronically Signed   By: Ted Mcalpineobrinka  Dimitrova M.D.   On: 12/26/2018 12:35    ____________________________________________   PROCEDURES  Procedure(s) performed: None  Procedures  Critical Care performed: No  ____________________________________________   INITIAL IMPRESSION / ASSESSMENT AND PLAN / ED COURSE  As part of my medical decision making, I reviewed the following data within the electronic MEDICAL RECORD NUMBER     Patient presents with anxiety secondary to panic attack upon a.m. awakening.  Discussed negative cardiac work-up with patient.  It is noted the patient is no longer taking Xanax.  Patient is reassured he is not having a cardiac event.  We will follow-up with PCP to discuss control and panic attacks.      ____________________________________________   FINAL CLINICAL  IMPRESSION(S) / ED DIAGNOSES  Final diagnoses:  Anxiety     ED Discharge Orders         Ordered    ALPRAZolam (XANAX) 0.5 MG tablet  As needed    Note to Pharmacy:  To last until f/u   12/26/18 1450           Note:  This document was prepared using Dragon voice recognition software and may include unintentional dictation errors.    Joni Reining, PA-C 12/26/18 1502    Schaevitz, Myra Rude, MD 12/26/18 [id number]

## 2019-01-09 ENCOUNTER — Telehealth: Payer: Self-pay

## 2019-01-09 NOTE — Telephone Encounter (Signed)
Spoke with William Rivers and he stated that he is doing better today. He is going to call us back to schedule his appt because he is currently out of town. You are completely booked until the end of March. Is it okay for him to see [name] for a sooner appt?

## 2019-01-09 NOTE — Telephone Encounter (Signed)
Copied from CRM [id number]. Topic: Appointment Scheduling - Scheduling Inquiry for Clinic >> Jan 08, 2019 10:42 AM Lynne Logan D wrote: Reason for CRM: Pt called to schedule HFU. He is unable to come on 01/10/19 for Dr. Carlynn Purl first opening. The next opening is towards the end of March. Please advise.

## 2019-01-09 NOTE — Telephone Encounter (Signed)
For panic attack? It was EC follow up, check on him, if he is doing okay it can be march otherwise, at 1 pm?

## 2019-01-10 NOTE — Telephone Encounter (Signed)
Tried to contact patient and was not able to leave vm

## 2019-01-10 NOTE — Telephone Encounter (Signed)
I can send hydroxyzine if he needs to , not a good patient for [name] to see
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
## 2019-01-15 ENCOUNTER — Encounter: Payer: Self-pay | Admitting: Nurse Practitioner

## 2019-01-15 ENCOUNTER — Ambulatory Visit: Payer: BLUE CROSS/BLUE SHIELD | Admitting: Nurse Practitioner

## 2019-01-15 VITALS — BP 122/68 | HR 91 | Temp 98.0°F | Resp 16 | Ht 72.0 in | Wt 202.2 lb

## 2019-01-15 DIAGNOSIS — R079 Chest pain, unspecified: Secondary | ICD-10-CM | POA: Diagnosis not present

## 2019-01-15 DIAGNOSIS — R002 Palpitations: Secondary | ICD-10-CM | POA: Diagnosis not present

## 2019-01-15 DIAGNOSIS — F411 Generalized anxiety disorder: Secondary | ICD-10-CM

## 2019-01-15 DIAGNOSIS — H6122 Impacted cerumen, left ear: Secondary | ICD-10-CM

## 2019-01-15 MED ORDER — BUSPIRONE HCL 7.5 MG PO TABS: 7.5000 mg | ORAL_TABLET | Freq: Two times a day (BID) | ORAL | 1 refills | Status: DC

## 2019-01-15 NOTE — Patient Instructions (Addendum)
- You should hear about labs within the next 1-2 weeks if you do not please reach out. At your one month follow-up we will see if you need a referral to a specialist. If you have severe chest pain, shortness of breath, pass out please seek immediate medical attention.  Palpitations Palpitations are feelings that your heartbeat is irregular or is faster than normal. It may feel like your heart is fluttering or skipping a beat. Palpitations are usually not a serious problem. They may be caused by many things, including smoking, caffeine, alcohol, stress, and certain medicines or drugs. Most causes of palpitations are not serious. However, some palpitations can be a sign of a serious problem. You may need further tests to rule out serious medical problems. Follow these instructions at home: - Drink at least 70 ounces of water a day - Cut caffeine out of your diet completely, work on cutting down over this next week so you do not get a caffeine headache - Cut down alcohol consumption this week and then cut it out completely for the next three weeks until your follow-up appointment - Take buspar twice a day at least 10-12 hours a part. If you have any issues with this medication please let us know right a way.  - Please work on taking the xanax less often.  Pay attention to any changes in your condition. Take these actions to help manage your symptoms: Eating and drinking  Avoid foods and drinks that may cause palpitations. These may include: ? Caffeinated coffee, tea, soft drinks, diet pills, and energy drinks. ? Chocolate. ? Alcohol. Lifestyle  Take steps to reduce your stress and anxiety. Things that can help you relax include: ? Yoga. ? Mind-body activities, such as deep breathing, meditation, or using words and images to create positive thoughts (guided imagery). ? Physical activity, such as swimming, jogging, or walking. Tell your health care provider if your palpitations increase with  activity. If you have chest pain or shortness of breath with activity, do not continue the activity until you are seen by your health care provider. ? Biofeedback. This is a method that helps you learn to use your mind to control things in your body, such as your heartbeat.  Do not use drugs, including cocaine or ecstasy. Do not use marijuana.  Get plenty of rest and sleep. Keep a regular bed time. General instructions  Take over-the-counter and prescription medicines only as told by your health care provider.  Do not use any products that contain nicotine or tobacco, such as cigarettes and e-cigarettes. If you need help quitting, ask your health care provider.  Keep all follow-up visits as told by your health care provider. This is important. These may include visits for further testing if palpitations do not go away or get worse. Contact a health care provider if you:  Continue to have a fast or irregular heartbeat after 24 hours.  Notice that your palpitations occur more often. Get help right away if you:  Have chest pain or shortness of breath.  Have a severe headache.  Feel dizzy or you faint. Summary  Palpitations are feelings that your heartbeat is irregular or is faster than normal. It may feel like your heart is fluttering or skipping a beat.  Palpitations may be caused by many things, including smoking, caffeine, alcohol, stress, certain medicines, and drugs.  Although most causes of palpitations are not serious, some causes can be a sign of a serious medical problem.  Get help  right away if you faint or have chest pain, shortness of breath, a severe headache, or dizziness. This information is not intended to replace advice given to you by your health care provider. Make sure you discuss any questions you have with your health care provider. Document Released: 11/03/2000 Document Revised: 12/19/2017 Document Reviewed: 12/19/2017 Elsevier Interactive Patient Education   2019 [employer].

## 2019-01-16 LAB — COMPLETE METABOLIC PANEL WITH GFR
AG Ratio: 2.4 (calc) (ref 1.0–2.5)
ALKALINE PHOSPHATASE (APISO): 53 U/L (ref 36–130)
ALT: 19 U/L (ref 9–46)
AST: 16 U/L (ref 10–40)
Albumin: 4.5 g/dL (ref 3.6–5.1)
BUN: 12 mg/dL (ref 7–25)
CO2: 29 mmol/L (ref 20–32)
Calcium: 9.7 mg/dL (ref 8.6–10.3)
Chloride: 106 mmol/L (ref 98–110)
Creat: 1.05 mg/dL (ref 0.60–1.35)
GFR, Est African American: 111 mL/min/{1.73_m2} (ref >=60)
GFR, Est Non African American: 95 mL/min/{1.73_m2} (ref >=60)
Globulin: 1.9 g/dL (calc) (ref 1.9–3.7)
Glucose, Bld: 83 mg/dL (ref 65–99)
Potassium: 4.2 mmol/L (ref 3.5–5.3)
Sodium: 143 mmol/L (ref 135–146)
Total Bilirubin: 0.5 mg/dL (ref 0.2–1.2)
Total Protein: 6.4 g/dL (ref 6.1–8.1)

## 2019-01-16 LAB — TSH: TSH: 1.33 mIU/L (ref 0.40–4.50)

## 2019-02-12 ENCOUNTER — Ambulatory Visit: Payer: BLUE CROSS/BLUE SHIELD | Admitting: Nurse Practitioner

## 2019-03-26 ENCOUNTER — Encounter: Payer: Self-pay | Admitting: Family Medicine

## 2019-03-26 ENCOUNTER — Other Ambulatory Visit: Payer: Self-pay

## 2019-03-26 ENCOUNTER — Ambulatory Visit (INDEPENDENT_AMBULATORY_CARE_PROVIDER_SITE_OTHER): Payer: BLUE CROSS/BLUE SHIELD | Admitting: Family Medicine

## 2019-03-26 VITALS — Ht 72.0 in

## 2019-03-26 DIAGNOSIS — F410 Panic disorder [episodic paroxysmal anxiety] without agoraphobia: Secondary | ICD-10-CM | POA: Diagnosis not present

## 2019-03-26 DIAGNOSIS — G8929 Other chronic pain: Secondary | ICD-10-CM

## 2019-03-26 DIAGNOSIS — F901 Attention-deficit hyperactivity disorder, predominantly hyperactive type: Secondary | ICD-10-CM | POA: Diagnosis not present

## 2019-03-26 DIAGNOSIS — M545 Low back pain: Secondary | ICD-10-CM

## 2019-03-26 DIAGNOSIS — E538 Deficiency of other specified B group vitamins: Secondary | ICD-10-CM | POA: Diagnosis not present

## 2019-03-26 DIAGNOSIS — J302 Other seasonal allergic rhinitis: Secondary | ICD-10-CM

## 2019-03-26 DIAGNOSIS — J3089 Other allergic rhinitis: Secondary | ICD-10-CM

## 2019-03-26 MED ORDER — AMPHETAMINE-DEXTROAMPHETAMINE 15 MG PO TABS: 15.0000 mg | ORAL_TABLET | Freq: Every day | ORAL | 0 refills | Status: DC

## 2019-03-26 MED ORDER — ALPRAZOLAM 0.5 MG PO TABS: 0.5000 mg | ORAL_TABLET | ORAL | 0 refills | Status: DC | PRN

## 2019-03-26 MED ORDER — CYCLOBENZAPRINE HCL 10 MG PO TABS: 5.0000 mg | ORAL_TABLET | Freq: Every day | ORAL | 0 refills | Status: DC

## 2019-03-26 MED ORDER — AMPHETAMINE-DEXTROAMPHET ER 25 MG PO CP24: 25.0000 mg | ORAL_CAPSULE | ORAL | 0 refills | Status: DC

## 2019-03-26 MED ORDER — AMPHETAMINE-DEXTROAMPHETAMINE 15 MG PO TABS: 1.0000 | ORAL_TABLET | Freq: Every day | ORAL | 0 refills | Status: DC

## 2019-03-26 MED ORDER — AZELASTINE HCL 0.1 % NA SOLN: 1.0000 | Freq: Two times a day (BID) | NASAL | 2 refills | Status: DC

## 2019-03-26 NOTE — Progress Notes (Signed)
Name: William Rivers   MRN: [id number]    DOB: 1991-07-24   Date:03/26/2019       Progress Note  Subjective  Chief Complaint  Chief Complaint  Patient presents with   Medication Refill   ADHD   Back Pain    Unchanged   Anxiety   Allergic Rhinitis     Sinus Congestion   B12 Deficiency    I connected with  William Rivers  on 03/26/19 at 10:40 AM EDT by a video enabled telemedicine application and verified that I am speaking with the correct person using two identifiers.  I discussed the limitations of evaluation and management by telemedicine and the availability of in person appointments. The patient expressed understanding and agreed to proceed. Staff also discussed with the patient that there may be a patient responsible charge related to this service. Patient Location: at work  Provider Location: [employer]  HPI  ADHD: [name] works long hours, aboutup to12hours per day - because of his side jobs, takes Adderal XR in am and a Adderal 15mg  around 2pm, it helps him stay focused and also helps him with anxiety, stops his mind from worrying His weight has at home was down to 193 lbs a couple of weeks ago, but has been stable in the 190's for a while now.  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   Anemia/B12 deficiency:. Denies pica, he states fatigue is not as intense. He has noticed tingling on both hands. 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 He states he has been taking B12 SL about twice a week. Discussed importance of taking it more often, also discussed again referral to Ortho, he wants to hold off for now secondary to COVID-19   Chronic low back pain: he wakes up feeling very stiff in am's, he is takes Flexeril a few times a week before bed time and helps him sleep better, and wakes up not feeling as sitff. Works as an Personnel officer.He states usually aching on lower back, but sometimes  shoots down right leg, episodes of radiculitis is sporadic.Aggravated by sitting for a prolonged period of time. He states seems to be doing better, still takes it most night of the week  Anxiety: taking Alprazolam prn,30pills for 90 days, he only takes it prn for panic attacks, he denies side effects. He is aware of risk of taking it and driving. Panic attacks have been stable, it happens in spurts, episodes of panic attacks about twice a week. He states last episode was at work, he was working outside with three layers of clothe and had to go to a tight spot and felt claustrophobic. He went to [facility] back in 12/2017 after he had a viral illness that triggered dehydration, and a panic attack, but back to his normal baseline since. During that week he took a few more pills of alprazolam so ran out one week ago, we will go up to 35 since visit to [facility] .   AR: hehas noticed increase in nasal congestion, flonase does not work, advised otc anti-histamines and we will add Astelin    Patient Active Problem List   Diagnosis Date Noted   ADD (attention deficit disorder) 05/29/2015   Chronic LBP 05/29/2015   Lactose intolerance 05/29/2015   Panic attack 05/29/2015   Perennial allergic rhinitis with seasonal variation 05/29/2015   Lumbosacral radiculitis 05/29/2015    Past Surgical History:  Procedure Laterality Date   TONSILLECTOMY  VASECTOMY Bilateral 02/21/2018    Family History  Problem Relation Age of Onset   Anxiety disorder Mother     Social History   Socioeconomic History   Marital status: Single    Spouse name: William Rivers    Number of children: 3   Years of education: Not on file   Highest education level: 12th grade  Occupational History   Occupation: Actuaryelectrician   Social Needs   Financial resource strain: Somewhat hard   Food insecurity:    Worry: Never true    Inability: Never true   Transportation needs:    Medical: No    Non-medical: No  Tobacco  Use   Smoking status: Never Smoker   Smokeless tobacco: Current User    Types: Chew  Substance and Sexual Activity   Alcohol use: Yes    Alcohol/week: 18.0 standard drinks    Types: 6 Cans of beer, 12 Standard drinks or equivalent per week   Drug use: No   Sexual activity: Yes    Partners: Female    Birth control/protection: I.U.D.  Lifestyle   Physical activity:    Days per week: 6 days    Minutes per session: 150+ min   Stress: Not at all  Relationships   Social connections:    Talks on phone: More than three times a week    Gets together: More than three times a week    Attends religious service: Never    Active member of club or organization: No    Attends meetings of clubs or organizations: Never    Relationship status: Living with partner   Intimate partner violence:    Fear of current or ex partner: No    Emotionally abused: No    Physically abused: No    Forced sexual activity: No  Other Topics Concern   Not on file  Social History Narrative   Living with girlfriend - Ave FilterChandler she is mother of one of his kids ( son)    He has two older children from previous relationship.      Current Outpatient Medications:    ALPRAZolam (XANAX) 0.5 MG tablet, Take 1 tablet (0.5 mg total) by mouth as needed for anxiety., Disp: 15 tablet, Rfl: 0   amphetamine-dextroamphetamine (ADDERALL XR) 25 MG 24 hr capsule, Take 1 capsule by mouth every morning., Disp: 30 capsule, Rfl: 0   amphetamine-dextroamphetamine (ADDERALL XR) 25 MG 24 hr capsule, Take 1 capsule by mouth every morning., Disp: 30 capsule, Rfl: 0   amphetamine-dextroamphetamine (ADDERALL XR) 25 MG 24 hr capsule, Take 1 capsule by mouth every morning., Disp: 30 capsule, Rfl: 0   amphetamine-dextroamphetamine (ADDERALL) 15 MG tablet, Take 1 tablet by mouth daily., Disp: 30 tablet, Rfl: 0   amphetamine-dextroamphetamine (ADDERALL) 15 MG tablet, Take 1 tablet by mouth daily., Disp: 30 tablet, Rfl: 0    amphetamine-dextroamphetamine (ADDERALL) 15 MG tablet, Take 1 tablet by mouth daily. At lunch-when working long hours, Disp: 30 tablet, Rfl: 0   Cyanocobalamin (B-12) 1000 MCG SUBL, Place 1 tablet under the tongue daily., Disp: 30 each, Rfl: 5   cyclobenzaprine (FLEXERIL) 10 MG tablet, Take 0.5-1 tablets (5-10 mg total) by mouth at bedtime., Disp: 90 tablet, Rfl: 0   busPIRone (BUSPAR) 7.5 MG tablet, Take 1 tablet (7.5 mg total) by mouth 2 (two) times daily. (Patient not taking: Reported on 03/26/2019), Disp: 60 tablet, Rfl: 1  No Known Allergies  I personally reviewed active problem list, medication list, allergies, family history with the patient/caregiver  today.   ROS  Ten systems reviewed and is negative except as mentioned in HPI   Objective  Virtual encounter, vitals not obtained.  Body mass index is 27.42 kg/m.  Physical Exam  Awake, alert and oriented, no distress  PHQ2/9: Depression screen [facility] 2/9 03/26/2019 01/15/2019 12/04/2018 08/28/2018 05/29/2018  Decreased Interest 0 0 0 0 0  Down, Depressed, Hopeless 0 0 0 0 0  PHQ - 2 Score 0 0 0 0 0  Altered sleeping 0 - - - 0  Tired, decreased energy 0 - - - 0  Change in appetite 0 - - - 0  Feeling bad or failure about yourself  0 - - - 0  Trouble concentrating 3 - - - 0  Moving slowly or fidgety/restless 0 - - - 0  Suicidal thoughts 0 - - - 0  PHQ-9 Score 3 - - - 0  Difficult doing work/chores Not difficult at all - - - Not difficult at all   PHQ-2/9 Result is negative.    Fall Risk: Fall Risk  03/26/2019 01/15/2019 12/04/2018 08/28/2018 05/29/2018  Falls in the past year? 0 0 0 No No  Number falls in past yr: 0 0 0 - -  Injury with Fall? 0 0 0 - -    Assessment & Plan  1. Panic attack  - ALPRAZolam (XANAX) 0.5 MG tablet; Take 1 tablet (0.5 mg total) by mouth as needed for anxiety.  Dispense: 35 tablet; Refill: 0  2. Attention deficit hyperactivity disorder (ADHD), predominantly hyperactive type  -  amphetamine-dextroamphetamine (ADDERALL XR) 25 MG 24 hr capsule; Take 1 capsule by mouth every morning.  Dispense: 30 capsule; Refill: 0 - amphetamine-dextroamphetamine (ADDERALL XR) 25 MG 24 hr capsule; Take 1 capsule by mouth every morning.  Dispense: 30 capsule; Refill: 0 - amphetamine-dextroamphetamine (ADDERALL XR) 25 MG 24 hr capsule; Take 1 capsule by mouth every morning.  Dispense: 30 capsule; Refill: 0 - amphetamine-dextroamphetamine (ADDERALL) 15 MG tablet; Take 1 tablet by mouth daily.  Dispense: 30 tablet; Refill: 0 - amphetamine-dextroamphetamine (ADDERALL) 15 MG tablet; Take 1 tablet by mouth daily.  Dispense: 30 tablet; Refill: 0 - amphetamine-dextroamphetamine (ADDERALL) 15 MG tablet; Take 1 tablet by mouth daily. At lunch-when working long hours  Dispense: 30 tablet; Refill: 0  3. Chronic bilateral low back pain without sciatica  - cyclobenzaprine (FLEXERIL) 10 MG tablet; Take 0.5-1 tablets (5-10 mg total) by mouth at bedtime.  Dispense: 90 tablet; Refill: 0  4. B12 deficiency  Resume replacement   5. Perennial allergic rhinitis with seasonal variation  Tried flonase in the past and did not like it - azelastine (ASTELIN) 0.1 % nasal spray; Place 1 spray into both nostrils 2 (two) times daily. Use in each nostril as directed  Dispense: 30 mL; Refill: 2 I discussed the assessment and treatment plan with the patient. The patient was provided an opportunity to ask questions and all were answered. The patient agreed with the plan and demonstrated an understanding of the instructions.  The patient was advised to call back or seek an in-person evaluation if the symptoms worsen or if the condition fails to improve as anticipated.  I provided 25 minutes of non-face-to-face time during this encounter.

## 2019-04-25 IMAGING — CR DG CHEST 2V
2 series · 2 of 2 positions shown · non-contrast
Comparison: None.

CLINICAL DATA: Shortness of breath, chest tightness.

EXAM:
CHEST - 2 VIEW

[chest pa]
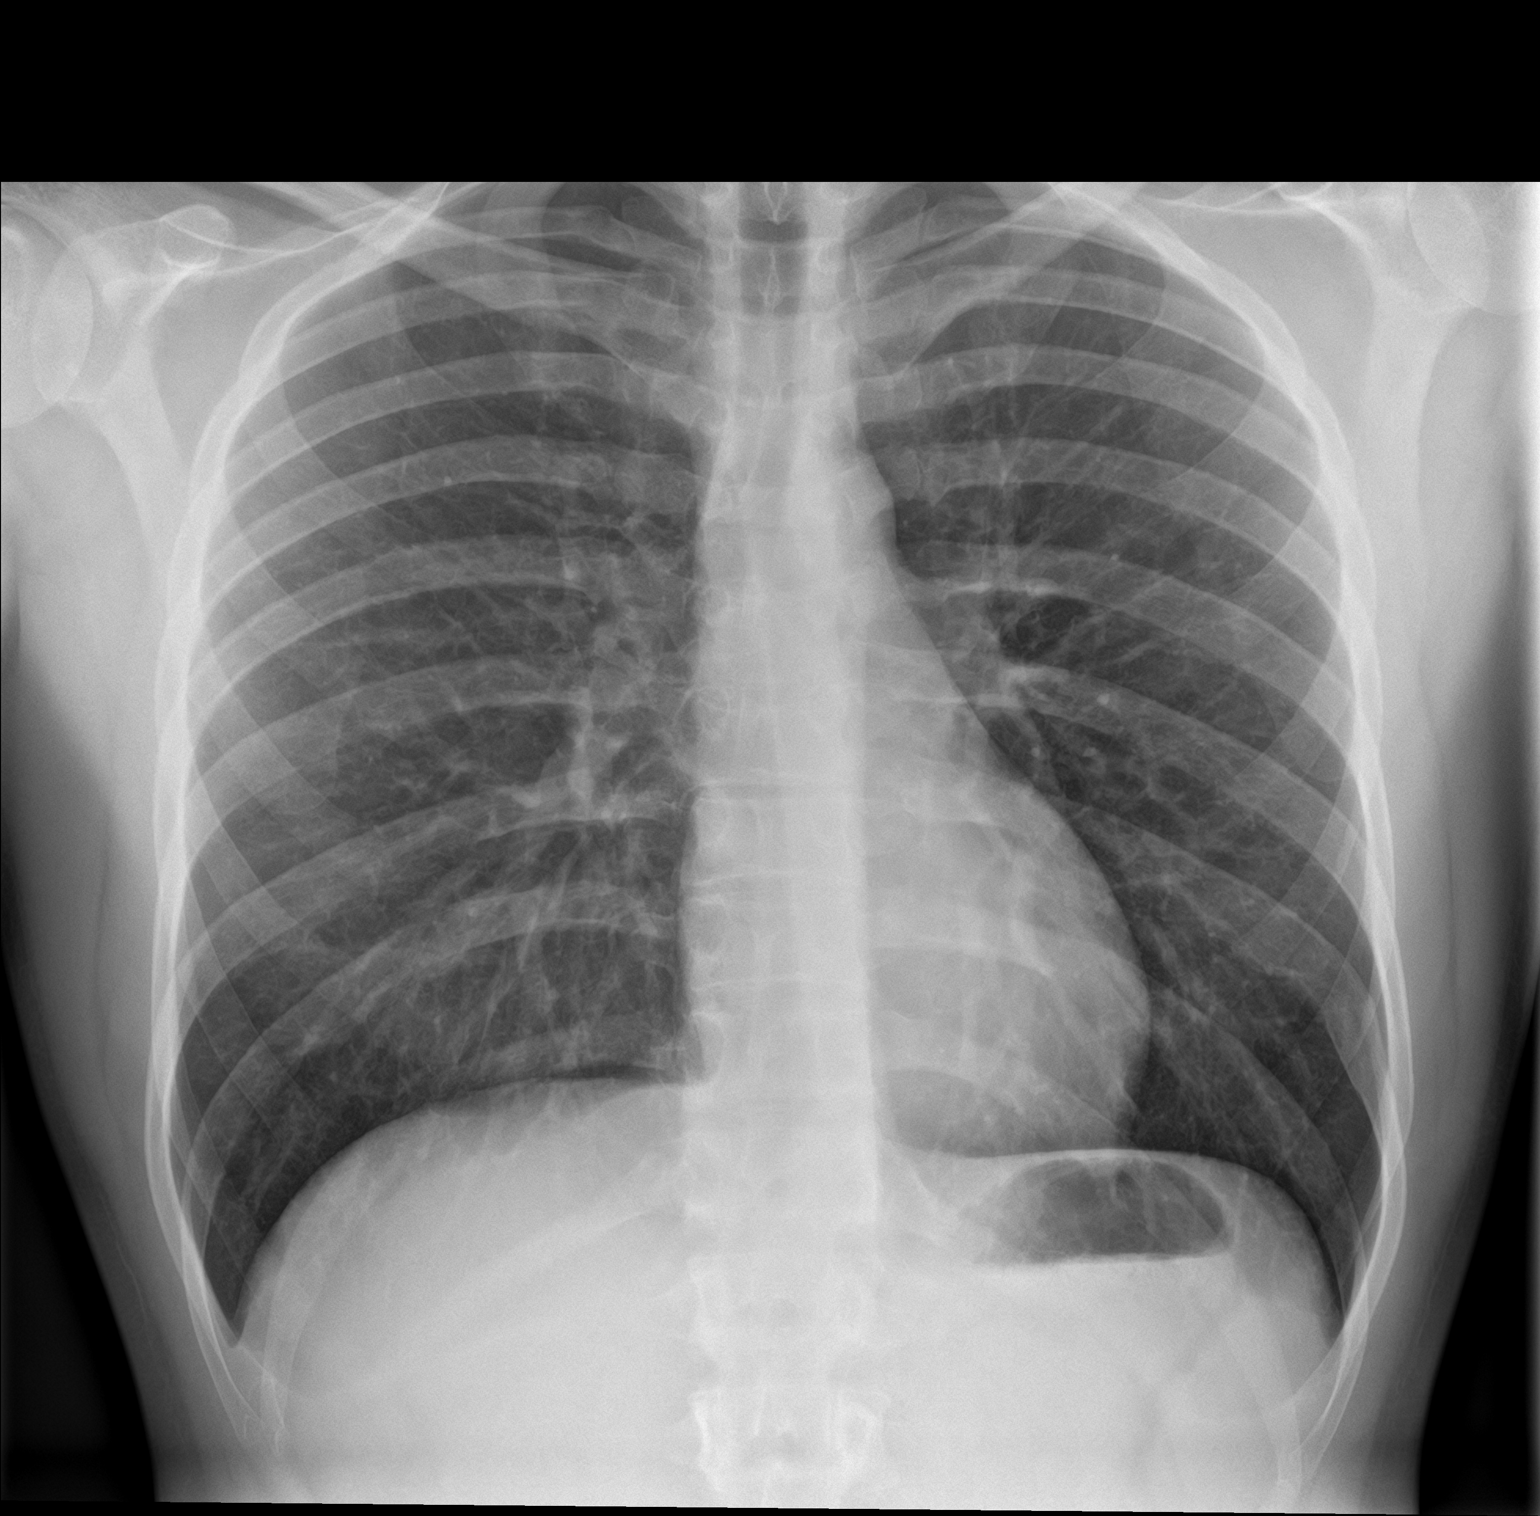

[chest lat]
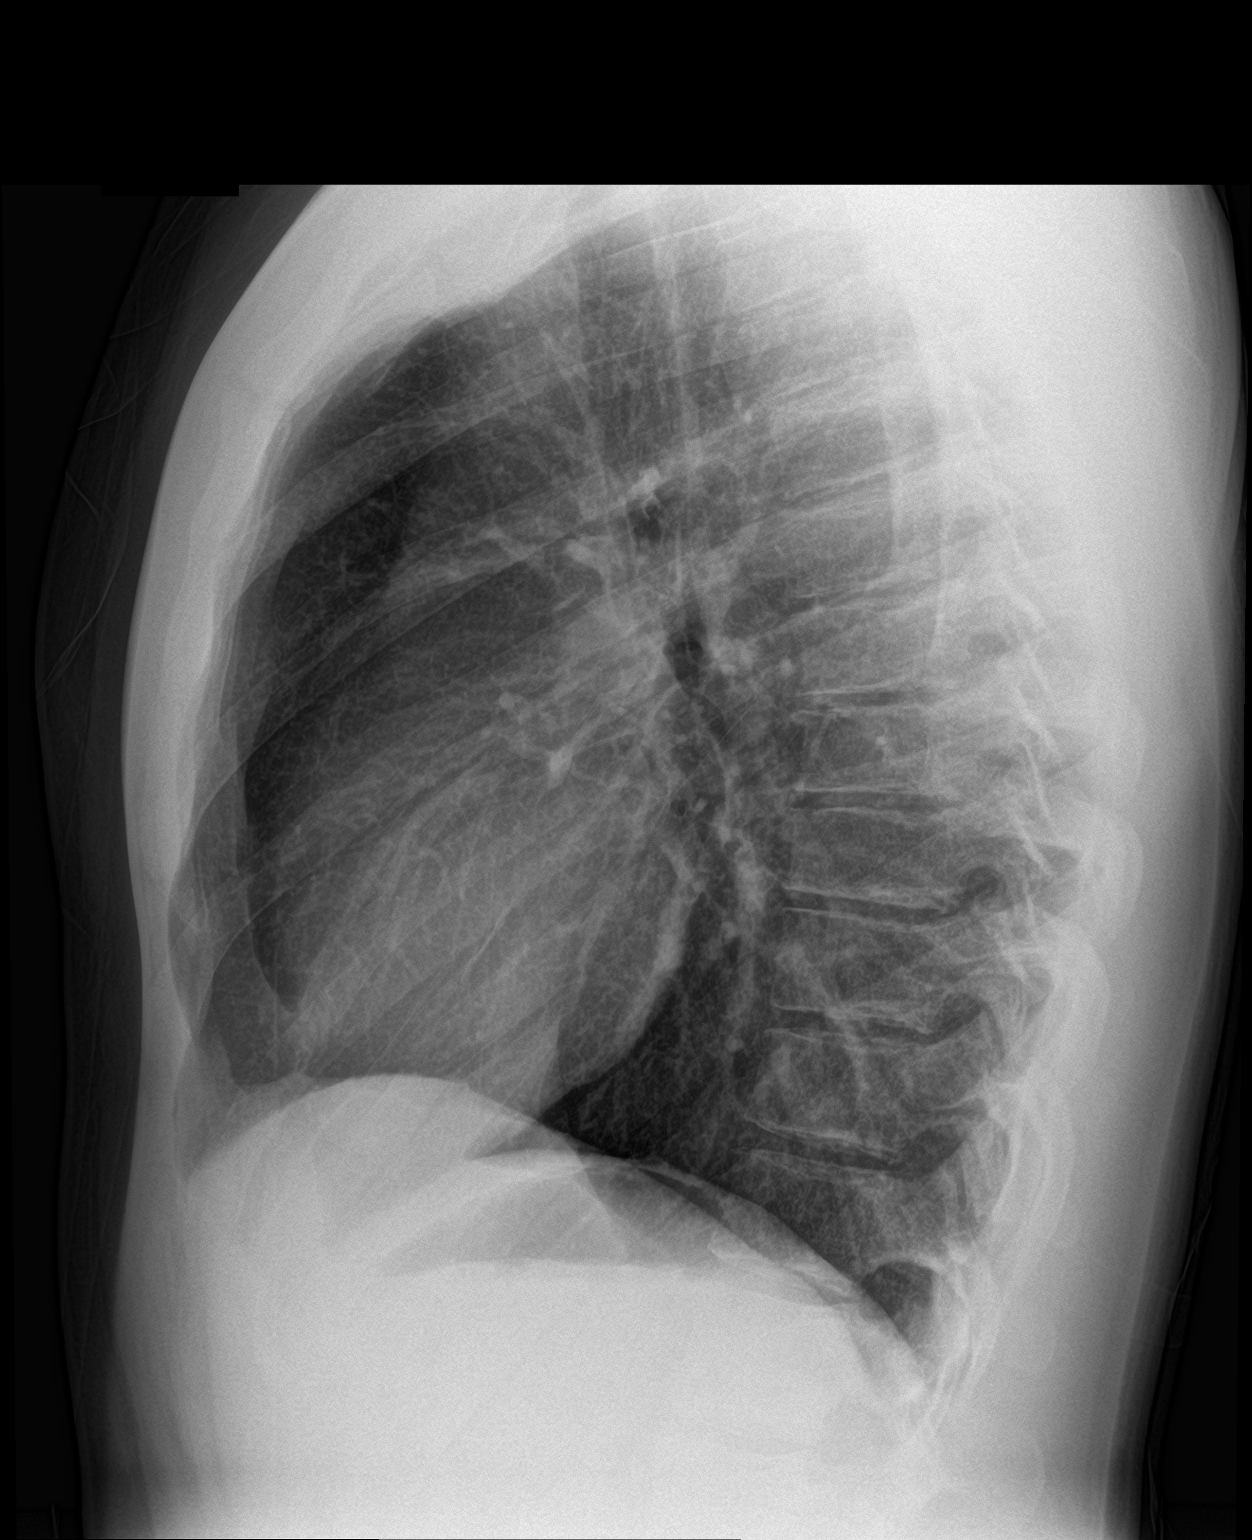

[2 of 2 positions shown; findings below may reference images not displayed]

FINDINGS: Cardiomediastinal silhouette is normal. Mediastinal contours appear
intact.

There is no evidence of focal airspace consolidation, pleural
effusion or pneumothorax.

Osseous structures are without acute abnormality. Soft tissues are
grossly normal.
IMPRESSION: No active cardiopulmonary disease.

## 2019-06-16 ENCOUNTER — Ambulatory Visit (INDEPENDENT_AMBULATORY_CARE_PROVIDER_SITE_OTHER): Payer: BC Managed Care – PPO | Admitting: Family Medicine

## 2019-06-16 ENCOUNTER — Encounter: Payer: Self-pay | Admitting: Family Medicine

## 2019-06-16 ENCOUNTER — Other Ambulatory Visit: Payer: Self-pay

## 2019-06-16 DIAGNOSIS — F410 Panic disorder [episodic paroxysmal anxiety] without agoraphobia: Secondary | ICD-10-CM

## 2019-06-16 DIAGNOSIS — J302 Other seasonal allergic rhinitis: Secondary | ICD-10-CM

## 2019-06-16 DIAGNOSIS — J3089 Other allergic rhinitis: Secondary | ICD-10-CM

## 2019-06-16 DIAGNOSIS — F901 Attention-deficit hyperactivity disorder, predominantly hyperactive type: Secondary | ICD-10-CM

## 2019-06-16 DIAGNOSIS — G8929 Other chronic pain: Secondary | ICD-10-CM

## 2019-06-16 DIAGNOSIS — M545 Low back pain: Secondary | ICD-10-CM

## 2019-06-16 DIAGNOSIS — E538 Deficiency of other specified B group vitamins: Secondary | ICD-10-CM | POA: Diagnosis not present

## 2019-06-16 DIAGNOSIS — F411 Generalized anxiety disorder: Secondary | ICD-10-CM

## 2019-06-16 MED ORDER — AMPHETAMINE-DEXTROAMPHETAMINE 15 MG PO TABS: 15.0000 mg | ORAL_TABLET | Freq: Every day | ORAL | 0 refills | Status: DC

## 2019-06-16 MED ORDER — AMPHETAMINE-DEXTROAMPHETAMINE 15 MG PO TABS: 1.0000 | ORAL_TABLET | Freq: Every day | ORAL | 0 refills | Status: DC

## 2019-06-16 MED ORDER — AMPHETAMINE-DEXTROAMPHET ER 25 MG PO CP24: 25.0000 mg | ORAL_CAPSULE | ORAL | 0 refills | Status: DC

## 2019-06-16 MED ORDER — CYCLOBENZAPRINE HCL 10 MG PO TABS: 5.0000 mg | ORAL_TABLET | Freq: Every day | ORAL | 0 refills | Status: DC

## 2019-06-16 MED ORDER — ALPRAZOLAM 0.5 MG PO TABS: 0.5000 mg | ORAL_TABLET | ORAL | 0 refills | Status: DC | PRN

## 2019-06-16 NOTE — Progress Notes (Signed)
Name: William Rivers   MRN: [id number]    DOB: [birth year]   Date:06/16/2019       Progress Note  Subjective  Chief Complaint  Chief Complaint  Patient presents with  . Medication Refill  . ADHD  . Anxiety  . Back Pain    Unchanged  . Allergic Rhinitis   . Anemia    I connected with  William Rivers  on 06/16/19 at  3:20 PM EDT by a video enabled telemedicine application and verified that I am speaking with the correct person using two identifiers.  I discussed the limitations of evaluation and management by telemedicine and the availability of in person appointments. The patient expressed understanding and agreed to proceed. Staff also discussed with the patient that there may be a patient responsible charge related to this service. 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   Anemia/B12 deficiency:. Denies pica, he states fatigue is not as intense. He has noticed tingling on both hands. 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 Advised to continue taking B12 sublingual and refer to Ortho for wrist pain when he decides to go   Chronic low back pain: he wakes up feeling very stiff in am's, he is takes Flexeril a few times a week before bed time and helps him sleep better, and wakes up not feeling assitff. Works as an Personnel officerelectrician.He states usually aching on lower back, but sometimes shoots down right leg, episodes of radiculitis is sporadic.Aggravated by sitting for a prolonged period of time.Unchanged   Anxiety: taking Alprazolam prn, currently usually a  half prn at night, no recent episodes of panic attacks, 35 pills lasting 90 days  AR: hehas noticed increase in nasal congestion, flonase does not work, advised otc anti-histamines and we will add Astelin   Patient Active Problem List   Diagnosis Date Noted  . ADD (attention deficit disorder) 05/29/2015  . Chronic LBP 05/29/2015  . Lactose intolerance 05/29/2015  . Panic attack 05/29/2015  . Perennial allergic rhinitis with seasonal variation 05/29/2015  . Lumbosacral radiculitis 05/29/2015    Past Surgical History:  Procedure Laterality Date  . TONSILLECTOMY    . VASECTOMY Bilateral 02/21/2018    Family History  Problem Relation Age of Onset  . Anxiety disorder Mother     Social History   Socioeconomic History  . Marital status: Single    Spouse name: Ave FilterChandler   . Number of children: 3  . Years of education: Not on file  . Highest education level: 12th grade  Occupational History  . Occupation: Personnel officerelectrician   Social Needs  . Financial resource strain: Somewhat hard  . Food insecurity    Worry: Never true    Inability: Never true  . Transportation needs    Medical: No    Non-medical: No  Tobacco Use  . Smoking status: Never Smoker  . Smokeless tobacco: Current User    Types: Chew  Substance and Sexual Activity  . Alcohol use: Yes    Alcohol/week: 18.0 standard drinks    Types: 6 Cans of beer, 12 Standard  drinks or equivalent per week  . Drug use: No  . Sexual activity: Yes    Partners: Female    Birth control/protection: I.U.D.  Lifestyle  . Physical activity    Days per week: 6 days    Minutes per session: 150+ min  . Stress: Not at all  Relationships  . Social connections    Talks on phone: More than three times a week    Gets together: More than three times a week    Attends religious service: Never    Active member of club or organization: No    Attends meetings of clubs or organizations: Never    Relationship status: Living with partner  .  Intimate partner violence    Fear of current or ex partner: No    Emotionally abused: No    Physically abused: No    Forced sexual activity: No  Other Topics Concern  . Not on file  Social History Narrative   Living with girlfriend - Tamera Punt she is mother of one of his kids ( son)    He has two older children from previous relationship.      Current Outpatient Medications:  .  ALPRAZolam (XANAX) 0.5 MG tablet, Take 1 tablet (0.5 mg total) by mouth as needed for anxiety., Disp: 35 tablet, Rfl: 0 .  amphetamine-dextroamphetamine (ADDERALL XR) 25 MG 24 hr capsule, Take 1 capsule by mouth every morning., Disp: 30 capsule, Rfl: 0 .  amphetamine-dextroamphetamine (ADDERALL XR) 25 MG 24 hr capsule, Take 1 capsule by mouth every morning., Disp: 30 capsule, Rfl: 0 .  amphetamine-dextroamphetamine (ADDERALL XR) 25 MG 24 hr capsule, Take 1 capsule by mouth every morning., Disp: 30 capsule, Rfl: 0 .  amphetamine-dextroamphetamine (ADDERALL) 15 MG tablet, Take 1 tablet by mouth daily., Disp: 30 tablet, Rfl: 0 .  amphetamine-dextroamphetamine (ADDERALL) 15 MG tablet, Take 1 tablet by mouth daily., Disp: 30 tablet, Rfl: 0 .  amphetamine-dextroamphetamine (ADDERALL) 15 MG tablet, Take 1 tablet by mouth daily. At [place] working long hours, Disp: 30 tablet, Rfl: 0 .  azelastine (ASTELIN) 0.1 % nasal spray, Place 1 spray into both nostrils 2 (two) times daily. Use in each nostril as directed, Disp: 30 mL, Rfl: 2 .  Cyanocobalamin (B-12) 1000 MCG SUBL, Place 1 tablet under the tongue daily., Disp: 30 each, Rfl: 5 .  cyclobenzaprine (FLEXERIL) 10 MG tablet, Take 0.5-1 tablets (5-10 mg total) by mouth at bedtime., Disp: 90 tablet, Rfl: 0  No Known Allergies  I personally reviewed active problem list, medication list, allergies, family history, social history with the patient/caregiver today.   ROS  Ten systems reviewed and is negative except as mentioned in HPI   Objective  Virtual encounter,  vitals not obtained.  There is no height or weight on file to calculate BMI.  Physical Exam  Awake, alert and oriented   PHQ2/9: Depression screen [facility] 2/9 06/16/2019 03/26/2019 01/15/2019 12/04/2018 08/28/2018  Decreased Interest 0 0 0 0 0  Down, Depressed, Hopeless 0 0 0 0 0  PHQ - 2 Score 0 0 0 0 0  Altered sleeping 0 0 - - -  Tired, decreased energy 0 0 - - -  Change in appetite 0 0 - - -  Feeling bad or failure about yourself  0 0 - - -  Trouble concentrating 0 3 - - -  Moving slowly or fidgety/restless 0 0 - - -  Suicidal thoughts 0 0 - - -  PHQ-9 Score 0 3 - - -  Difficult doing work/chores Not difficult at all Not difficult at all - - -   PHQ-2/9 Result is negative.    Fall Risk: Fall Risk  06/16/2019 03/26/2019 01/15/2019 12/04/2018 08/28/2018  Falls in the past year? 0 0 0 0 No  Number falls in past yr: 0 0 0 0 -  Injury with Fall? 0 0 0 0 -     Assessment & Plan  1. Attention deficit hyperactivity disorder (ADHD), predominantly hyperactive type  - amphetamine-dextroamphetamine (ADDERALL XR) 25 MG 24 hr capsule; Take 1 capsule by mouth every morning.  Dispense: 30 capsule; Refill: 0 - amphetamine-dextroamphetamine (ADDERALL XR) 25 MG 24 hr capsule; Take 1 capsule by mouth every morning.  Dispense: 30 capsule; Refill: 0 - amphetamine-dextroamphetamine (ADDERALL XR) 25 MG 24 hr capsule; Take 1 capsule by mouth every morning.  Dispense: 30 capsule; Refill: 0 - amphetamine-dextroamphetamine (ADDERALL) 15 MG tablet; Take 1 tablet by mouth daily.  Dispense: 30 tablet; Refill: 0 - amphetamine-dextroamphetamine (ADDERALL) 15 MG tablet; Take 1 tablet by mouth daily.  Dispense: 30 tablet; Refill: 0 - amphetamine-dextroamphetamine (ADDERALL) 15 MG tablet; Take 1 tablet by mouth daily. At lunch-when working long hours  Dispense: 30 tablet; Refill: 0  2. Chronic bilateral low back pain without sciatica  - cyclobenzaprine (FLEXERIL) 10 MG tablet; Take 0.5-1 tablets (5-10 mg total) by  mouth at bedtime.  Dispense: 90 tablet; Refill: 0  3. B12 deficiency   4. Perennial allergic rhinitis with seasonal variation   5. GAD (generalized anxiety disorder)   6. Panic attack  - ALPRAZolam (XANAX) 0.5 MG tablet; Take 1 tablet (0.5 mg total) by mouth as needed for anxiety.  Dispense: 35 tablet; Refill: 0  I discussed the assessment and treatment plan with the patient. The patient was provided an opportunity to ask questions and all were answered. The patient agreed with the plan and demonstrated an understanding of the instructions.  The patient was advised to call back or seek an in-person evaluation if the symptoms worsen or if the condition fails to improve as anticipated.  I provided 25 minutes of non-face-to-face time during this encounter.

## 2019-07-21 ENCOUNTER — Telehealth: Payer: Self-pay | Admitting: Family Medicine

## 2019-07-21 NOTE — Telephone Encounter (Signed)
Medication Refill - Medication: amphetamine-dextroamphetamine (ADDERALL) 15 MG tablet/amphetamine-dextroamphetamine (ADDERALL XR) 25 MG 24 hr capsule / Pt stated he is going out of town at 5:00am tomorrow morning and would like to know if Dr. Ancil Boozer can send in a refill today. Also pt stated he will be going to [place] on 08/05/19 and would like to know if refill will affect his next one. Please advise.  Has the patient contacted their pharmacy? Yes.   (Agent: If no, request that the patient contact the pharmacy for the refill.) (Agent: If yes, when and what did the pharmacy advise?)  Preferred Pharmacy (with phone number or street name): [place], [place] [phone] (Phone) [phone] (Fax)     Agent: Please be advised that RX refills may take up to 3 business days. We ask that you follow-up with your pharmacy.

## 2019-07-21 NOTE — Telephone Encounter (Signed)
Pt calling and want to know the status of this refill request. Please call pt to advise,

## 2019-07-23 NOTE — Telephone Encounter (Signed)
Pt states the reason he had called the other day is he is working out of town and needed authorization to get his adderral approved to pick up early. Pt states he called 4-5 times that day and was told to keep checking back.  Since this was not done, pt is requesting a new rx be sent to where he is working out of town, to  [place], [place], [place] [address]  Phone: [phone]  amphetamine-dextroamphetamine (ADDERALL XR) 25 MG 24 hr capsule  amphetamine-dextroamphetamine (ADDERALL) 15 MG tablet

## 2019-08-08 DIAGNOSIS — Z20828 Contact with and (suspected) exposure to other viral communicable diseases: Secondary | ICD-10-CM | POA: Diagnosis not present

## 2019-08-11 ENCOUNTER — Other Ambulatory Visit: Payer: Self-pay | Admitting: Family Medicine

## 2019-08-11 DIAGNOSIS — F901 Attention-deficit hyperactivity disorder, predominantly hyperactive type: Secondary | ICD-10-CM

## 2019-08-11 NOTE — Telephone Encounter (Signed)
Medication:  amphetamine-dextroamphetamine (ADDERALL) 15 MG  amphetamine-dextroamphetamine (ADDERALL XR) 25 MG 24 hr capsule   Patient is requesting refill.     Pharmacy: [place], [place]

## 2019-08-12 NOTE — Telephone Encounter (Signed)
°  Pt called back, his question is he is going on his honeymoon out of the country and is asking if he can get a early refill, would like to pick up Sept 26 2020 on his amphetamine adderall 15mg  and 25 mg

## 2019-08-12 NOTE — Telephone Encounter (Signed)
Requested medication (s) are due for refill today: yes  Requested medication (s) are on the active medication list: yes  Last refill:  06/16/2019  Future visit scheduled: no  Notes to clinic: Not delegated    Requested Prescriptions  Pending Prescriptions Disp Refills   amphetamine-dextroamphetamine (ADDERALL) 15 MG tablet 30 tablet 0    Sig: Take 1 tablet by mouth daily. At [place] working long hours     Not Delegated - Psychiatry:  Stimulants/ADHD Failed - 08/12/2019 11:32 AM      Failed - This refill cannot be delegated      Failed - Urine Drug Screen completed in last 360 days.      Passed - Valid encounter within last 3 months    Recent Outpatient Visits          1 month ago Attention deficit hyperactivity disorder (ADHD), predominantly hyperactive type   [facility] Steele Sizer, MD   4 months ago B12 deficiency   [facility] Steele Sizer, MD   6 months ago Palpitations   [facility] S[facility]anne Cheshire E, NP   8 months ago Chronic bilateral low back pain without sciatica   [facility] Steele Sizer, MD   11 months ago Attention deficit hyperactivity disorder (ADHD), predominantly hyperactive type   [facility] [place], Drue Stager, MD              amphetamine-dextroamphetamine (ADDERALL XR) 25 MG 24 hr capsule 30 capsule 0    Sig: Take 1 capsule by mouth every morning.     Not Delegated - Psychiatry:  Stimulants/ADHD Failed - 08/12/2019 11:32 AM      Failed - This refill cannot be delegated      Failed - Urine Drug Screen completed in last 360 days.      Passed - Valid encounter within last 3 months    Recent Outpatient Visits          1 month ago Attention deficit hyperactivity disorder (ADHD), predominantly hyperactive type   [facility] Steele Sizer, MD   4 months ago B12 deficiency   [facility] Steele Sizer, MD   6 months ago Palpitations   [place], NP   8 months ago Chronic bilateral low back pain without sciatica   [facility] Steele Sizer, MD   11 months ago Attention deficit hyperactivity disorder (ADHD), predominantly hyperactive type   [facility] Steele Sizer, MD

## 2019-08-12 NOTE — Telephone Encounter (Signed)
Spoke with pharmacist he does have refills however, he has to call the morning of to request them to refill it. Tried to call patient to inform him he is able to call as early as 9 a.m. on August 22, 2019 to get his prescription refilled but his voicemail was full. Pharmacist states there will not be any gaps in his medication filling it that day.

## 2019-08-12 NOTE — Telephone Encounter (Signed)
Copied from [place] [id number]. Topic: Quick Sport and exercise psychologist Patient (Clinic Use ONLY) >> Aug 12, 2019 11:32 AM Vonna Kotyk, CMA wrote: Reason for CRM: Spoke with pharmacist he does have refills however, he has to call the morning of to request them to refill it. Tried to call patient to inform him he is able to call as early as 9 a.m. on August 22, 2019 to get his prescription refilled but his voicemail was full. Pharmacist states there will not be any gaps in his medication filling it that day. >> Aug 12, 2019 11:50 AM Carolyn Stare wrote:   Pt called back, his question is he is going [place] his [place]ymoon out of the country and is asking if he can get a early refill, would like to pick up Sept 26 2020 on his amphetamine adderall 15mg  and 25 mg

## 2019-08-14 NOTE — Telephone Encounter (Signed)
Left message giving William Rivers permission per Dr. Ancil Boozer for patient to pick up early (Adderall 15 and 25) due to leaving the country early for his honeymoon on September 26.

## 2019-09-22 ENCOUNTER — Encounter: Payer: Self-pay | Admitting: Family Medicine

## 2019-09-22 ENCOUNTER — Other Ambulatory Visit: Payer: Self-pay

## 2019-09-22 ENCOUNTER — Ambulatory Visit: Payer: BC Managed Care – PPO | Admitting: Family Medicine

## 2019-09-22 VITALS — BP 128/84 | HR 92 | Temp 97.5°F | Resp 16 | Ht 72.0 in | Wt 201.6 lb

## 2019-09-22 DIAGNOSIS — F901 Attention-deficit hyperactivity disorder, predominantly hyperactive type: Secondary | ICD-10-CM | POA: Diagnosis not present

## 2019-09-22 DIAGNOSIS — J302 Other seasonal allergic rhinitis: Secondary | ICD-10-CM

## 2019-09-22 DIAGNOSIS — J3089 Other allergic rhinitis: Secondary | ICD-10-CM

## 2019-09-22 DIAGNOSIS — E538 Deficiency of other specified B group vitamins: Secondary | ICD-10-CM

## 2019-09-22 DIAGNOSIS — Z23 Encounter for immunization: Secondary | ICD-10-CM

## 2019-09-22 DIAGNOSIS — M545 Low back pain: Secondary | ICD-10-CM

## 2019-09-22 DIAGNOSIS — G8929 Other chronic pain: Secondary | ICD-10-CM

## 2019-09-22 DIAGNOSIS — F410 Panic disorder [episodic paroxysmal anxiety] without agoraphobia: Secondary | ICD-10-CM

## 2019-09-22 DIAGNOSIS — F411 Generalized anxiety disorder: Secondary | ICD-10-CM

## 2019-09-22 MED ORDER — AZELASTINE HCL 0.1 % NA SOLN: 1.0000 | Freq: Two times a day (BID) | NASAL | 2 refills | Status: DC

## 2019-09-22 MED ORDER — ALPRAZOLAM 0.5 MG PO TABS: 0.5000 mg | ORAL_TABLET | ORAL | 0 refills | Status: DC | PRN

## 2019-09-22 MED ORDER — AMPHETAMINE-DEXTROAMPHET ER 25 MG PO CP24: 25.0000 mg | ORAL_CAPSULE | ORAL | 0 refills | Status: DC

## 2019-09-22 MED ORDER — AMPHETAMINE-DEXTROAMPHETAMINE 15 MG PO TABS: 15.0000 mg | ORAL_TABLET | Freq: Every day | ORAL | 0 refills | Status: DC

## 2019-09-22 MED ORDER — CYCLOBENZAPRINE HCL 10 MG PO TABS: 5.0000 mg | ORAL_TABLET | Freq: Every day | ORAL | 0 refills | Status: DC

## 2019-09-22 MED ORDER — AMPHETAMINE-DEXTROAMPHETAMINE 15 MG PO TABS: 1.0000 | ORAL_TABLET | Freq: Every day | ORAL | 0 refills | Status: DC

## 2019-09-22 MED ORDER — B-12 1000 MCG SL SUBL: 1.0000 | SUBLINGUAL_TABLET | Freq: Every day | SUBLINGUAL | 1 refills | Status: DC

## 2019-09-22 NOTE — Progress Notes (Signed)
Name: William Rivers   MRN: [id number]    DOB: 09/23/1991   Date:09/22/2019       Progress Note  Subjective  Chief Complaint  Chief Complaint  Patient presents with  . Medication Refill  . ADHD  . Anxiety  . Back Pain  . Allergic Rhinitis   . Anemia    HPI  ADHD: William Rivers works long hours, aboutup to12hours per day , takes Adderal XR in am and a Adderal 15mg  around 2pm, it helps him stay focused and also helps him with anxiety, stops his mind from worrying His weight has been stable.He is stillworking for his father -in-law  [employer]. He needs refills today   Anemia/B12 deficiency: last CBC was normal, not feeling as tired, explained importance of taking B12 supplementation otc, specially because of sensation problems in his hands  Hand stiffness: he works as an Personnel officerelectrician, he has noticed inability to feel a lot of pain on his hands, no tingling or numbness, but feels stiff and swollen on his hands all day long. He states he has been taking Aleve every other day to control symptoms, he has intermittent redness but no increase in warmth Explained symptoms seems to be more of osteoarthritis, advised to take Tylenol 500 mg at night and use warm water to wash hands in am, and avoid nsaid's daily .   Chronic low back pain: he wakes up feeling very stiff in am's, he is taking Flexeril most days of the week now. Works as an Personnel officerelectrician.He states usually aching on lower back, but sometimes shoots down right leg, episodes of radiculitis is sporadic.Aggravated by sitting for a prolonged period of time.  Anxiety: taking Alprazolam prn, currently usually a half prn at night, no recent episodes of panic attacks, 35 pills lasting 90 days, he states it comes in spells, random episodes but not as often lately. Last episode was at work last week [facility] - he felt like to walls were closing in, he went outside and talked himself out of it.   AR: hehas noticed  increase in nasal congestion, flonase does not work, advised otc anti-histamines , he is now on Astelin and states it has been working better for him   Patient Active Problem List   Diagnosis Date Noted  . ADD (attention deficit disorder) 05/29/2015  . Chronic LBP 05/29/2015  . Lactose intolerance 05/29/2015  . Panic attack 05/29/2015  . Perennial allergic rhinitis with seasonal variation 05/29/2015  . Lumbosacral radiculitis 05/29/2015    Past Surgical History:  Procedure Laterality Date  . TONSILLECTOMY    . VASECTOMY Bilateral 02/21/2018    Family History  Problem Relation Age of Onset  . Anxiety disorder Mother     Social History   Socioeconomic History  . Marital status: Married    Spouse name: William Rivers   . Number of children: 3  . Years of education: Not on file  . Highest education level: 12th grade  Occupational History  . Occupation: Personnel officerelectrician   Social Needs  . Financial resource strain: Somewhat hard  . Food insecurity    Worry: Never true    Inability: Never true  . Transportation needs    Medical: No    Non-medical: No  Tobacco Use  . Smoking status: Never Smoker  . Smokeless tobacco: Current User    Types: Chew  Substance and Sexual Activity  . Alcohol use: Yes    Alcohol/week: 18.0 standard drinks    Types: 6 Cans  of beer, 12 Standard drinks or equivalent per week  . Drug use: No  . Sexual activity: Yes    Partners: Female    Birth control/protection: I.U.D.  Lifestyle  . Physical activity    Days per week: 6 days    Minutes per session: 150+ min  . Stress: Not at all  Relationships  . Social connections    Talks on phone: More than three times a week    Gets together: More than three times a week    Attends religious service: Never    Active member of club or organization: No    Attends meetings of clubs or organizations: Never    Relationship status: Living with partner  . Intimate partner violence    Fear of current or ex partner:  No    Emotionally abused: No    Physically abused: No    Forced sexual activity: No  Other Topics Concern  . Not on file  Social History Narrative   Living with wife  - William Rivers she is mother of one of his kids ( son)    He has two older children from previous relationship.      Current Outpatient Medications:  .  ALPRAZolam (XANAX) 0.5 MG tablet, Take 1 tablet (0.5 mg total) by mouth as needed for anxiety., Disp: 35 tablet, Rfl: 0 .  amphetamine-dextroamphetamine (ADDERALL XR) 25 MG 24 hr capsule, Take 1 capsule by mouth every morning., Disp: 30 capsule, Rfl: 0 .  amphetamine-dextroamphetamine (ADDERALL XR) 25 MG 24 hr capsule, Take 1 capsule by mouth every morning., Disp: 30 capsule, Rfl: 0 .  amphetamine-dextroamphetamine (ADDERALL XR) 25 MG 24 hr capsule, Take 1 capsule by mouth every morning., Disp: 30 capsule, Rfl: 0 .  amphetamine-dextroamphetamine (ADDERALL) 15 MG tablet, Take 1 tablet by mouth daily., Disp: 30 tablet, Rfl: 0 .  amphetamine-dextroamphetamine (ADDERALL) 15 MG tablet, Take 1 tablet by mouth daily., Disp: 30 tablet, Rfl: 0 .  amphetamine-dextroamphetamine (ADDERALL) 15 MG tablet, Take 1 tablet by mouth daily. At [place] working long hours, Disp: 30 tablet, Rfl: 0 .  azelastine (ASTELIN) 0.1 % nasal spray, Place 1 spray into both nostrils 2 (two) times daily. Use in each nostril as directed, Disp: 30 mL, Rfl: 2 .  Cyanocobalamin (B-12) 1000 MCG SUBL, Place 1 tablet under the tongue daily., Disp: 30 each, Rfl: 5 .  cyclobenzaprine (FLEXERIL) 10 MG tablet, Take 0.5-1 tablets (5-10 mg total) by mouth at bedtime., Disp: 90 tablet, Rfl: 0  No Known Allergies  I personally reviewed active problem list, medication list, allergies, family history, social history, health maintenance with the patient/caregiver today.   ROS  Constitutional: Negative for fever , positive for mild weight gain since last year. Respiratory: Negative for cough and shortness of breath.    Cardiovascular: Negative for chest pain or palpitations.  Gastrointestinal: Negative for abdominal pain, no bowel changes.  Musculoskeletal: Negative for gait problem or joint swelling.  Skin: Negative for rash.  Neurological: Negative for dizziness or headache.  No other specific complaints in a complete review of systems (except as listed in HPI above).  Objective  Vitals:   09/22/19 0907  BP: 128/84  Pulse: 92  Resp: 16  Temp: (!) 97.5 F (36.4 C)  TempSrc: Temporal  SpO2: 99%  Weight: 201 lb 9.6 oz (91.4 kg)  Height: 6' (1.829 m)    Body mass index is 27.34 kg/m.  Physical Exam  Constitutional: Patient appears well-developed and well-nourished. No distress.  HEENT: head  atraumatic, normocephalic, pupils equal and reactive to light,  neck supple, oral mucosa not done  Cardiovascular: Normal rate, regular rhythm and normal heart sounds.  No murmur heard. No BLE edema. Pulmonary/Chest: Effort normal and breath sounds normal. No respiratory distress. Abdominal: Soft.  There is no tenderness. Psychiatric: Patient has a normal mood and affect. behavior is normal. Judgment and thought content normal.   PHQ2/9: Depression screen [facility] 2/9 09/22/2019 06/16/2019 03/26/2019 01/15/2019 12/04/2018  Decreased Interest 0 0 0 0 0  Down, Depressed, Hopeless 0 0 0 0 0  PHQ - 2 Score 0 0 0 0 0  Altered sleeping 0 0 0 - -  Tired, decreased energy 0 0 0 - -  Change in appetite 0 0 0 - -  Feeling bad or failure about yourself  0 0 0 - -  Trouble concentrating 0 0 3 - -  Moving slowly or fidgety/restless 0 0 0 - -  Suicidal thoughts 0 0 0 - -  PHQ-9 Score 0 0 3 - -  Difficult doing work/chores Not difficult at all Not difficult at all Not difficult at all - -    phq 9 is negative   Fall Risk: Fall Risk  09/22/2019 06/16/2019 03/26/2019 01/15/2019 12/04/2018  Falls in the past year? 0 0 0 0 0  Number falls in past yr: 0 0 0 0 0  Injury with Fall? 0 0 0 0 0     Assessment & Plan  1.  Attention deficit hyperactivity disorder (ADHD), predominantly hyperactive type  - amphetamine-dextroamphetamine (ADDERALL) 15 MG tablet; Take 1 tablet by mouth daily. At lunch-when working long hours  Dispense: 30 tablet; Refill: 0 - amphetamine-dextroamphetamine (ADDERALL XR) 25 MG 24 hr capsule; Take 1 capsule by mouth every morning.  Dispense: 30 capsule; Refill: 0 - amphetamine-dextroamphetamine (ADDERALL XR) 25 MG 24 hr capsule; Take 1 capsule by mouth every morning.  Dispense: 30 capsule; Refill: 0 - amphetamine-dextroamphetamine (ADDERALL XR) 25 MG 24 hr capsule; Take 1 capsule by mouth every morning.  Dispense: 30 capsule; Refill: 0 - amphetamine-dextroamphetamine (ADDERALL) 15 MG tablet; Take 1 tablet by mouth daily.  Dispense: 30 tablet; Refill: 0 - amphetamine-dextroamphetamine (ADDERALL) 15 MG tablet; Take 1 tablet by mouth daily.  Dispense: 30 tablet; Refill: 0  2. Perennial allergic rhinitis with seasonal variation  - azelastine (ASTELIN) 0.1 % nasal spray; Place 1 spray into both nostrils 2 (two) times daily. Use in each nostril as directed  Dispense: 30 mL; Refill: 2  3. GAD (generalized anxiety disorder)  Doing better   4. B12 deficiency  - Cyanocobalamin (B-12) 1000 MCG SUBL; Place 1 tablet under the tongue daily.  Dispense: 100 tablet; Refill: 1  5. Panic attack  - ALPRAZolam (XANAX) 0.5 MG tablet; Take 1 tablet (0.5 mg total) by mouth as needed for anxiety.  Dispense: 35 tablet; Refill: 0  6. Chronic bilateral low back pain without sciatica  - cyclobenzaprine (FLEXERIL) 10 MG tablet; Take 0.5-1 tablets (5-10 mg total) by mouth at bedtime.  Dispense: 90 tablet; Refill: 0  7. Needs flu shot  refused

## 2019-12-11 ENCOUNTER — Ambulatory Visit (INDEPENDENT_AMBULATORY_CARE_PROVIDER_SITE_OTHER): Payer: BC Managed Care – PPO | Admitting: Family Medicine

## 2019-12-11 ENCOUNTER — Encounter: Payer: Self-pay | Admitting: Family Medicine

## 2019-12-11 ENCOUNTER — Other Ambulatory Visit: Payer: Self-pay

## 2019-12-11 DIAGNOSIS — M545 Low back pain: Secondary | ICD-10-CM | POA: Diagnosis not present

## 2019-12-11 DIAGNOSIS — J302 Other seasonal allergic rhinitis: Secondary | ICD-10-CM

## 2019-12-11 DIAGNOSIS — E538 Deficiency of other specified B group vitamins: Secondary | ICD-10-CM

## 2019-12-11 DIAGNOSIS — F411 Generalized anxiety disorder: Secondary | ICD-10-CM | POA: Diagnosis not present

## 2019-12-11 DIAGNOSIS — F901 Attention-deficit hyperactivity disorder, predominantly hyperactive type: Secondary | ICD-10-CM

## 2019-12-11 DIAGNOSIS — J3089 Other allergic rhinitis: Secondary | ICD-10-CM | POA: Diagnosis not present

## 2019-12-11 DIAGNOSIS — G8929 Other chronic pain: Secondary | ICD-10-CM

## 2019-12-11 DIAGNOSIS — F410 Panic disorder [episodic paroxysmal anxiety] without agoraphobia: Secondary | ICD-10-CM

## 2019-12-11 MED ORDER — CYCLOBENZAPRINE HCL 10 MG PO TABS: 5.0000 mg | ORAL_TABLET | Freq: Every day | ORAL | 0 refills | Status: DC

## 2019-12-11 MED ORDER — AMPHETAMINE-DEXTROAMPHETAMINE 15 MG PO TABS: 15.0000 mg | ORAL_TABLET | Freq: Every day | ORAL | 0 refills | Status: DC

## 2019-12-11 MED ORDER — AMPHETAMINE-DEXTROAMPHET ER 25 MG PO CP24: 25.0000 mg | ORAL_CAPSULE | ORAL | 0 refills | Status: DC

## 2019-12-11 MED ORDER — ALPRAZOLAM 0.5 MG PO TABS: 0.5000 mg | ORAL_TABLET | ORAL | 0 refills | Status: DC | PRN

## 2019-12-11 MED ORDER — AMPHETAMINE-DEXTROAMPHETAMINE 15 MG PO TABS: 1.0000 | ORAL_TABLET | Freq: Every day | ORAL | 0 refills | Status: DC

## 2019-12-11 NOTE — Progress Notes (Signed)
Name: William Rivers   MRN: [id number]    DOB: 1991/05/30   Date:12/11/2019       Progress Note  Subjective  Chief Complaint  Chief Complaint  Patient presents with  . Follow-up    I connected with  [name] on 12/11/19 at  7:40 AM EST by telephone and verified that I am speaking with the correct person using two identifiers.  I discussed the limitations, risks, security and privacy concerns of performing an evaluation and management service by telephone and the availability of in person appointments. Staff also discussed with the patient that there may be a patient responsible charge related to this service. Patient Location: at work  Provider Location: [employer]   HPI ADHD: Finlay works long hours, aboutup to12hours per day , takes Adderal XR in am and a Adderal 15mg  around 2pm, it helps him stay focused and also helps him with anxiety, stops his mind from worrying His weight has been stable.He is stillworking for his father -in-law  . He needs refills today   Anemia/B12 deficiency: last CBC was normal, not feeling as tired, hand tingling resolved , taking supplementation daily   Hand stiffness: he works as an Transport planner, he has noticed inability to feel a lot of pain on his hands, no tingling or numbness, but feels stiff and swollen on his hands all day long. He states he has been taking Aleve every other day to control symptoms, he has intermittent redness but no increase in warmth Explained symptoms seems to be more of osteoarthritis. He is taking Advil in the morning, advised to change to Tylenol and monitor  Chronic low back pain: he wakes up feeling very stiff in am's, he is taking Flexeril most days of the week now. 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 He states not doing as bad lately, he still has 5 pills left   AR: he states he is doing well at this time, no nasal congestion or rhinorrhea.    Patient Active Problem List   Diagnosis Date Noted  . ADD (attention deficit disorder) 05/29/2015  . Chronic LBP 05/29/2015  . Lactose intolerance 05/29/2015  . Panic attack 05/29/2015  . Perennial allergic rhinitis with seasonal variation 05/29/2015  . Lumbosacral radiculitis 05/29/2015    Past Surgical History:  Procedure Laterality Date  . TONSILLECTOMY    . VASECTOMY Bilateral 02/21/2018    Family History  Problem Relation Age of Onset  . Anxiety disorder Mother      Current Outpatient Medications:  .  ALPRAZolam (XANAX) 0.5 MG tablet, Take 1 tablet (0.5 mg total) by mouth as needed for anxiety., Disp: 35 tablet, Rfl: 0 .  amphetamine-dextroamphetamine (ADDERALL XR) 25 MG 24 hr capsule, Take 1 capsule by mouth every morning., Disp: 30 capsule, Rfl: 0 .  amphetamine-dextroamphetamine (ADDERALL XR) 25 MG 24 hr capsule, Take 1 capsule by mouth every morning., Disp: 30 capsule, Rfl: 0 .  amphetamine-dextroamphetamine (ADDERALL XR) 25 MG 24 hr capsule, Take 1 capsule by mouth every morning., Disp: 30 capsule, Rfl: 0 .  amphetamine-dextroamphetamine (ADDERALL) 15 MG tablet, Take 1  tablet by mouth daily. At [place] working long hours, Disp: 30 tablet, Rfl: 0 .  amphetamine-dextroamphetamine (ADDERALL) 15 MG tablet, Take 1 tablet by mouth daily., Disp: 30 tablet, Rfl: 0 .  amphetamine-dextroamphetamine (ADDERALL) 15 MG tablet, Take 1 tablet by mouth daily., Disp: 30 tablet, Rfl: 0 .  azelastine (ASTELIN) 0.1 % nasal spray, Place 1 spray into both nostrils 2 (two) times daily. Use in each nostril as directed, Disp: 30 mL,  Rfl: 2 .  Cyanocobalamin (B-12) 1000 MCG SUBL, Place 1 tablet under the tongue daily., Disp: 100 tablet, Rfl: 1 .  cyclobenzaprine (FLEXERIL) 10 MG tablet, Take 0.5-1 tablets (5-10 mg total) by mouth at bedtime., Disp: 90 tablet, Rfl: 0  No Known Allergies  I personally reviewed active problem list, medication list, allergies, family history, social history with the patient/caregiver today.   ROS  Ten systems reviewed and is negative except as mentioned in HPI   Objective  Virtual encounter, vitals not obtained.  There is no height or weight on file to calculate BMI.  Physical Exam  Awake, alert and oriented  PHQ2/9: Depression screen [facility] 2/9 12/11/2019 09/22/2019 06/16/2019 03/26/2019 01/15/2019  Decreased Interest 0 0 0 0 0  Down, Depressed, Hopeless 0 0 0 0 0  PHQ - 2 Score 0 0 0 0 0  Altered sleeping 0 0 0 0 -  Tired, decreased energy 0 0 0 0 -  Change in appetite 0 0 0 0 -  Feeling bad or failure about yourself  0 0 0 0 -  Trouble concentrating 0 0 0 3 -  Moving slowly or fidgety/restless 0 0 0 0 -  Suicidal thoughts 0 0 0 0 -  PHQ-9 Score 0 0 0 3 -  Difficult doing work/chores Not difficult at all Not difficult at all Not difficult at all Not difficult at all -   PHQ-2/9 Result is negative.    Fall Risk: Fall Risk  09/22/2019 06/16/2019 03/26/2019 01/15/2019 12/04/2018  Falls in the past year? 0 0 0 0 0  Number falls in past yr: 0 0 0 0 0  Injury with Fall? 0 0 0 0 0      Assessment & Plan  1. Attention deficit hyperactivity disorder (ADHD), predominantly hyperactive type  - amphetamine-dextroamphetamine (ADDERALL) 15 MG tablet; Take 1 tablet by mouth daily. At [place] working long hours  Dispense: 30 tablet; Refill: 0 - amphetamine-dextroamphetamine (ADDERALL XR) 25 MG 24 hr capsule; Take 1 capsule by mouth every morning.  Dispense: 30 capsule; Refill: 0 - amphetamine-dextroamphetamine (ADDERALL XR) 25 MG 24 hr capsule; Take 1 capsule by mouth every morning.   Dispense: 30 capsule; Refill: 0 - amphetamine-dextroamphetamine (ADDERALL XR) 25 MG 24 hr capsule; Take 1 capsule by mouth every morning.  Dispense: 30 capsule; Refill: 0 - amphetamine-dextroamphetamine (ADDERALL) 15 MG tablet; Take 1 tablet by mouth daily.  Dispense: 30 tablet; Refill: 0 - amphetamine-dextroamphetamine (ADDERALL) 15 MG tablet; Take 1 tablet by mouth daily.  Dispense: 30 tablet; Refill: 0  2. Perennial allergic rhinitis with seasonal variation  Doing well at this time  3. GAD (generalized anxiety disorder)  stable  4. Chronic bilateral low back pain without sciatica  - cyclobenzaprine (FLEXERIL) 10 MG tablet; Take 0.5-1 tablets (5-10 mg total) by mouth at bedtime.  Dispense: 90 tablet; Refill: 0  5. B12 deficiency  Continue supplementation   6. Panic attack  - ALPRAZolam (XANAX) 0.5 MG tablet; Take 1 tablet (0.5 mg total) by mouth as needed for anxiety.  Dispense: 35 tablet; Refill: 0  I discussed the assessment and treatment plan with the patient. The patient was provided an opportunity to ask questions and all were answered. The patient agreed with the plan and demonstrated an understanding of the instructions.   The patient was advised to call back or seek an in-person evaluation if the symptoms worsen or if the condition fails to improve as anticipated.  I provided 25 minutes of non-face-to-face time during this encounter.  Ruel Favors, MD

## 2020-03-04 ENCOUNTER — Encounter: Payer: Self-pay | Admitting: Family Medicine

## 2020-03-04 ENCOUNTER — Ambulatory Visit (INDEPENDENT_AMBULATORY_CARE_PROVIDER_SITE_OTHER): Payer: BC Managed Care – PPO | Admitting: Family Medicine

## 2020-03-04 ENCOUNTER — Other Ambulatory Visit: Payer: Self-pay

## 2020-03-04 DIAGNOSIS — F901 Attention-deficit hyperactivity disorder, predominantly hyperactive type: Secondary | ICD-10-CM | POA: Diagnosis not present

## 2020-03-04 DIAGNOSIS — J3089 Other allergic rhinitis: Secondary | ICD-10-CM | POA: Diagnosis not present

## 2020-03-04 DIAGNOSIS — J302 Other seasonal allergic rhinitis: Secondary | ICD-10-CM

## 2020-03-04 DIAGNOSIS — G8929 Other chronic pain: Secondary | ICD-10-CM

## 2020-03-04 DIAGNOSIS — M545 Low back pain: Secondary | ICD-10-CM | POA: Diagnosis not present

## 2020-03-04 DIAGNOSIS — F410 Panic disorder [episodic paroxysmal anxiety] without agoraphobia: Secondary | ICD-10-CM

## 2020-03-04 MED ORDER — AMPHETAMINE-DEXTROAMPHETAMINE 15 MG PO TABS: 15.0000 mg | ORAL_TABLET | Freq: Every day | ORAL | 0 refills | Status: DC

## 2020-03-04 MED ORDER — AMPHETAMINE-DEXTROAMPHET ER 25 MG PO CP24: 25.0000 mg | ORAL_CAPSULE | ORAL | 0 refills | Status: DC

## 2020-03-04 MED ORDER — AMPHETAMINE-DEXTROAMPHETAMINE 15 MG PO TABS: 1.0000 | ORAL_TABLET | Freq: Every day | ORAL | 0 refills | Status: DC

## 2020-03-04 MED ORDER — AZELASTINE HCL 0.1 % NA SOLN: 1.0000 | Freq: Two times a day (BID) | NASAL | 2 refills | Status: DC

## 2020-03-04 MED ORDER — CYCLOBENZAPRINE HCL 10 MG PO TABS: 5.0000 mg | ORAL_TABLET | Freq: Every day | ORAL | 0 refills | Status: DC

## 2020-03-04 MED ORDER — ALPRAZOLAM 0.5 MG PO TABS: 0.5000 mg | ORAL_TABLET | ORAL | 0 refills | Status: DC | PRN

## 2020-03-04 MED ORDER — LEVOCETIRIZINE DIHYDROCHLORIDE 5 MG PO TABS: 5.0000 mg | ORAL_TABLET | Freq: Every evening | ORAL | 1 refills | Status: DC

## 2020-03-04 NOTE — Progress Notes (Signed)
Name: William Rivers   MRN: [id number]    DOB: 06/30/1991   Date:03/04/2020       Progress Note  Subjective  Chief Complaint  Chief Complaint  Patient presents with  . ADHD    medication refills, follow up  . Anxiety    I connected with  William Rivers on 03/04/20 at  7:40 AM EDT by telephone and verified that I am speaking with the correct person using two identifiers.  I discussed the limitations, risks, security and privacy concerns of performing an evaluation and management service by telephone and the availability of in person appointments. Staff also discussed with the patient that there may be a patient responsible charge related to this service. Patient Location: at work  Provider Location: [facility]   HPI  ADHD: William Rivers works long hours, aboutup to12hours per day sometimes on weekends also, takes Adderal XR in am and a Adderal 15mg  around 2pm, it helps him stay focused and also helps him with anxiety, stops his mind from worrying He states recently stopped drinking caffeine through sweet tea, sodas and coffee because it was increasing his anxiety and he may have lost some weight over the past couple of weeks .He is stillworkingfor his father - . Medication does not seem to affect his sleep   Anemia/B12 deficiency:last CBCwas normal, not feeling as tired, hand tingling resolved , he stopped taking B12 about one month ago, advised to resume medication even though symptoms did not recurr  Hand stiffness: he works as an English as a second language teacher, he has noticed inability to feel a lot of pain on his hands, no tingling or numbness, but feels stiff and swollen on his hands all day long. He states he has been taking Aleve every other day to control symptoms, he has intermittent redness but no increase in warmth Explained symptoms seems to be more of osteoarthritis. He is cutting down on taking medication daily, taking Advil a few times a week instead of daily    Chronic low back pain: he wakes up feeling very stiff in am's, he istaking Flexeril about 5 days a week .Works as an Personnel officer.He states usually aching on lower back, but sometimes shoots down right leg, episodes of radiculitis is sporadic.Aggravated by sitting for a prolonged period of time.Pain at this time is 3/10   Anxiety: taking Alprazolam prn, currently usually a half prn at night, no recent episodes of panic attacks, 35 pills lasting 90 days, he states it comes in spells, random episodes , he states he is almost out of medication, he was feeling very anxious but since he cut down on caffeine intake he is back to baseline. He has two pills left at home  AR: he is having some symptoms now, he has nasal congestion, no rhinorrhea. He would like a refill of Astelin   Social History   Tobacco Use  . Smoking status: Never Smoker  . Smokeless tobacco: Current User    Types: Chew  Substance Use Topics  . Alcohol use: Yes    Alcohol/week: 18.0 standard drinks    Types: 6 Cans of beer, 12 Standard drinks or equivalent per week    Patient Active Problem List   Diagnosis Date Noted  . ADD (attention deficit disorder) 05/29/2015  . Chronic LBP 05/29/2015  . Lactose intolerance 05/29/2015  . Panic attack 05/29/2015  . Perennial allergic rhinitis with seasonal variation 05/29/2015  . Lumbosacral radiculitis 05/29/2015    Past Surgical History:  Procedure Laterality Date  . TONSILLECTOMY    .  VASECTOMY Bilateral 02/21/2018    Family History  Problem Relation Age of Onset  . Anxiety disorder Mother        Current Outpatient Medications:  .  ALPRAZolam (XANAX) 0.5 MG tablet, Take 1 tablet (0.5 mg total) by mouth as needed for anxiety., Disp: 35 tablet, Rfl: 0 .  amphetamine-dextroamphetamine (ADDERALL XR) 25 MG 24 hr capsule, Take 1 capsule by mouth every morning., Disp: 30 capsule, Rfl: 0 .  amphetamine-dextroamphetamine (ADDERALL XR) 25 MG 24 hr capsule, Take 1  capsule by mouth every morning., Disp: 30 capsule, Rfl: 0 .  amphetamine-dextroamphetamine (ADDERALL XR) 25 MG 24 hr capsule, Take 1 capsule by mouth every morning., Disp: 30 capsule, Rfl: 0 .  amphetamine-dextroamphetamine (ADDERALL) 15 MG tablet, Take 1 tablet by mouth daily. At [place] working long hours, Disp: 30 tablet, Rfl: 0 .  amphetamine-dextroamphetamine (ADDERALL) 15 MG tablet, Take 1 tablet by mouth daily., Disp: 30 tablet, Rfl: 0 .  amphetamine-dextroamphetamine (ADDERALL) 15 MG tablet, Take 1 tablet by mouth daily., Disp: 30 tablet, Rfl: 0 .  azelastine (ASTELIN) 0.1 % nasal spray, Place 1 spray into both nostrils 2 (two) times daily. Use in each nostril as directed, Disp: 30 mL, Rfl: 2 .  Cyanocobalamin (B-12) 1000 MCG SUBL, Place 1 tablet under the tongue daily., Disp: 100 tablet, Rfl: 1 .  cyclobenzaprine (FLEXERIL) 10 MG tablet, Take 0.5-1 tablets (5-10 mg total) by mouth at bedtime., Disp: 90 tablet, Rfl: 0  No Known Allergies  I personally reviewed active problem list, medication list, allergies, family history, social history with the patient/caregiver today.   ROS  Ten systems reviewed and is negative except as mentioned in HPI   Objective  Virtual encounter, vitals not obtained.  There is no height or weight on file to calculate BMI.  Physical Exam  Awake, alert and oriented   PHQ2/9: Depression screen [facility] 2/9 03/04/2020 12/11/2019 09/22/2019 06/16/2019 03/26/2019  Decreased Interest 0 0 0 0 0  Down, Depressed, Hopeless 0 0 0 0 0  PHQ - 2 Score 0 0 0 0 0  Altered sleeping 0 0 0 0 0  Tired, decreased energy 0 0 0 0 0  Change in appetite 0 0 0 0 0  Feeling bad or failure about yourself  0 0 0 0 0  Trouble concentrating 0 0 0 0 3  Moving slowly or fidgety/restless 0 0 0 0 0  Suicidal thoughts 0 0 0 0 0  PHQ-9 Score 0 0 0 0 3  Difficult doing work/chores Not difficult at all Not difficult at all Not difficult at all Not difficult at all Not difficult at all   Some recent data might be hidden   PHQ-2/9 Result is negative.    Fall Risk: Fall Risk  03/04/2020 09/22/2019 06/16/2019 03/26/2019 01/15/2019  Falls in the past year? 0 0 0 0 0  Number falls in past yr: 0 0 0 0 0  Injury with Fall? 0 0 0 0 0  Follow up Falls evaluation completed - - - -     Assessment & Plan  1. Attention deficit hyperactivity disorder (ADHD), predominantly hyperactive type  - amphetamine-dextroamphetamine (ADDERALL) 15 MG tablet; Take 1 tablet by mouth daily. At [place] working long hours  Dispense: 30 tablet; Refill: 0 - amphetamine-dextroamphetamine (ADDERALL XR) 25 MG 24 hr capsule; Take 1 capsule by mouth every morning.  Dispense: 30 capsule; Refill: 0 - amphetamine-dextroamphetamine (ADDERALL XR) 25 MG 24 hr capsule; Take 1 capsule by mouth every morning.  Dispense: 30 capsule; Refill: 0 - amphetamine-dextroamphetamine (ADDERALL XR) 25 MG 24 hr capsule; Take 1 capsule by mouth every morning.  Dispense: 30 capsule; Refill: 0 - amphetamine-dextroamphetamine (ADDERALL) 15 MG tablet; Take 1 tablet by mouth daily.  Dispense: 30 tablet; Refill: 0 - amphetamine-dextroamphetamine (ADDERALL) 15 MG tablet; Take 1 tablet by mouth daily.  Dispense: 30 tablet; Refill: 0  2. Chronic bilateral low back pain without sciatica  - cyclobenzaprine (FLEXERIL) 10 MG tablet; Take 0.5-1 tablets (5-10 mg total) by mouth at bedtime.  Dispense: 90 tablet; Refill: 0  3. Panic attack  - ALPRAZolam (XANAX) 0.5 MG tablet; Take 1 tablet (0.5 mg total) by mouth as needed for anxiety.  Dispense: 35 tablet; Refill: 0  4. Perennial allergic rhinitis with seasonal variation  - azelastine (ASTELIN) 0.1 % nasal spray; Place 1 spray into both nostrils 2 (two) times daily. Use in each nostril as directed  Dispense: 30 mL; Refill: 2 - levocetirizine (XYZAL) 5 MG tablet; Take 1 tablet (5 mg total) by mouth every evening.  Dispense: 90 tablet; Refill: 1  I discussed the assessment and treatment plan  with the patient. The patient was provided an opportunity to ask questions and all were answered. The patient agreed with the plan and demonstrated an understanding of the instructions.   The patient was advised to call back or seek an in-person evaluation if the symptoms worsen or if the condition fails to improve as anticipated.  I provided 25  minutes of non-face-to-face time during this encounter.  Ruel Favors, MD

## 2020-06-14 ENCOUNTER — Other Ambulatory Visit: Payer: Self-pay

## 2020-06-14 ENCOUNTER — Ambulatory Visit: Payer: BC Managed Care – PPO | Admitting: Family Medicine

## 2020-06-14 ENCOUNTER — Encounter: Payer: Self-pay | Admitting: Family Medicine

## 2020-06-14 VITALS — BP 124/70 | HR 91 | Temp 97.5°F | Resp 16 | Ht 72.0 in | Wt 198.6 lb

## 2020-06-14 DIAGNOSIS — M545 Low back pain, unspecified: Secondary | ICD-10-CM

## 2020-06-14 DIAGNOSIS — J302 Other seasonal allergic rhinitis: Secondary | ICD-10-CM

## 2020-06-14 DIAGNOSIS — G8929 Other chronic pain: Secondary | ICD-10-CM

## 2020-06-14 DIAGNOSIS — E538 Deficiency of other specified B group vitamins: Secondary | ICD-10-CM | POA: Diagnosis not present

## 2020-06-14 DIAGNOSIS — J3089 Other allergic rhinitis: Secondary | ICD-10-CM

## 2020-06-14 DIAGNOSIS — F410 Panic disorder [episodic paroxysmal anxiety] without agoraphobia: Secondary | ICD-10-CM

## 2020-06-14 DIAGNOSIS — F901 Attention-deficit hyperactivity disorder, predominantly hyperactive type: Secondary | ICD-10-CM

## 2020-06-14 DIAGNOSIS — R0602 Shortness of breath: Secondary | ICD-10-CM

## 2020-06-14 DIAGNOSIS — F411 Generalized anxiety disorder: Secondary | ICD-10-CM

## 2020-06-14 DIAGNOSIS — M546 Pain in thoracic spine: Secondary | ICD-10-CM

## 2020-06-14 DIAGNOSIS — I83813 Varicose veins of bilateral lower extremities with pain: Secondary | ICD-10-CM

## 2020-06-14 MED ORDER — AMPHETAMINE-DEXTROAMPHET ER 25 MG PO CP24: 25.0000 mg | ORAL_CAPSULE | ORAL | 0 refills | Status: DC

## 2020-06-14 MED ORDER — MELOXICAM 15 MG PO TABS: 15.0000 mg | ORAL_TABLET | Freq: Every day | ORAL | 0 refills | Status: DC

## 2020-06-14 MED ORDER — AMPHETAMINE-DEXTROAMPHETAMINE 15 MG PO TABS: 1.0000 | ORAL_TABLET | Freq: Every day | ORAL | 0 refills | Status: DC

## 2020-06-14 MED ORDER — ALPRAZOLAM 0.5 MG PO TABS: 0.5000 mg | ORAL_TABLET | ORAL | 0 refills | Status: DC | PRN

## 2020-06-14 MED ORDER — ESCITALOPRAM OXALATE 5 MG PO TABS: 5.0000 mg | ORAL_TABLET | Freq: Every day | ORAL | 0 refills | Status: DC

## 2020-06-14 MED ORDER — AMPHETAMINE-DEXTROAMPHETAMINE 15 MG PO TABS: 15.0000 mg | ORAL_TABLET | Freq: Every day | ORAL | 0 refills | Status: DC

## 2020-06-14 NOTE — Progress Notes (Signed)
Name: William Rivers   MRN: [id number]    DOB: [birth year]   Date:06/14/2020       Progress Note  Subjective  Chief Complaint  Chief Complaint  Patient presents with  . ADHD  . Shortness of Breath    He reports that he has been having some sob x 2 months  . Shoulder Pain    He has been having some pressure and tingling in the center of his shoulders x 2 months that is constant.  . Varicose Veins    He has varicose veins that come out in the evening. He states that they are constantly painful, a lot of pressure.    HPI  SOB: he states he used inhalers prn as a child. He states has noticed SOB that is getting progressively worse over the past 2 months. Seems to be worse on weekends when off work and when not very busy, like when driving. He has a history of anxiety, and seems to feel nervous when the SOB occurs   ADHD: he is taking medication as prescribed, denies side effects. He needs a refill of medications today, he denies misusing medication   Palpitation: resolved when he cut down on caffeine, down to two sodas a day and one cup of coffee   Anxiety: he is working more again, 12 hours per day , he is feeling more stressed. 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 He is trying to get money to build a house soon. He only has palpitation occasionally now that he decreased caffeine intake   Varicose veins: brother and grandfather have same problems. He states he has noticed leg heaviness and the bulging is worse at the end of the day. He denies lower extremity edema. Advised compression stocking hoses   Upper back pain: he has pain between shoulder blades, described as constant for the past 2 months, around the same time he developed SOB. No wheezing or cough. He denies an URI prior to symptoms starting. Pain is described as sharp or pressure, sometimes tingling sensation . He took a muscle relaxer last night but did not help    Patient Active Problem List    Diagnosis Date Noted  . ADD (attention deficit disorder) 05/29/2015  . Chronic LBP 05/29/2015  . Lactose intolerance 05/29/2015  . Panic attack 05/29/2015  . Perennial allergic rhinitis with seasonal variation 05/29/2015  . Lumbosacral radiculitis 05/29/2015    Past Surgical History:  Procedure Laterality Date  . TONSILLECTOMY    . VASECTOMY Bilateral 02/21/2018    Family History  Problem Relation Age of Onset  . Anxiety disorder Mother     Social History   Tobacco Use  . Smoking status: Never Smoker  . Smokeless tobacco: Current User    Types: Chew  Substance Use Topics  . Alcohol use: Yes    Alcohol/week: 18.0 standard drinks    Types: 6 Cans of beer, 12 Standard drinks or equivalent per week     Current Outpatient Medications:  .  ALPRAZolam (XANAX) 0.5 MG tablet, Take 1 tablet (0.5 mg total) by mouth as needed for anxiety., Disp: 35 tablet, Rfl: 0 .  amphetamine-dextroamphetamine (ADDERALL XR) 25 MG 24 hr capsule, Take 1 capsule by mouth every morning., Disp: 30 capsule, Rfl: 0 .  amphetamine-dextroamphetamine (ADDERALL XR) 25 MG 24 hr capsule, Take 1 capsule by mouth every morning., Disp: 30 capsule, Rfl: 0 .  amphetamine-dextroamphetamine (ADDERALL XR) 25 MG 24 hr capsule,  Take 1 capsule by mouth every morning., Disp: 30 capsule, Rfl: 0 .  amphetamine-dextroamphetamine (ADDERALL) 15 MG tablet, Take 1 tablet by mouth daily. At lunch-when working long hours, Disp: 30 tablet, Rfl: 0 .  amphetamine-dextroamphetamine (ADDERALL) 15 MG tablet, Take 1 tablet by mouth daily., Disp: 30 tablet, Rfl: 0 .  amphetamine-dextroamphetamine (ADDERALL) 15 MG tablet, Take 1 tablet by mouth daily., Disp: 30 tablet, Rfl: 0 .  azelastine (ASTELIN) 0.1 % nasal spray, Place 1 spray into both nostrils 2 (two) times daily. Use in each nostril as directed, Disp: 30 mL, Rfl: 2 .  Cyanocobalamin (B-12) 1000 MCG SUBL, Place 1 tablet under the tongue daily., Disp: 100 tablet, Rfl: 1 .   cyclobenzaprine (FLEXERIL) 10 MG tablet, Take 0.5-1 tablets (5-10 mg total) by mouth at bedtime., Disp: 90 tablet, Rfl: 0 .  levocetirizine (XYZAL) 5 MG tablet, Take 1 tablet (5 mg total) by mouth every evening., Disp: 90 tablet, Rfl: 1  No Known Allergies  I personally reviewed active problem list, medication list, allergies, family history, social history, health maintenance with the patient/caregiver today.   ROS  Constitutional: Negative for fever or weight change.  Respiratory: Negative for cough , positive for shortness of breath.   Cardiovascular: Negative for chest pain or palpitations.  Gastrointestinal: Negative for abdominal pain, no bowel changes.  Musculoskeletal: Negative for gait problem or joint swelling.  Skin: Negative for rash.  Neurological: Negative for dizziness or headache.  No other specific complaints in a complete review of systems (except as listed in HPI above).  Objective  Vitals:   06/14/20 0924  BP: 124/70  Pulse: 91  Resp: 16  Temp: (!) 97.5 F (36.4 C)  TempSrc: Temporal  SpO2: 98%  Weight: 198 lb 9.6 oz (90.1 kg)  Height: 6' (1.829 m)    Body mass index is 26.94 kg/m.  Physical Exam  Constitutional: Patient appears well-developed and well-nourished. Overweight.  No distress.  HEENT: head atraumatic, normocephalic, pupils equal and reactive to light,  neck supple Cardiovascular: Normal rate, regular rhythm and normal heart sounds.  No murmur heard. No BLE edema. Small varicose veins Pulmonary/Chest: Effort normal and breath sounds normal. No respiratory distress. Abdominal: Soft.  There is no tenderness. Psychiatric: Patient has a normal mood and affect. behavior is normal. Judgment and thought content normal.  PHQ2/9: Depression screen [facility] 2/9 06/14/2020 03/04/2020 12/11/2019 09/22/2019 06/16/2019  Decreased Interest 0 0 0 0 0  Down, Depressed, Hopeless 0 0 0 0 0  PHQ - 2 Score 0 0 0 0 0  Altered sleeping 0 0 0 0 0  Tired, decreased  energy 0 0 0 0 0  Change in appetite 0 0 0 0 0  Feeling bad or failure about yourself  0 0 0 0 0  Trouble concentrating 0 0 0 0 0  Moving slowly or fidgety/restless 0 0 0 0 0  Suicidal thoughts 0 0 0 0 0  PHQ-9 Score 0 0 0 0 0  Difficult doing work/chores - Not difficult at all Not difficult at all Not difficult at all Not difficult at all  Some recent data might be hidden    phq 9 is negative   Fall Risk: Fall Risk  06/14/2020 03/04/2020 09/22/2019 06/16/2019 03/26/2019  Falls in the past year? 0 0 0 0 0  Number falls in past yr: 0 0 0 0 0  Injury with Fall? 0 0 0 0 0  Follow up - Falls evaluation completed - - -  Functional Status Survey: Is the patient deaf or have difficulty hearing?: No Does the patient have difficulty seeing, even when wearing glasses/contacts?: No Does the patient have difficulty concentrating, remembering, or making decisions?: No Does the patient have difficulty walking or climbing stairs?: No Does the patient have difficulty dressing or bathing?: No Does the patient have difficulty doing errands alone such as visiting a doctor's office or shopping?: No    Assessment & Plan  1. Attention deficit hyperactivity disorder (ADHD), predominantly hyperactive type  - amphetamine-dextroamphetamine (ADDERALL XR) 25 MG 24 hr capsule; Take 1 capsule by mouth every morning.  Dispense: 30 capsule; Refill: 0 - amphetamine-dextroamphetamine (ADDERALL XR) 25 MG 24 hr capsule; Take 1 capsule by mouth every morning.  Dispense: 30 capsule; Refill: 0 - amphetamine-dextroamphetamine (ADDERALL XR) 25 MG 24 hr capsule; Take 1 capsule by mouth every morning.  Dispense: 30 capsule; Refill: 0 - amphetamine-dextroamphetamine (ADDERALL) 15 MG tablet; Take 1 tablet by mouth daily. At lunch-when working long hours  Dispense: 30 tablet; Refill: 0 - amphetamine-dextroamphetamine (ADDERALL) 15 MG tablet; Take 1 tablet by mouth daily.  Dispense: 30 tablet; Refill: 0 -  amphetamine-dextroamphetamine (ADDERALL) 15 MG tablet; Take 1 tablet by mouth daily.  Dispense: 30 tablet; Refill: 0  2. Chronic bilateral low back pain without sciatica   3. Panic attack  - ALPRAZolam (XANAX) 0.5 MG tablet; Take 1 tablet (0.5 mg total) by mouth as needed for anxiety.  Dispense: 35 tablet; Refill: 0 - escitalopram (LEXAPRO) 5 MG tablet; Take 1 tablet (5 mg total) by mouth daily.  Dispense: 90 tablet; Refill: 0  4. B12 deficiency  Continue medication otc   5. Perennial allergic rhinitis with seasonal variation   6. GAD (generalized anxiety disorder)  - escitalopram (LEXAPRO) 5 MG tablet; Take 1 tablet (5 mg total) by mouth daily.  Dispense: 90 tablet; Refill: 0  7. SOB (shortness of breath)  We will monitor for now, likely related to anxiety   8. Varicose veins of both lower extremities with pain  Advised compression stocking hoses   9. Midline thoracic back pain, unspecified chronicity  - meloxicam (MOBIC) 15 MG tablet; Take 1 tablet (15 mg total) by mouth daily.  Dispense: 30 tablet; Refill: 0

## 2020-09-01 NOTE — Progress Notes (Signed)
Name: William Rivers   MRN: [id number]    DOB: 06-Jun-1991   Date:09/02/2020       Progress Note  Subjective  Chief Complaint  Follow up   HPI    ADHD: he is taking medication as prescribed, denies side effects. He needs a refill of medications today, he denies misusing medication and is compliant with follow ups   Palpitation: resolved when he cut down on caffeine, he states currently not even drinking coffee, drinks at most one soda a day   Anxiety: he is working more again, 12 hours per day . 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 He states he sob improved when anxiety improved. He states he has been taking half of a xanax every other day to help with stress, usually at night. No panic attacks, and feeling better He states the sob and palpitation usually with anxiety, states resolves with alprazolam   Weight loss: he stopped eating as often, packing snacks, eats dinner at home, drinking water, lost 9 lbs and is feeling better  Varicose veins: brother and grandfather have same problems. He states he has noticed leg heaviness and the bulging is worse at the end of the day. He denies lower extremity edema. He has been wearing compression stocking hoses a couple times a week and it seems to help   Chronic low back pain: usually at the end of the day, feels more stiff than painful, he states stable. Used to have radiculitis but not recently    Patient Active Problem List   Diagnosis Date Noted  . ADD (attention deficit disorder) 05/29/2015  . Chronic LBP 05/29/2015  . Lactose intolerance 05/29/2015  . Panic attack 05/29/2015  . Perennial allergic rhinitis with seasonal variation 05/29/2015  . Lumbosacral radiculitis 05/29/2015    Past Surgical History:  Procedure Laterality Date  . TONSILLECTOMY    . VASECTOMY Bilateral 02/21/2018    Family History  Problem Relation Age of Onset  . Anxiety disorder Mother     Social History   Tobacco Use   . Smoking status: Never Smoker  . Smokeless tobacco: Current User    Types: Chew  Substance Use Topics  . Alcohol use: Yes    Alcohol/week: 18.0 standard drinks    Types: 6 Cans of beer, 12 Standard drinks or equivalent per week     Current Outpatient Medications:  .  ALPRAZolam (XANAX) 0.5 MG tablet, Take 1 tablet (0.5 mg total) by mouth as needed for anxiety., Disp: 35 tablet, Rfl: 0 .  amphetamine-dextroamphetamine (ADDERALL XR) 25 MG 24 hr capsule, Take 1 capsule by mouth every morning., Disp: 30 capsule, Rfl: 0 .  amphetamine-dextroamphetamine (ADDERALL XR) 25 MG 24 hr capsule, Take 1 capsule by mouth every morning., Disp: 30 capsule, Rfl: 0 .  amphetamine-dextroamphetamine (ADDERALL XR) 25 MG 24 hr capsule, Take 1 capsule by mouth every morning., Disp: 30 capsule, Rfl: 0 .  amphetamine-dextroamphetamine (ADDERALL) 15 MG tablet, Take 1 tablet by mouth daily. At lunch-when working long hours, Disp: 30 tablet, Rfl: 0 .  amphetamine-dextroamphetamine (ADDERALL) 15 MG tablet, Take 1 tablet by mouth daily., Disp: 30 tablet, Rfl: 0 .  amphetamine-dextroamphetamine (ADDERALL) 15 MG tablet, Take 1 tablet by mouth daily., Disp: 30 tablet, Rfl: 0 .  Cyanocobalamin (B-12) 1000 MCG SUBL, Place 1 tablet under the tongue daily., Disp: 100 tablet, Rfl: 1 .  cyclobenzaprine (FLEXERIL) 10 MG tablet, Take 0.5-1 tablets (5-10 mg total) by mouth at bedtime.,  Disp: 90 tablet, Rfl: 0 .  azelastine (ASTELIN) 0.1 % nasal spray, Place 1 spray into both nostrils 2 (two) times daily. Use in each nostril as directed (Patient not taking: Reported on 09/02/2020), Disp: 30 mL, Rfl: 2 .  escitalopram (LEXAPRO) 5 MG tablet, Take 1 tablet (5 mg total) by mouth daily. (Patient not taking: Reported on 09/02/2020), Disp: 90 tablet, Rfl: 0 .  levocetirizine (XYZAL) 5 MG tablet, Take 1 tablet (5 mg total) by mouth every evening. (Patient not taking: Reported on 09/02/2020), Disp: 90 tablet, Rfl: 1 .  meloxicam (MOBIC) 15 MG  tablet, Take 1 tablet (15 mg total) by mouth daily. (Patient not taking: Reported on 09/02/2020), Disp: 30 tablet, Rfl: 0  No Known Allergies  I personally reviewed active problem list, medication list, allergies, family history, social history, health maintenance with the patient/caregiver today.   ROS  Constitutional: Negative for fever or weight change.  Respiratory: Negative for cough and shortness of breath.   Cardiovascular: Negative for chest pain or palpitations.  Gastrointestinal: Negative for abdominal pain, no bowel changes.  Musculoskeletal: Negative for gait problem or joint swelling.  Skin: Negative for rash.  Neurological: Negative for dizziness or headache.  No other specific complaints in a complete review of systems (except as listed in HPI above).   Objective  Vitals:   09/02/20 1317  BP: 122/84  Pulse: 98  Resp: 18  Temp: 98.3 F (36.8 C)  TempSrc: Oral  SpO2: 99%  Weight: 191 lb 8 oz (86.9 kg)  Height: 6' (1.829 m)    Body mass index is 25.97 kg/m.  Physical Exam  Constitutional: Patient appears well-developed and well-nourished. No distress.  HEENT: head atraumatic, normocephalic, pupils equal and reactive to light, e neck supple Cardiovascular: Normal rate, regular rhythm and normal heart sounds.  No murmur heard. No BLE edema. Pulmonary/Chest: Effort normal and breath sounds normal. No respiratory distress. Abdominal: Soft.  There is no tenderness. Psychiatric: Patient has a normal mood and affect. behavior is normal. Judgment and thought content normal.  PHQ2/9: Depression screen [facility] 2/9 09/02/2020 06/14/2020 03/04/2020 12/11/2019 09/22/2019  Decreased Interest 0 0 0 0 0  Down, Depressed, Hopeless 0 0 0 0 0  PHQ - 2 Score 0 0 0 0 0  Altered sleeping - 0 0 0 0  Tired, decreased energy - 0 0 0 0  Change in appetite - 0 0 0 0  Feeling bad or failure about yourself  - 0 0 0 0  Trouble concentrating - 0 0 0 0  Moving slowly or fidgety/restless -  0 0 0 0  Suicidal thoughts - 0 0 0 0  PHQ-9 Score - 0 0 0 0  Difficult doing work/chores - - Not difficult at all Not difficult at all Not difficult at all  Some recent data might be hidden    phq 9 is negative   Fall Risk: Fall Risk  09/02/2020 06/14/2020 03/04/2020 09/22/2019 06/16/2019  Falls in the past year? 0 0 0 0 0  Number falls in past yr: 0 0 0 0 0  Injury with Fall? 0 0 0 0 0  Follow up Falls evaluation completed - Falls evaluation completed - -     Functional Status Survey: Is the patient deaf or have difficulty hearing?: No Does the patient have difficulty seeing, even when wearing glasses/contacts?: No Does the patient have difficulty concentrating, remembering, or making decisions?: No Does the patient have difficulty walking or climbing stairs?: No Does the patient  have difficulty dressing or bathing?: No Does the patient have difficulty doing errands alone such as visiting a doctor's office or shopping?: No    Assessment & Plan  1. Panic attack  - ALPRAZolam (XANAX) 0.5 MG tablet; Take 1 tablet (0.5 mg total) by mouth as needed for anxiety.  Dispense: 35 tablet; Refill: 0  2. Attention deficit hyperactivity disorder (ADHD), predominantly hyperactive type  - amphetamine-dextroamphetamine (ADDERALL XR) 25 MG 24 hr capsule; Take 1 capsule by mouth every morning.  Dispense: 30 capsule; Refill: 0 - amphetamine-dextroamphetamine (ADDERALL XR) 25 MG 24 hr capsule; Take 1 capsule by mouth every morning.  Dispense: 30 capsule; Refill: 0 - amphetamine-dextroamphetamine (ADDERALL XR) 25 MG 24 hr capsule; Take 1 capsule by mouth every morning.  Dispense: 30 capsule; Refill: 0 - amphetamine-dextroamphetamine (ADDERALL) 15 MG tablet; Take 1 tablet by mouth daily. At lunch-when working long hours  Dispense: 30 tablet; Refill: 0 - amphetamine-dextroamphetamine (ADDERALL) 15 MG tablet; Take 1 tablet by mouth daily.  Dispense: 30 tablet; Refill: 0 - amphetamine-dextroamphetamine  (ADDERALL) 15 MG tablet; Take 1 tablet by mouth daily.  Dispense: 30 tablet; Refill: 0  3. Chronic bilateral low back pain without sciatica  - cyclobenzaprine (FLEXERIL) 10 MG tablet; Take 0.5-1 tablets (5-10 mg total) by mouth at bedtime.  Dispense: 90 tablet; Refill: 0

## 2020-09-02 ENCOUNTER — Encounter: Payer: Self-pay | Admitting: Family Medicine

## 2020-09-02 ENCOUNTER — Other Ambulatory Visit: Payer: Self-pay

## 2020-09-02 ENCOUNTER — Ambulatory Visit: Payer: BC Managed Care – PPO | Admitting: Family Medicine

## 2020-09-02 DIAGNOSIS — G8929 Other chronic pain: Secondary | ICD-10-CM

## 2020-09-02 DIAGNOSIS — F410 Panic disorder [episodic paroxysmal anxiety] without agoraphobia: Secondary | ICD-10-CM

## 2020-09-02 DIAGNOSIS — M545 Low back pain, unspecified: Secondary | ICD-10-CM

## 2020-09-02 DIAGNOSIS — F901 Attention-deficit hyperactivity disorder, predominantly hyperactive type: Secondary | ICD-10-CM | POA: Diagnosis not present

## 2020-09-02 MED ORDER — ALPRAZOLAM 0.5 MG PO TABS: 0.5000 mg | ORAL_TABLET | ORAL | 0 refills | Status: DC | PRN

## 2020-09-02 MED ORDER — AMPHETAMINE-DEXTROAMPHET ER 25 MG PO CP24: 25.0000 mg | ORAL_CAPSULE | ORAL | 0 refills | Status: DC

## 2020-09-02 MED ORDER — AMPHETAMINE-DEXTROAMPHETAMINE 15 MG PO TABS: 1.0000 | ORAL_TABLET | Freq: Every day | ORAL | 0 refills | Status: DC

## 2020-09-02 MED ORDER — AMPHETAMINE-DEXTROAMPHETAMINE 15 MG PO TABS: 15.0000 mg | ORAL_TABLET | Freq: Every day | ORAL | 0 refills | Status: DC

## 2020-09-02 MED ORDER — BUSPIRONE HCL 5 MG PO TABS: 5.0000 mg | ORAL_TABLET | Freq: Three times a day (TID) | ORAL | 0 refills | Status: DC | PRN

## 2020-09-02 MED ORDER — CYCLOBENZAPRINE HCL 10 MG PO TABS: 5.0000 mg | ORAL_TABLET | Freq: Every day | ORAL | 0 refills | Status: DC

## 2020-09-03 ENCOUNTER — Ambulatory Visit: Payer: BC Managed Care – PPO | Admitting: Family Medicine

## 2020-09-13 DIAGNOSIS — S41111A Laceration without foreign body of right upper arm, initial encounter: Secondary | ICD-10-CM | POA: Insufficient documentation

## 2020-10-31 DIAGNOSIS — Z20822 Contact with and (suspected) exposure to covid-19: Secondary | ICD-10-CM | POA: Diagnosis not present

## 2020-12-07 ENCOUNTER — Other Ambulatory Visit: Payer: Self-pay

## 2020-12-07 ENCOUNTER — Encounter: Payer: Self-pay | Admitting: Family Medicine

## 2020-12-07 ENCOUNTER — Ambulatory Visit (INDEPENDENT_AMBULATORY_CARE_PROVIDER_SITE_OTHER): Payer: BC Managed Care – PPO | Admitting: Family Medicine

## 2020-12-07 VITALS — BP 126/74 | HR 100 | Temp 98.4°F | Resp 18 | Ht 72.0 in | Wt 194.9 lb

## 2020-12-07 DIAGNOSIS — Z1159 Encounter for screening for other viral diseases: Secondary | ICD-10-CM

## 2020-12-07 DIAGNOSIS — Z13220 Encounter for screening for lipoid disorders: Secondary | ICD-10-CM | POA: Diagnosis not present

## 2020-12-07 DIAGNOSIS — G8929 Other chronic pain: Secondary | ICD-10-CM

## 2020-12-07 DIAGNOSIS — F901 Attention-deficit hyperactivity disorder, predominantly hyperactive type: Secondary | ICD-10-CM

## 2020-12-07 DIAGNOSIS — M545 Low back pain, unspecified: Secondary | ICD-10-CM

## 2020-12-07 DIAGNOSIS — R002 Palpitations: Secondary | ICD-10-CM

## 2020-12-07 DIAGNOSIS — Z0000 Encounter for general adult medical examination without abnormal findings: Secondary | ICD-10-CM

## 2020-12-07 DIAGNOSIS — E538 Deficiency of other specified B group vitamins: Secondary | ICD-10-CM | POA: Diagnosis not present

## 2020-12-07 DIAGNOSIS — F410 Panic disorder [episodic paroxysmal anxiety] without agoraphobia: Secondary | ICD-10-CM

## 2020-12-07 DIAGNOSIS — Z131 Encounter for screening for diabetes mellitus: Secondary | ICD-10-CM

## 2020-12-07 DIAGNOSIS — F411 Generalized anxiety disorder: Secondary | ICD-10-CM

## 2020-12-07 DIAGNOSIS — J3089 Other allergic rhinitis: Secondary | ICD-10-CM

## 2020-12-07 DIAGNOSIS — Z79899 Other long term (current) drug therapy: Secondary | ICD-10-CM

## 2020-12-07 DIAGNOSIS — J302 Other seasonal allergic rhinitis: Secondary | ICD-10-CM

## 2020-12-07 MED ORDER — AMPHETAMINE-DEXTROAMPHET ER 25 MG PO CP24: 25.0000 mg | ORAL_CAPSULE | ORAL | 0 refills | Status: DC

## 2020-12-07 MED ORDER — BUSPIRONE HCL 5 MG PO TABS
5.0000 mg | ORAL_TABLET | Freq: Three times a day (TID) | ORAL | 0 refills | Status: DC | PRN
Start: 2020-12-07 — End: 2021-01-21

## 2020-12-07 MED ORDER — AMPHETAMINE-DEXTROAMPHETAMINE 15 MG PO TABS: 15.0000 mg | ORAL_TABLET | Freq: Every day | ORAL | 0 refills | Status: DC

## 2020-12-07 MED ORDER — AZELASTINE HCL 0.1 % NA SOLN: 1.0000 | Freq: Two times a day (BID) | NASAL | 2 refills | Status: DC

## 2020-12-07 MED ORDER — AMPHETAMINE-DEXTROAMPHETAMINE 15 MG PO TABS: 1.0000 | ORAL_TABLET | Freq: Every day | ORAL | 0 refills | Status: DC

## 2020-12-07 MED ORDER — CYCLOBENZAPRINE HCL 10 MG PO TABS: 5.0000 mg | ORAL_TABLET | Freq: Every day | ORAL | 0 refills | Status: DC

## 2020-12-07 MED ORDER — ALPRAZOLAM 0.5 MG PO TABS: 0.5000 mg | ORAL_TABLET | ORAL | 0 refills | Status: DC | PRN

## 2020-12-07 NOTE — Progress Notes (Signed)
Name: William Rivers   MRN: [id number]    DOB: 1991-11-13   Date:12/07/2020       Progress Note  Subjective  Chief Complaint  Annual Exam  HPI  Patient presents for annual CPE .  ADHD: he is taking medication as prescribed, denies side effects. He only started having palpitations over the past year, but has been on current dose of Adderal for many years  Palpitation: he developed symptoms about one year ago, he decreased caffeine intake and symptoms resolved, however over the past few months having frequent episodes again, happening a few times a day most days of the week. Sometimes has associated chest and mid upper back pain, he is not sure if related to anxiety. He states not at the same time as palpitation. He is concerned and would like to see cardiologist now   Anxiety: . 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 He states he sob improved when anxiety improved. He states he has been taking half of a xanax every other day to help with stress, usually at night. No panic attacks, and feeling better He states continues to have  sob but has more palpitation lately   Varicose veins: brother and grandfather have same problems. He states he has noticed leg heaviness and the bulging is worse at the end of the day. He denies lower extremity edema. He has been wearing compression stocking hoses a couple times a week and it seems to help . Unchanged   Chronic low back pain: usually at the end of the day, feels more stiff than painful, he states stable. Used to have radiculitis but not recently Not taking gabapentin, but takes flexeril prn   IPSS Questionnaire (AUA-7): Over the past month.   1)  How often have you had a sensation of not emptying your bladder completely after you finish urinating?  0 - Not at all  2)  How often have you had to urinate again less than two hours after you finished urinating? 0 - Not at all  3)  How  often have you found you stopped and started again several times when you urinated?  0 - Not at all  4) How difficult have you found it to postpone urination?  0 - Not at all  5) How often have you had a weak urinary stream?  0 - Not at all  6) How often have you had to push or strain to begin urination?  0 - Not at all  7) How many times did you most typically get up to urinate from the time you went to bed until the time you got up in the morning?  0 - None  Total score:  0-7 mildly symptomatic   8-19 moderately symptomatic   20-35 severely symptomatic     Diet: eats at home most of the time Exercise: not enough time, working 12 hours per day   Depression: phq 9 is negative  [facility] 12/07/2020 12/07/2020 12/07/2020 09/02/2020 06/14/2020  Decreased Interest 0 0 0 0 0  Down, Depressed, Hopeless 0 0 0 0 0  PHQ - 2 Score 0 0 0 0 0  Altered sleeping 0 - - - 0  Tired, decreased energy 1 - - - 0  Change in appetite 0 - - - 0  Feeling bad or failure about yourself  0 - - - 0  Trouble concentrating 0 - - -  0  Moving slowly or fidgety/restless 0 - - - 0  Suicidal thoughts 0 - - - 0  PHQ-9 Score 1 - - - 0  Difficult doing work/chores Not difficult at all - - - -  Some recent data might be hidden    Hypertension:  BP Readings from Last 3 Encounters:  12/07/20 126/74  09/02/20 122/84  06/14/20 124/70    Obesity: Wt Readings from Last 3 Encounters:  12/07/20 194 lb 14.4 oz (88.4 kg)  09/02/20 191 lb 8 oz (86.9 kg)  06/14/20 198 lb 9.6 oz (90.1 kg)   BMI Readings from Last 3 Encounters:  12/07/20 26.43 kg/m  09/02/20 25.97 kg/m  06/14/20 26.94 kg/m     Lipids:  Lab Results  Component Value Date   CHOL 133 11/09/2017   Lab Results  Component Value Date   HDL 45 11/09/2017   Lab Results  Component Value Date   LDLCALC 73 11/09/2017   Lab Results  Component Value Date   TRIG 66 11/09/2017   Lab Results  Component Value Date   CHOLHDL 3.0 11/09/2017    No results found for: LDLDIRECT Glucose:  Glucose, Bld  Date Value Ref Range Status  01/15/2019 83 65 - 99 mg/dL Final    Comment:    .            Fasting reference interval .   12/26/2018 114 (H) 70 - 99 mg/dL Final  17/00/1749 74 65 - 99 mg/dL Final    Comment:    .            Fasting reference interval .     Flowsheet Row Office Visit from [facility]/07/[facility]  AUDIT-C Score 1      Married STD testing and prevention (HIV/chl/gon/syphilis): not interested  Hep C: N/A  Skin cancer: Discussed monitoring for atypical lesions Colorectal cancer: N/A Prostate cancer: N/A   Lung cancer:   Low Dose CT Chest recommended if Age 61-80 years, 30 pack-year currently smoking OR have quit w/in 15years. Patient does not qualify.   AAA:  The USPSTF recommends one-time screening with ultrasonography in men ages 81 to 81 years who have ever smoked ECG:  01/15/19  Vaccines:  HPV: up to at age 77 , ask insurance if age between 62-45  Flu:  educated and discussed with patient.  Advanced Care Planning: A voluntary discussion about advance care planning including the explanation and discussion of advance directives.  Discussed health care proxy and Living will, and the patient was able to identify a health care proxy as wife   Patient does not have a living will at present time.  Patient Active Problem List   Diagnosis Date Noted  . Arm laceration, right, initial encounter 09/13/2020  . ADD (attention deficit disorder) 05/29/2015  . Chronic LBP 05/29/2015  . Lactose intolerance 05/29/2015  . Panic attack 05/29/2015  . Perennial allergic rhinitis with seasonal variation 05/29/2015  . Lumbosacral radiculitis 05/29/2015    Past Surgical History:  Procedure Laterality Date  . TONSILLECTOMY    . VASECTOMY Bilateral 02/21/2018    Family History  Problem Relation Age of Onset  . Anxiety disorder Mother     Social History   Socioeconomic History  .  Marital status: Married    Spouse name: Ave Filter   . Number of children: 3  . Years of education: Not on file  . Highest education level: 12th grade  Occupational History  . Occupation: Personnel officer  Tobacco Use  . Smoking status: Never Smoker  . Smokeless tobacco: Current User    Types: Chew  Vaping Use  . Vaping Use: Never used  Substance and Sexual Activity  . Alcohol use: Yes    Alcohol/week: 18.0 standard drinks    Types: 6 Cans of beer, 12 Standard drinks or equivalent per week  . Drug use: No  . Sexual activity: Yes    Partners: Female    Birth control/protection: I.U.D.  Other Topics Concern  . Not on file  Social History Narrative   Living with wife  - Ave Filter she is mother of one of his kids ( son)    He has two older children from previous relationship.    Social Determinants of Health   Financial Resource Strain: Low Risk   . Difficulty of Paying Living Expenses: Not very hard  Food Insecurity: No Food Insecurity  . Worried About Programme researcher, broadcasting/film/video in the Last Year: Never true  . Ran Out of Food in the Last Year: Never true  Transportation Needs: No Transportation Needs  . Lack of Transportation (Medical): No  . Lack of Transportation (Non-Medical): No  Physical Activity: Insufficiently Active  . Days of Exercise per Week: 7 days  . Minutes of Exercise per Session: 10 min  Stress: Stress Concern Present  . Feeling of Stress : To some extent  Social Connections: Moderately Isolated  . Frequency of Communication with Friends and Family: Three times a week  . Frequency of Social Gatherings with Friends and Family: Twice a week  . Attends Religious Services: Never  . Active Member of Clubs or Organizations: No  . Attends Banker Meetings: Never  . Marital Status: Married  Catering manager Violence: Not At Risk  . Fear of Current or Ex-Partner: No  . Emotionally Abused: No  . Physically Abused: No  . Sexually Abused: No     Current  Outpatient Medications:  .  Cyanocobalamin (B-12) 1000 MCG SUBL, Place 1 tablet under the tongue daily., Disp: 100 tablet, Rfl: 1 .  ALPRAZolam (XANAX) 0.5 MG tablet, Take 1 tablet (0.5 mg total) by mouth as needed for anxiety., Disp: 35 tablet, Rfl: 0 .  amphetamine-dextroamphetamine (ADDERALL XR) 25 MG 24 hr capsule, Take 1 capsule by mouth every morning., Disp: 30 capsule, Rfl: 0 .  amphetamine-dextroamphetamine (ADDERALL XR) 25 MG 24 hr capsule, Take 1 capsule by mouth every morning., Disp: 30 capsule, Rfl: 0 .  amphetamine-dextroamphetamine (ADDERALL XR) 25 MG 24 hr capsule, Take 1 capsule by mouth every morning., Disp: 30 capsule, Rfl: 0 .  amphetamine-dextroamphetamine (ADDERALL) 15 MG tablet, Take 1 tablet by mouth daily. At lunch-when working long hours, Disp: 30 tablet, Rfl: 0 .  amphetamine-dextroamphetamine (ADDERALL) 15 MG tablet, Take 1 tablet by mouth daily., Disp: 30 tablet, Rfl: 0 .  amphetamine-dextroamphetamine (ADDERALL) 15 MG tablet, Take 1 tablet by mouth daily., Disp: 30 tablet, Rfl: 0 .  azelastine (ASTELIN) 0.1 % nasal spray, Place 1 spray into both nostrils 2 (two) times daily. Use in each nostril as directed, Disp: 30 mL, Rfl: 2 .  busPIRone (BUSPAR) 5 MG tablet, Take 1 tablet (5 mg total) by mouth 3 (three) times daily as needed., Disp: 30 tablet, Rfl: 0 .  cyclobenzaprine (FLEXERIL) 10 MG tablet, Take 0.5-1 tablets (5-10 mg total) by mouth at bedtime., Disp: 90 tablet, Rfl: 0  No Known Allergies   ROS  Constitutional: Negative for fever or weight change.  Respiratory: Negative for cough  and shortness of breath.   Cardiovascular: Negative for chest pain , positive for palpitations.  Gastrointestinal: Negative for abdominal pain, no bowel changes.  Musculoskeletal: Negative for gait problem or joint swelling.  Skin: Negative for rash.  Neurological: Negative for dizziness or headache.  No other specific complaints in a complete review of systems (except as listed  in HPI above).  Objective  Vitals:   12/07/20 1322  BP: 126/74  Pulse: 100  Resp: 18  Temp: 98.4 F (36.9 C)  TempSrc: Oral  SpO2: 98%  Weight: 194 lb 14.4 oz (88.4 kg)  Height: 6' (1.829 m)    Body mass index is 26.43 kg/m.  Physical Exam  Constitutional: Patient appears well-developed and well-nourished. No distress.  HENT: Head: Normocephalic and atraumatic. Ears: B TMs ok, no erythema or effusion; Nose: Not done  Mouth/Throat: not done  Eyes: Conjunctivae and EOM are normal. Pupils are equal, round, and reactive to light. No scleral icterus.  Neck: Normal range of motion. Neck supple. No JVD present. No thyromegaly present.  Cardiovascular: Normal rate, regular rhythm and normal heart sounds.  No murmur heard. No BLE edema. Pulmonary/Chest: Effort normal and breath sounds normal. No respiratory distress. Abdominal: Soft. Bowel sounds are normal, no distension. There is no tenderness. no masses MALE GENITALIA: Normal descended testes bilaterally, no masses palpated, no hernias, no lesions, no discharge RECTAL: not done  Musculoskeletal: Normal range of motion, no joint effusions. No gross deformities Neurological: he is alert and oriented to person, place, and time. No cranial nerve deficit. Coordination, balance, strength, speech and gait are normal.  Skin: Skin is warm and dry. No rash noted. No erythema.  Psychiatric: Patient has a normal mood and affect. behavior is normal. Judgment and thought content normal.  Fall Risk: Fall Risk  12/07/2020 09/02/2020 06/14/2020 03/04/2020 09/22/2019  Falls in the past year? 0 0 0 0 0  Number falls in past yr: 0 0 0 0 0  Injury with Fall? 0 0 0 0 0  Follow up - Falls evaluation completed - Falls evaluation completed -     Functional Status Survey: Is the patient deaf or have difficulty hearing?: No Does the patient have difficulty seeing, even when wearing glasses/contacts?: No Does the patient have difficulty concentrating,  remembering, or making decisions?: Yes Does the patient have difficulty walking or climbing stairs?: No Does the patient have difficulty dressing or bathing?: No Does the patient have difficulty doing errands alone such as visiting a doctor's office or shopping?: No    Assessment & Plan  1. Well adult exam  - Lipid panel - CBC with Differential/Platelet - COMPLETE METABOLIC PANEL WITH GFR - Vitamin B12 - Hemoglobin A1c  2. Need for hepatitis C screening test  - Hepatitis C Antibody  3. B12 deficiency  - Vitamin B12  4. Chronic bilateral low back pain without sciatica  - cyclobenzaprine (FLEXERIL) 10 MG tablet; Take 0.5-1 tablets (5-10 mg total) by mouth at bedtime.  Dispense: 90 tablet; Refill: 0  5. Attention deficit hyperactivity disorder (ADHD), predominantly hyperactive type  - amphetamine-dextroamphetamine (ADDERALL XR) 25 MG 24 hr capsule; Take 1 capsule by mouth every morning.  Dispense: 30 capsule; Refill: 0 - amphetamine-dextroamphetamine (ADDERALL XR) 25 MG 24 hr capsule; Take 1 capsule by mouth every morning.  Dispense: 30 capsule; Refill: 0 - amphetamine-dextroamphetamine (ADDERALL XR) 25 MG 24 hr capsule; Take 1 capsule by mouth every morning.  Dispense: 30 capsule; Refill: 0 - amphetamine-dextroamphetamine (ADDERALL) 15 MG tablet; Take 1  tablet by mouth daily. At lunch-when working long hours  Dispense: 30 tablet; Refill: 0 - amphetamine-dextroamphetamine (ADDERALL) 15 MG tablet; Take 1 tablet by mouth daily.  Dispense: 30 tablet; Refill: 0 - amphetamine-dextroamphetamine (ADDERALL) 15 MG tablet; Take 1 tablet by mouth daily.  Dispense: 30 tablet; Refill: 0  6. Perennial allergic rhinitis with seasonal variation  - azelastine (ASTELIN) 0.1 % nasal spray; Place 1 spray into both nostrils 2 (two) times daily. Use in each nostril as directed  Dispense: 30 mL; Refill: 2  7. GAD (generalized anxiety disorder)   8. Long-term use of high-risk medication  - CBC  with Differential/Platelet - COMPLETE METABOLIC PANEL WITH GFR  9. Diabetes mellitus screening  - Hemoglobin A1c  10. Lipid screening  - Lipid panel  11. Palpitation  - TSH - Ambulatory referral to Cardiology  12. Panic attack  - ALPRAZolam (XANAX) 0.5 MG tablet; Take 1 tablet (0.5 mg total) by mouth as needed for anxiety.  Dispense: 35 tablet; Refill: 0   -Prostate cancer screening and PSA options (with potential risks and benefits of testing vs not testing) were discussed along with recent recs/guidelines. -USPSTF grade A and B recommendations reviewed with patient; age-appropriate recommendations, preventive care, screening tests, etc discussed and encouraged; healthy living encouraged; see AVS for patient education given to patient -Discussed importance of 150 minutes of physical activity weekly, eat two servings of fish weekly, eat one serving of tree nuts ( cashews, pistachios, pecans, almonds.Marland Kitchen) every other day, eat 6 servings of fruit/vegetables daily and drink plenty of water and avoid sweet beverages.

## 2020-12-07 NOTE — Patient Instructions (Signed)
Preventive Care 21-32 Years Old, Male Preventive care refers to lifestyle choices and visits with your health care provider that can promote health and wellness. This includes:  A yearly physical exam. This is also called an annual wellness visit.  Regular dental and eye exams.  Immunizations.  Screening for certain conditions.  Healthy lifestyle choices, such as: ? Eating a healthy diet. ? Getting regular exercise. ? Not using drugs or products that contain nicotine and tobacco. ? Limiting alcohol use. What can I expect for my preventive care visit? Physical exam Your health care provider may check your:  Height and weight. These may be used to calculate your BMI (body mass index). BMI is a measurement that tells if you are at a healthy weight.  Heart rate and blood pressure.  Body temperature.  Skin for abnormal spots. Counseling Your health care provider may ask you questions about your:  Past medical problems.  Family's medical history.  Alcohol, tobacco, and drug use.  Emotional well-being.  Home life and relationship well-being.  Sexual activity.  Diet, exercise, and sleep habits.  Work and work environment.  Access to firearms. What immunizations do I need? Vaccines are usually given at various ages, according to a schedule. Your health care provider will recommend vaccines for you based on your age, medical history, and lifestyle or other factors, such as travel or where you work.   What tests do I need? Blood tests  Lipid and cholesterol levels. These may be checked every 5 years starting at age 20.  Hepatitis C test.  Hepatitis B test. Screening  Diabetes screening. This is done by checking your blood sugar (glucose) after you have not eaten for a while (fasting).  Genital exam to check for testicular cancer or hernias.  STD (sexually transmitted disease) testing, if you are at risk. Talk with your health care provider about your test results,  treatment options, and if necessary, the need for more tests.   Follow these instructions at home: Eating and drinking  Eat a healthy diet that includes fresh fruits and vegetables, whole grains, lean protein, and low-fat dairy products.  Drink enough fluid to keep your urine pale yellow.  Take vitamin and mineral supplements as recommended by your health care provider.  Do not drink alcohol if your health care provider tells you not to drink.  If you drink alcohol: ? Limit how much you have to 0-2 drinks a day. ? Be aware of how much alcohol is in your drink. In the [place] one drink equals one 12 oz bottle of beer (355 mL), one 5 oz glass of wine (148 mL), or one 1 oz glass of hard liquor (44 mL).   Lifestyle  Take daily care of your teeth and gums. Brush your teeth every morning and night with fluoride toothpaste. Floss one time each day.  Stay active. Exercise for at least 30 minutes 5 or more days each week.  Do not use any products that contain nicotine or tobacco, such as cigarettes, e-cigarettes, and chewing tobacco. If you need help quitting, ask your health care provider.  Do not use drugs.  If you are sexually active, practice safe sex. Use a condom or other form of protection to prevent STIs (sexually transmitted infections).  Find healthy ways to cope with stress, such as: ? Meditation, yoga, or listening to music. ? Journaling. ? Talking to a trusted person. ? Spending time with friends and family. Safety  Always wear your seat belt while driving   or riding in a vehicle.  Do not drive: ? If you have been drinking alcohol. Do not ride with someone who has been drinking. ? When you are tired or distracted. ? While texting.  Wear a helmet and other protective equipment during sports activities.  If you have firearms in your house, make sure you follow all gun safety procedures.  Seek help if you have been physically or sexually abused. What's next?  Go to your  health care provider once a year for an annual wellness visit.  Ask your health care provider how often you should have your eyes and teeth checked.  Stay up to date on all vaccines. This information is not intended to replace advice given to you by your health care provider. Make sure you discuss any questions you have with your health care provider. Document Revised: 07/23/2019 Document Reviewed: 10/31/2018 Elsevier Patient Education  2021 Elsevier Inc.  

## 2020-12-08 LAB — COMPLETE METABOLIC PANEL WITH GFR
AG Ratio: 2.5 (calc) (ref 1.0–2.5)
ALT: 20 U/L (ref 9–46)
AST: 15 U/L (ref 10–40)
Albumin: 4.9 g/dL (ref 3.6–5.1)
Alkaline phosphatase (APISO): 59 U/L (ref 36–130)
BUN: 14 mg/dL (ref 7–25)
CO2: 31 mmol/L (ref 20–32)
Calcium: 9.7 mg/dL (ref 8.6–10.3)
Chloride: 103 mmol/L (ref 98–110)
Creat: 1.24 mg/dL (ref 0.60–1.35)
GFR, Est African American: 89 mL/min/{1.73_m2} (ref >=60)
GFR, Est Non African American: 77 mL/min/{1.73_m2} (ref >=60)
Globulin: 2 g/dL (calc) (ref 1.9–3.7)
Glucose, Bld: 91 mg/dL (ref 65–99)
Potassium: 4 mmol/L (ref 3.5–5.3)
Sodium: 141 mmol/L (ref 135–146)
Total Bilirubin: 0.5 mg/dL (ref 0.2–1.2)
Total Protein: 6.9 g/dL (ref 6.1–8.1)

## 2020-12-08 LAB — LIPID PANEL
Cholesterol: 190 mg/dL (ref <200)
HDL: 59 mg/dL (ref >=40)
LDL Cholesterol (Calc): 115 mg/dL (calc) — ABNORMAL HIGH
Non-HDL Cholesterol (Calc): 131 mg/dL (calc) — ABNORMAL HIGH (ref <130)
Total CHOL/HDL Ratio: 3.2 (calc) (ref <5.0)
Triglycerides: 67 mg/dL (ref <150)

## 2020-12-08 LAB — CBC WITH DIFFERENTIAL/PLATELET
Absolute Monocytes: 524 cells/uL (ref 200–950)
Basophils Absolute: 22 cells/uL (ref 0–200)
Basophils Relative: 0.4 %
Eosinophils Absolute: 38 cells/uL (ref 15–500)
Eosinophils Relative: 0.7 %
HCT: 40.9 % (ref 38.5–50.0)
Hemoglobin: 13.9 g/dL (ref 13.2–17.1)
Lymphs Abs: 1004 cells/uL (ref 850–3900)
MCH: 29.6 pg (ref 27.0–33.0)
MCHC: 34 g/dL (ref 32.0–36.0)
MCV: 87.2 fL (ref 80.0–100.0)
MPV: 10.3 fL (ref 7.5–12.5)
Monocytes Relative: 9.7 %
Neutro Abs: 3812 cells/uL (ref 1500–7800)
Neutrophils Relative %: 70.6 %
Platelets: 278 10*3/uL (ref 140–400)
RBC: 4.69 10*6/uL (ref 4.20–5.80)
RDW: 11.9 % (ref 11.0–15.0)
Total Lymphocyte: 18.6 %
WBC: 5.4 10*3/uL (ref 3.8–10.8)

## 2020-12-08 LAB — HEMOGLOBIN A1C
Hgb A1c MFr Bld: 5.3 % of total Hgb (ref <5.7)
Mean Plasma Glucose: 105 mg/dL
eAG (mmol/L): 5.8 mmol/L

## 2020-12-08 LAB — HEPATITIS C ANTIBODY
Hepatitis C Ab: NONREACTIVE
SIGNAL TO CUT-OFF: 0.01 (ref <1.00)

## 2020-12-08 LAB — VITAMIN B12: Vitamin B-12: 301 pg/mL (ref 200–1100)

## 2020-12-08 LAB — TSH: TSH: 1.21 mIU/L (ref 0.40–4.50)

## 2021-01-21 ENCOUNTER — Ambulatory Visit: Payer: BC Managed Care – PPO | Admitting: Cardiology

## 2021-01-21 ENCOUNTER — Other Ambulatory Visit: Payer: Self-pay

## 2021-01-21 ENCOUNTER — Ambulatory Visit (INDEPENDENT_AMBULATORY_CARE_PROVIDER_SITE_OTHER): Payer: BC Managed Care – PPO

## 2021-01-21 ENCOUNTER — Encounter: Payer: Self-pay | Admitting: Cardiology

## 2021-01-21 VITALS — BP 140/80 | HR 95 | Ht 73.0 in | Wt 193.0 lb

## 2021-01-21 DIAGNOSIS — R002 Palpitations: Secondary | ICD-10-CM

## 2021-01-21 DIAGNOSIS — I499 Cardiac arrhythmia, unspecified: Secondary | ICD-10-CM

## 2021-01-21 DIAGNOSIS — R030 Elevated blood-pressure reading, without diagnosis of hypertension: Secondary | ICD-10-CM | POA: Diagnosis not present

## 2021-01-21 NOTE — Patient Instructions (Signed)
Medication Instructions:   1. Your physician recommends that you continue on your current medications as directed. Please refer to the Current Medication list given to you today.  *If you need a refill on your cardiac medications before your next appointment, please call your pharmacy*   Lab Work:  1. None   If you have labs (blood work) drawn today and your tests are completely normal, you will receive your results only by: Marland Kitchen MyChart Message (if you have MyChart) OR . A paper copy in the mail If you have any lab test that is abnormal or we need to change your treatment, we will call you to review the results.   Testing/Procedures:  1. A zio monitor was placed today. It will remain on for 14 days. You will then return monitor and event diary in provided box. It takes 1-2 weeks for report to be downloaded and returned to [place]. We will call you with the results. If monitor falls of or has orange flashing light, please call Zio for further instructions.    Follow-Up: At [facility], you and your health needs are our priority.  As part of our continuing mission to provide you with exceptional heart care, we have created designated Provider Care Teams.  These Care Teams include your primary Cardiologist (physician) and Advanced Practice Providers (APPs -  Physician Assistants and Nurse Practitioners) who all work together to provide you with the care you need, when you need it.  We recommend signing up for the patient portal called "MyChart".  Sign up information is provided on this After Visit Summary.  MyChart is used to connect with patients for Virtual Visits (Telemedicine).  Patients are able to view lab/test results, encounter notes, upcoming appointments, etc.  Non-urgent messages can be sent to your provider as well.   To learn more about what you can do with MyChart, go to [email or web address].    Your next appointment:   After Zio   The format for your next appointment:   In  Person  Provider:   Debbe Odea, MD

## 2021-01-21 NOTE — Progress Notes (Signed)
Cardiology Office Note:    Date:  01/21/2021   ID:  William Rivers, DOB 1991/07/07, MRN [id number]  PCP:  Alba Cory, MD   [facility]  Cardiologist:  Debbe Odea, MD  Advanced Practice Provider:  No care team member to display Electrophysiologist:  None       Referring MD: Alba Cory, MD   Chief Complaint  Patient presents with  . New Patient (Initial Visit)    Referred by PCP for palpitations. Meds reviewed verbally with patient.    William Rivers is a 30 y.o. male who is being seen today for the evaluation of palpitations at the request of Alba Cory, MD.   History of Present Illness:    William Rivers is a 30 y.o. male with a hx of ADHD, anxiety who presents due to irregular heartbeat.  Since states having skipped heartbeats for about a year now.  Symptoms are still about the same.  He states noticing an irregular/skipped heartbeat occasionally which causes him to be out of breath when he happens.  He denies any history of heart disease.  Denies chest pain, shortness of breath with exertion, edema.  Denies smoking.  Grandparent with A. fib.  He works in Holiday representative, notices occasional cough discomfort, needs better work shoes.  Past Medical History:  Diagnosis Date  . ADD (attention deficit disorder)   . Allergy   . Anxiety   . Back pain     Past Surgical History:  Procedure Laterality Date  . TONSILLECTOMY    . VASECTOMY Bilateral 02/21/2018    Current Medications: Current Meds  Medication Sig  . cyclobenzaprine (FLEXERIL) 10 MG tablet Take 0.5-1 tablets (5-10 mg total) by mouth at bedtime.     Allergies:   Patient has no known allergies.   Social History   Socioeconomic History  . Marital status: Married    Spouse name: Ave Filter   . Number of children: 3  . Years of education: Not on file  . Highest education level: 12th grade  Occupational History  . Occupation: Personnel officer   Tobacco Use  . Smoking status:  Never Smoker  . Smokeless tobacco: Current User    Types: Chew  Vaping Use  . Vaping Use: Never used  Substance and Sexual Activity  . Alcohol use: Yes    Alcohol/week: 18.0 standard drinks    Types: 6 Cans of beer, 12 Standard drinks or equivalent per week  . Drug use: No  . Sexual activity: Yes    Partners: Female    Birth control/protection: I.U.D.  Other Topics Concern  . Not on file  Social History Narrative   Living with wife  - Ave Filter she is mother of one of his kids ( son)    He has two older children from previous relationship.    Social Determinants of Health   Financial Resource Strain: Low Risk   . Difficulty of Paying Living Expenses: Not very hard  Food Insecurity: No Food Insecurity  . Worried About Programme researcher, broadcasting/film/video in the Last Year: Never true  . Ran Out of Food in the Last Year: Never true  Transportation Needs: No Transportation Needs  . Lack of Transportation (Medical): No  . Lack of Transportation (Non-Medical): No  Physical Activity: Insufficiently Active  . Days of Exercise per Week: 7 days  . Minutes of Exercise per Session: 10 min  Stress: Stress Concern Present  . Feeling of Stress : To some extent  Social Connections: Moderately Isolated  .  Frequency of Communication with Friends and Family: Three times a week  . Frequency of Social Gatherings with Friends and Family: Twice a week  . Attends Religious Services: Never  . Active Member of Clubs or Organizations: No  . Attends Banker Meetings: Never  . Marital Status: Married     Family History: The patient's family history includes Anxiety disorder in his mother.  ROS:   Please see the history of present illness.     All other systems reviewed and are negative.  EKGs/Labs/Other Studies Reviewed:    The following studies were reviewed today:   EKG:  EKG is  ordered today.  The ekg ordered today demonstrates normal sinus rhythm, normal ECG  Recent Labs: 12/07/2020: ALT  20; BUN 14; Creat 1.24; Hemoglobin 13.9; Platelets 278; Potassium 4.0; Sodium 141; TSH 1.21  Recent Lipid Panel    Component Value Date/Time   CHOL 190 12/07/2020 1405   TRIG 67 12/07/2020 1405   HDL 59 12/07/2020 1405   CHOLHDL 3.2 12/07/2020 1405   LDLCALC 115 (H) 12/07/2020 1405     Risk Assessment/Calculations:      Physical Exam:    VS:  BP 140/80 (BP Location: Left Arm, Patient Position: Sitting, Cuff Size: Normal)   Pulse 95   Ht 6\' 1"  (1.854 m)   Wt 193 lb (87.5 kg)   SpO2 98%   BMI 25.46 kg/m     Wt Readings from Last 3 Encounters:  01/21/21 193 lb (87.5 kg)  12/07/20 194 lb 14.4 oz (88.4 kg)  09/02/20 191 lb 8 oz (86.9 kg)     GEN:  Well nourished, well developed in no acute distress HEENT: Normal NECK: No JVD; No carotid bruits LYMPHATICS: No lymphadenopathy CARDIAC: RRR, no murmurs, rubs, gallops RESPIRATORY:  Clear to auscultation without rales, wheezing or rhonchi  ABDOMEN: Soft, non-tender, non-distended MUSCULOSKELETAL:  No edema; No deformity  SKIN: Warm and dry NEUROLOGIC:  Alert and oriented x 3 PSYCHIATRIC:  Normal affect   ASSESSMENT:    1. Irregular heart beat   2. Elevated BP without diagnosis of hypertension   3. Palpitations    PLAN:    In order of problems listed above:  1. Patient with irregular/skipped heartbeats.  It could be possible patient has premature atrial complexes with compensatory pauses.  Use of Adderall possibly contributing to symptoms.  We will place a cardiac monitor to evaluate for any other arrhythmias. 2. BP is elevated, previous BP checks in EMR has been normal.  Continue to monitor  Follow-up after cardiac monitor    Medication Adjustments/Labs and Tests Ordered: Current medicines are reviewed at length with the patient today.  Concerns regarding medicines are outlined above.  Orders Placed This Encounter  Procedures  . LONG TERM MONITOR (3-14 DAYS)  . EKG 12-Lead   No orders of the defined types were  placed in this encounter.   Patient Instructions  Medication Instructions:   1. Your physician recommends that you continue on your current medications as directed. Please refer to the Current Medication list given to you today.  *If you need a refill on your cardiac medications before your next appointment, please call your pharmacy*   Lab Work:  1. None   If you have labs (blood work) drawn today and your tests are completely normal, you will receive your results only by: 09/04/20 MyChart Message (if you have MyChart) OR . A paper copy in the mail If you have any lab test that is  abnormal or we need to change your treatment, we will call you to review the results.   Testing/Procedures:  1. A zio monitor was placed today. It will remain on for 14 days. You will then return monitor and event diary in provided box. It takes 1-2 weeks for report to be downloaded and returned to [place]. We will call you with the results. If monitor falls of or has orange flashing light, please call Zio for further instructions.    Follow-Up: At [facility], you and your health needs are our priority.  As part of our continuing mission to provide you with exceptional heart care, we have created designated Provider Care Teams.  These Care Teams include your primary Cardiologist (physician) and Advanced Practice Providers (APPs -  Physician Assistants and Nurse Practitioners) who all work together to provide you with the care you need, when you need it.  We recommend signing up for the patient portal called "MyChart".  Sign up information is provided on this After Visit Summary.  MyChart is used to connect with patients for Virtual Visits (Telemedicine).  Patients are able to view lab/test results, encounter notes, upcoming appointments, etc.  Non-urgent messages can be sent to your provider as well.   To learn more about what you can do with MyChart, go to [email or web address].    Your next appointment:    After Zio   The format for your next appointment:   In Person  Provider:   Debbe Odea, MD      Signed, Debbe Odea, MD  01/21/2021 4:44 PM    [facility]

## 2021-02-11 ENCOUNTER — Ambulatory Visit: Payer: BC Managed Care – PPO | Admitting: Cardiology

## 2021-02-15 DIAGNOSIS — R002 Palpitations: Secondary | ICD-10-CM | POA: Diagnosis not present

## 2021-02-17 NOTE — Progress Notes (Signed)
Name: William Rivers   MRN: [id number]    DOB: March 08, 1991   Date:02/18/2021       Progress Note  Subjective  Chief Complaint  Follow up   HPI   ADHD: he is taking medication as prescribed, denies side effects. He only started having palpitations over the past year, but has been on current dose of Adderal for many years. He needs a refill. He is aware it is controlled medication and not to share.   Palpitation: he developed symptoms about one year ago, he decreased caffeine intake and symptoms resolved, however it increased prior to his last visit, , it was happening  few times a day most days of the week. Sometimes has associated chest and mid upper back pain, he was  not sure if related to anxiety. Since last visit he saw  cardiologist and had a holter monitor and has a follow up coming up to discuss results.    Anxiety: . 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 He states he sob improved when anxiety improved. He states he has been taking half of a xanax every other day to help with stress, he is making it last the three months.   Varicose veins: brother and grandfather have same problems. He states he has noticed leg heaviness and the bulging is worse at the end of the day. He denies lower extremity edema. He has been wearing compression stocking hoses a couple times a week he also has a pump at home that he uses a couple of times a week.   Chronic low back pain: pain is described as constant and dull, usually at the end of the day, feels more stiff than painful, he states stable. Used to have radiculitis but not recently , he stopped taking gabapentin,  but takes flexeril prn   AR: he states he has nasal congestion and a sinus infection this time of the year, he takes otc medication. We will send Astelin to pharmacy to take prn   Vitamin B12: he has been taking otc B12 but not consistently    Patient Active Problem List    Diagnosis Date Noted  . Arm laceration, right, initial encounter 09/13/2020  . ADD (attention deficit disorder) 05/29/2015  . Chronic LBP 05/29/2015  . Lactose intolerance 05/29/2015  . Panic attack 05/29/2015  . Perennial allergic rhinitis with seasonal variation 05/29/2015  . Lumbosacral radiculitis 05/29/2015    Past Surgical History:  Procedure Laterality Date  . TONSILLECTOMY    . VASECTOMY Bilateral 02/21/2018    Family History  Problem Relation Age of Onset  . Anxiety disorder Mother     Social History   Tobacco Use  . Smoking status: Never Smoker  . Smokeless tobacco: Current User    Types: Chew  Substance Use Topics  . Alcohol use: Yes    Alcohol/week: 18.0 standard drinks    Types: 6 Cans of beer, 12 Standard drinks or equivalent per week     Current Outpatient Medications:  .  ALPRAZolam (XANAX) 0.5 MG tablet, Take 1 tablet (0.5 mg total) by mouth as needed for anxiety., Disp: 35 tablet, Rfl: 0 .  amphetamine-dextroamphetamine (ADDERALL XR) 25 MG 24 hr capsule, Take 1 capsule by mouth every morning., Disp: 30 capsule, Rfl: 0 .  amphetamine-dextroamphetamine (ADDERALL XR) 25 MG 24 hr capsule, Take 1 capsule by mouth every morning., Disp: 30 capsule, Rfl: 0 .  amphetamine-dextroamphetamine (  ADDERALL XR) 25 MG 24 hr capsule, Take 1 capsule by mouth every morning., Disp: 30 capsule, Rfl: 0 .  amphetamine-dextroamphetamine (ADDERALL) 15 MG tablet, Take 1 tablet by mouth daily. At lunch-when working long hours, Disp: 30 tablet, Rfl: 0 .  amphetamine-dextroamphetamine (ADDERALL) 15 MG tablet, Take 1 tablet by mouth daily., Disp: 30 tablet, Rfl: 0 .  amphetamine-dextroamphetamine (ADDERALL) 15 MG tablet, Take 1 tablet by mouth daily., Disp: 30 tablet, Rfl: 0 .  cyclobenzaprine (FLEXERIL) 10 MG tablet, Take 0.5-1 tablets (5-10 mg total) by mouth at bedtime., Disp: 90 tablet, Rfl: 0  No Known Allergies  I personally reviewed active problem list, medication list,  allergies, family history, social history, health maintenance with the patient/caregiver today.   ROS  Constitutional: Negative for fever or weight change.  Respiratory: Negative for cough and shortness of breath.   Cardiovascular: Negative for chest pain, positive for intermittent palpitations.  Gastrointestinal: Negative for abdominal pain, no bowel changes.  Musculoskeletal: Negative for gait problem or joint swelling.  Skin: Negative for rash.  Neurological: Negative for dizziness or headache.  No other specific complaints in a complete review of systems (except as listed in HPI above).  Objective  Vitals:   02/18/21 1314  BP: 120/80  Pulse: 87  Resp: 18  Temp: 98.5 F (36.9 C)  TempSrc: Oral  SpO2: 99%  Weight: 190 lb 14.4 oz (86.6 kg)  Height: 6' (1.829 m)    Body mass index is 25.89 kg/m.  Physical Exam  Constitutional: Patient appears well-developed and well-nourished. No distress.  HEENT: head atraumatic, normocephalic, pupils equal and reactive to light, neck supple Cardiovascular: Normal rate, regular rhythm and normal heart sounds.  No murmur heard. No BLE edema. Pulmonary/Chest: Effort normal and breath sounds normal. No respiratory distress. Abdominal: Soft.  There is no tenderness. Psychiatric: Patient has a normal mood and affect. behavior is normal. Judgment and thought content normal.  Recent Results (from the past 2160 hour(s))  Hepatitis C Antibody     Status: None   Collection Time: 12/07/20  2:05 PM  Result Value Ref Range   Hepatitis C Ab NON-REACTIVE NON-REACTI   SIGNAL TO CUT-OFF 0.01 <1.00    Comment: . HCV antibody was non-reactive. There is no laboratory  evidence of HCV infection. . In most cases, no further action is required. However, if recent HCV exposure is suspected, a test for HCV RNA (test code 78295) is suggested. . For additional information please refer to http://education.[facility].com/faq/FAQ22v1 (This link is  being provided for informational/ educational purposes only.) .   Lipid panel     Status: Abnormal   Collection Time: 12/07/20  2:05 PM  Result Value Ref Range   Cholesterol 190 <200 mg/dL   HDL 59 > OR = 40 mg/dL   Triglycerides 67 <621 mg/dL   LDL Cholesterol (Calc) 115 (H) mg/dL (calc)    Comment: Reference range: <100 . Desirable range <100 mg/dL for primary prevention;   <70 mg/dL for patients with CHD or diabetic patients  with > or = 2 CHD risk factors. Marland Kitchen LDL-C is now calculated using the Martin-Hopkins  calculation, which is a validated novel method providing  better accuracy than the Friedewald equation in the  estimation of LDL-C.  Horald Pollen et al. Lenox Ahr. 3086;578(46): 2061-2068  (http://education.[facility].com/faq/FAQ164)    Total CHOL/HDL Ratio 3.2 <5.0 (calc)   Non-HDL Cholesterol (Calc) 131 (H) <130 mg/dL (calc)    Comment: For patients with diabetes plus 1 major ASCVD risk  factor, treating  to a non-HDL-C goal of <100 mg/dL  (LDL-C of <16 mg/dL) is considered a therapeutic  option.   CBC with Differential/Platelet     Status: None   Collection Time: 12/07/20  2:05 PM  Result Value Ref Range   WBC 5.4 3.8 - 10.8 Thousand/uL   RBC 4.69 4.20 - 5.80 Million/uL   Hemoglobin 13.9 13.2 - 17.1 g/dL   HCT 10.9 60.4 - 54.0 %   MCV 87.2 80.0 - 100.0 fL   MCH 29.6 27.0 - 33.0 pg   MCHC 34.0 32.0 - 36.0 g/dL   RDW 98.1 19.1 - 47.8 %   Platelets 278 140 - 400 Thousand/uL   MPV 10.3 7.5 - 12.5 fL   Neutro Abs 3,812 1,500 - 7,800 cells/uL   Lymphs Abs 1,004 850 - 3,900 cells/uL   Absolute Monocytes 524 200 - 950 cells/uL   Eosinophils Absolute 38 15 - 500 cells/uL   Basophils Absolute 22 0 - 200 cells/uL   Neutrophils Relative % 70.6 %   Total Lymphocyte 18.6 %   Monocytes Relative 9.7 %   Eosinophils Relative 0.7 %   Basophils Relative 0.4 %  COMPLETE METABOLIC PANEL WITH GFR     Status: None   Collection Time: 12/07/20  2:05 PM  Result Value Ref Range    Glucose, Bld 91 65 - 99 mg/dL    Comment: .            Fasting reference interval .    BUN 14 7 - 25 mg/dL   Creat 2.95 6.21 - 3.08 mg/dL   GFR, Est Non African American 77 > OR = 60 mL/min/1.57m2   GFR, Est African American 89 > OR = 60 mL/min/1.51m2   BUN/Creatinine Ratio NOT APPLICABLE 6 - 22 (calc)   Sodium 141 135 - 146 mmol/L   Potassium 4.0 3.5 - 5.3 mmol/L   Chloride 103 98 - 110 mmol/L   CO2 31 20 - 32 mmol/L   Calcium 9.7 8.6 - 10.3 mg/dL   Total Protein 6.9 6.1 - 8.1 g/dL   Albumin 4.9 3.6 - 5.1 g/dL   Globulin 2.0 1.9 - 3.7 g/dL (calc)   AG Ratio 2.5 1.0 - 2.5 (calc)   Total Bilirubin 0.5 0.2 - 1.2 mg/dL   Alkaline phosphatase (APISO) 59 36 - 130 U/L   AST 15 10 - 40 U/L   ALT 20 9 - 46 U/L  Vitamin B12     Status: None   Collection Time: 12/07/20  2:05 PM  Result Value Ref Range   Vitamin B-12 301 200 - 1,100 pg/mL    Comment: . Please Note: Although the reference range for vitamin B12 is 5812312006 pg/mL, it has been reported that between 5 and 10% of patients with values between 200 and 400 pg/mL may experience neuropsychiatric and hematologic abnormalities due to occult B12 deficiency; less than 1% of patients with values above 400 pg/mL will have symptoms. .   Hemoglobin A1c     Status: None   Collection Time: 12/07/20  2:05 PM  Result Value Ref Range   Hgb A1c MFr Bld 5.3 <5.7 % of total Hgb    Comment: For the purpose of screening for the presence of diabetes: . <5.7%       Consistent with the absence of diabetes 5.7-6.4%    Consistent with increased risk for diabetes             (prediabetes) > or =6.5%  Consistent with diabetes .  This assay result is consistent with a decreased risk of diabetes. . Currently, no consensus exists regarding use of hemoglobin A1c for diagnosis of diabetes in children. . According to American Diabetes Association (ADA) guidelines, hemoglobin A1c <7.0% represents optimal control in non-pregnant diabetic patients.  Different metrics may apply to specific patient populations.  Standards of Medical Care in Diabetes(ADA). .    Mean Plasma Glucose 105 mg/dL   eAG (mmol/L) 5.8 mmol/L  TSH     Status: None   Collection Time: 12/07/20  2:05 PM  Result Value Ref Range   TSH 1.21 0.40 - 4.50 mIU/L      PHQ2/9: Depression screen [facility] 2/9 12/07/2020 12/07/2020 12/07/2020 09/02/2020 06/14/2020  Decreased Interest 0 0 0 0 0  Down, Depressed, Hopeless 0 0 0 0 0  PHQ - 2 Score 0 0 0 0 0  Altered sleeping 0 - - - 0  Tired, decreased energy 1 - - - 0  Change in appetite 0 - - - 0  Feeling bad or failure about yourself  0 - - - 0  Trouble concentrating 0 - - - 0  Moving slowly or fidgety/restless 0 - - - 0  Suicidal thoughts 0 - - - 0  PHQ-9 Score 1 - - - 0  Difficult doing work/chores Not difficult at all - - - -  Some recent data might be hidden    phq 9 is negative   Fall Risk: Fall Risk  02/18/2021 12/07/2020 09/02/2020 06/14/2020 03/04/2020  Falls in the past year? 0 0 0 0 0  Number falls in past yr: 0 0 0 0 0  Injury with Fall? 0 0 0 0 0  Follow up Falls evaluation completed - Falls evaluation completed - Falls evaluation completed     Functional Status Survey: Is the patient deaf or have difficulty hearing?: No Does the patient have difficulty seeing, even when wearing glasses/contacts?: No Does the patient have difficulty concentrating, remembering, or making decisions?: No Does the patient have difficulty walking or climbing stairs?: No Does the patient have difficulty dressing or bathing?: No Does the patient have difficulty doing errands alone such as visiting a doctor's office or shopping?: No    Assessment & Plan  1. Attention deficit hyperactivity disorder (ADHD), predominantly hyperactive type  - amphetamine-dextroamphetamine (ADDERALL XR) 25 MG 24 hr capsule; Take 1 capsule by mouth every morning.  Dispense: 30 capsule; Refill: 0 - amphetamine-dextroamphetamine (ADDERALL XR) 25 MG  24 hr capsule; Take 1 capsule by mouth every morning.  Dispense: 30 capsule; Refill: 0 - amphetamine-dextroamphetamine (ADDERALL XR) 25 MG 24 hr capsule; Take 1 capsule by mouth every morning.  Dispense: 30 capsule; Refill: 0 - amphetamine-dextroamphetamine (ADDERALL) 15 MG tablet; Take 1 tablet by mouth daily. At lunch-when working long hours  Dispense: 30 tablet; Refill: 0 - amphetamine-dextroamphetamine (ADDERALL) 15 MG tablet; Take 1 tablet by mouth daily.  Dispense: 30 tablet; Refill: 0 - amphetamine-dextroamphetamine (ADDERALL) 15 MG tablet; Take 1 tablet by mouth daily. April 20 th, 2022  Dispense: 30 tablet; Refill: 0  2. GAD (generalized anxiety disorder)   3. Seasonal allergic rhinitis due to pollen  - azelastine (ASTELIN) 0.1 % nasal spray; Place 1 spray into both nostrils 2 (two) times daily. Use in each nostril as directed  Dispense: 30 mL; Refill: 2  4. Panic attack  - ALPRAZolam (XANAX) 0.5 MG tablet; Take 1 tablet (0.5 mg total) by mouth as needed for anxiety.  Dispense: 35 tablet; Refill: 0

## 2021-02-18 ENCOUNTER — Ambulatory Visit (INDEPENDENT_AMBULATORY_CARE_PROVIDER_SITE_OTHER): Payer: BC Managed Care – PPO | Admitting: Family Medicine

## 2021-02-18 ENCOUNTER — Other Ambulatory Visit: Payer: Self-pay

## 2021-02-18 ENCOUNTER — Encounter: Payer: Self-pay | Admitting: Family Medicine

## 2021-02-18 VITALS — BP 120/80 | HR 87 | Temp 98.5°F | Resp 18 | Ht 72.0 in | Wt 190.9 lb

## 2021-02-18 DIAGNOSIS — J301 Allergic rhinitis due to pollen: Secondary | ICD-10-CM | POA: Diagnosis not present

## 2021-02-18 DIAGNOSIS — F411 Generalized anxiety disorder: Secondary | ICD-10-CM | POA: Diagnosis not present

## 2021-02-18 DIAGNOSIS — F901 Attention-deficit hyperactivity disorder, predominantly hyperactive type: Secondary | ICD-10-CM

## 2021-02-18 DIAGNOSIS — F410 Panic disorder [episodic paroxysmal anxiety] without agoraphobia: Secondary | ICD-10-CM

## 2021-02-18 MED ORDER — AMPHETAMINE-DEXTROAMPHET ER 25 MG PO CP24: 25.0000 mg | ORAL_CAPSULE | ORAL | 0 refills | Status: DC

## 2021-02-18 MED ORDER — AMPHETAMINE-DEXTROAMPHETAMINE 15 MG PO TABS: 15.0000 mg | ORAL_TABLET | Freq: Every day | ORAL | 0 refills | Status: DC

## 2021-02-18 MED ORDER — AMPHETAMINE-DEXTROAMPHETAMINE 15 MG PO TABS: 1.0000 | ORAL_TABLET | Freq: Every day | ORAL | 0 refills | Status: DC

## 2021-02-18 MED ORDER — AZELASTINE HCL 0.1 % NA SOLN: 1.0000 | Freq: Two times a day (BID) | NASAL | 2 refills | Status: DC

## 2021-02-18 MED ORDER — ALPRAZOLAM 0.5 MG PO TABS: 0.5000 mg | ORAL_TABLET | ORAL | 0 refills | Status: DC | PRN

## 2021-02-28 ENCOUNTER — Encounter: Payer: Self-pay | Admitting: Cardiology

## 2021-02-28 ENCOUNTER — Other Ambulatory Visit: Payer: Self-pay

## 2021-02-28 ENCOUNTER — Ambulatory Visit: Payer: BC Managed Care – PPO | Admitting: Cardiology

## 2021-02-28 VITALS — BP 120/72 | HR 93 | Ht 73.0 in | Wt 189.0 lb

## 2021-02-28 DIAGNOSIS — I493 Ventricular premature depolarization: Secondary | ICD-10-CM

## 2021-02-28 DIAGNOSIS — R002 Palpitations: Secondary | ICD-10-CM

## 2021-02-28 MED ORDER — METOPROLOL SUCCINATE ER 25 MG PO TB24: 25.0000 mg | ORAL_TABLET | Freq: Every day | ORAL | 5 refills | Status: DC

## 2021-02-28 NOTE — Progress Notes (Signed)
Cardiology Office Note:    Date:  02/28/2021   ID:  William Rivers, DOB 1991-10-25, MRN [id number]  PCP:  Alba Cory, MD   [facility]  Cardiologist:  Debbe Odea, MD  Advanced Practice Provider:  No care team member to display Electrophysiologist:  None       Referring MD: Alba Cory, MD   Chief Complaint  Patient presents with  . Other    6 week follow up post ZIO. Meds reviewed verbally with patient.      History of Present Illness:    William Rivers is a 30 y.o. male with a hx of ADHD, anxiety who presents for follow-up.  He was previously seen due to irregular heartbeats and skipped heartbeats.  Cardiac monitor was placed to evaluate any significant arrhythmias.  He presents for follow-up after cardiac monitor. He still has occasional skipped heartbeats, associated with some dizziness sometimes. He denies chest pain or shortness of breath at rest or with exertion.  Past Medical History:  Diagnosis Date  . ADD (attention deficit disorder)   . Allergy   . Anxiety   . Back pain     Past Surgical History:  Procedure Laterality Date  . TONSILLECTOMY    . VASECTOMY Bilateral 02/21/2018    Current Medications: Current Meds  Medication Sig  . ALPRAZolam (XANAX) 0.5 MG tablet Take 1 tablet (0.5 mg total) by mouth as needed for anxiety.  Marland Kitchen amphetamine-dextroamphetamine (ADDERALL XR) 25 MG 24 hr capsule Take 1 capsule by mouth every morning.  Marland Kitchen amphetamine-dextroamphetamine (ADDERALL XR) 25 MG 24 hr capsule Take 1 capsule by mouth every morning.  Marland Kitchen amphetamine-dextroamphetamine (ADDERALL XR) 25 MG 24 hr capsule Take 1 capsule by mouth every morning.  Marland Kitchen amphetamine-dextroamphetamine (ADDERALL) 15 MG tablet Take 1 tablet by mouth daily. At lunch-when working long hours  . amphetamine-dextroamphetamine (ADDERALL) 15 MG tablet Take 1 tablet by mouth daily.  Marland Kitchen amphetamine-dextroamphetamine (ADDERALL) 15 MG tablet Take 1 tablet by mouth daily.  April 20 th, 2022  . azelastine (ASTELIN) 0.1 % nasal spray Place 1 spray into both nostrils 2 (two) times daily. Use in each nostril as directed  . cyclobenzaprine (FLEXERIL) 10 MG tablet Take 0.5-1 tablets (5-10 mg total) by mouth at bedtime.  . metoprolol succinate (TOPROL XL) 25 MG 24 hr tablet Take 1 tablet (25 mg total) by mouth daily.     Allergies:   Patient has no known allergies.   Social History   Socioeconomic History  . Marital status: Married    Spouse name: Ave Filter   . Number of children: 3  . Years of education: Not on file  . Highest education level: 12th grade  Occupational History  . Occupation: Personnel officer   Tobacco Use  . Smoking status: Never Smoker  . Smokeless tobacco: Current User    Types: Chew  Vaping Use  . Vaping Use: Never used  Substance and Sexual Activity  . Alcohol use: Yes    Alcohol/week: 18.0 standard drinks    Types: 6 Cans of beer, 12 Standard drinks or equivalent per week  . Drug use: No  . Sexual activity: Yes    Partners: Female    Birth control/protection: I.U.D.  Other Topics Concern  . Not on file  Social History Narrative   Living with wife  - Ave Filter she is mother of one of his kids ( son)    He has two older children from previous relationship.    Social Determinants of Health  Financial Resource Strain: Low Risk   . Difficulty of Paying Living Expenses: Not very hard  Food Insecurity: No Food Insecurity  . Worried About Programme researcher, broadcasting/film/video in the Last Year: Never true  . Ran Out of Food in the Last Year: Never true  Transportation Needs: No Transportation Needs  . Lack of Transportation (Medical): No  . Lack of Transportation (Non-Medical): No  Physical Activity: Insufficiently Active  . Days of Exercise per Week: 7 days  . Minutes of Exercise per Session: 10 min  Stress: Stress Concern Present  . Feeling of Stress : To some extent  Social Connections: Moderately Isolated  . Frequency of Communication with  Friends and Family: Three times a week  . Frequency of Social Gatherings with Friends and Family: Twice a week  . Attends Religious Services: Never  . Active Member of Clubs or Organizations: No  . Attends Banker Meetings: Never  . Marital Status: Married     Family History: The patient's family history includes Anxiety disorder in his mother.  ROS:   Please see the history of present illness.     All other systems reviewed and are negative.  EKGs/Labs/Other Studies Reviewed:    The following studies were reviewed today:   EKG:  EKG is  ordered today.  The ekg ordered today demonstrates normal sinus rhythm, normal ECG  Recent Labs: 12/07/2020: ALT 20; BUN 14; Creat 1.24; Hemoglobin 13.9; Platelets 278; Potassium 4.0; Sodium 141; TSH 1.21  Recent Lipid Panel    Component Value Date/Time   CHOL 190 12/07/2020 1405   TRIG 67 12/07/2020 1405   HDL 59 12/07/2020 1405   CHOLHDL 3.2 12/07/2020 1405   LDLCALC 115 (H) 12/07/2020 1405     Risk Assessment/Calculations:      Physical Exam:    VS:  BP 120/72 (BP Location: Left Arm, Patient Position: Sitting, Cuff Size: Normal)   Pulse 93   Ht 6\' 1"  (1.854 m)   Wt 189 lb (85.7 kg)   SpO2 96%   BMI 24.94 kg/m     Wt Readings from Last 3 Encounters:  02/28/21 189 lb (85.7 kg)  02/18/21 190 lb 14.4 oz (86.6 kg)  01/21/21 193 lb (87.5 kg)     GEN:  Well nourished, well developed in no acute distress HEENT: Normal NECK: No JVD; No carotid bruits LYMPHATICS: No lymphadenopathy CARDIAC: RRR, no murmurs, rubs, gallops RESPIRATORY:  Clear to auscultation without rales, wheezing or rhonchi  ABDOMEN: Soft, non-tender, non-distended MUSCULOSKELETAL:  No edema; No deformity  SKIN: Warm and dry NEUROLOGIC:  Alert and oriented x 3 PSYCHIATRIC:  Normal affect   ASSESSMENT:    1. Palpitations   2. PVC (premature ventricular contraction)    PLAN:    In order of problems listed above:  1. Patient with  irregular/skipped heartbeats.  Cardiac monitor did not show any significant arrhythmias, patient triggered events associated with PVCs which were rare.  Use of Adderall possibly contributing to symptoms.  We will start a trial of Toprol-XL 25 mg daily in an attempt to suppress PVCs and patient's symptoms.   Follow-up in 6 weeks.    Medication Adjustments/Labs and Tests Ordered: Current medicines are reviewed at length with the patient today.  Concerns regarding medicines are outlined above.  Orders Placed This Encounter  Procedures  . EKG 12-Lead   Meds ordered this encounter  Medications  . metoprolol succinate (TOPROL XL) 25 MG 24 hr tablet    Sig: Take 1  tablet (25 mg total) by mouth daily.    Dispense:  30 tablet    Refill:  5    Patient Instructions  Medication Instructions:  Your physician has recommended you make the following change in your medication:   1.  START taking Toprol XL (Metoprolol Succinate) 25 MG daily.   *If you need a refill on your cardiac medications before your next appointment, please call your pharmacy*   Lab Work: None ordered If you have labs (blood work) drawn today and your tests are completely normal, you will receive your results only by: Marland Kitchen MyChart Message (if you have MyChart) OR . A paper copy in the mail If you have any lab test that is abnormal or we need to change your treatment, we will call you to review the results.   Testing/Procedures: None ordered   Follow-Up: At [facility], you and your health needs are our priority.  As part of our continuing mission to provide you with exceptional heart care, we have created designated Provider Care Teams.  These Care Teams include your primary Cardiologist (physician) and Advanced Practice Providers (APPs -  Physician Assistants and Nurse Practitioners) who all work together to provide you with the care you need, when you need it.  We recommend signing up for the patient portal called  "MyChart".  Sign up information is provided on this After Visit Summary.  MyChart is used to connect with patients for Virtual Visits (Telemedicine).  Patients are able to view lab/test results, encounter notes, upcoming appointments, etc.  Non-urgent messages can be sent to your provider as well.   To learn more about what you can do with MyChart, go to [email or web address].    Your next appointment:   6 week(s)  The format for your next appointment:   In Person  Provider:   Debbe Odea, MD   Other Instructions     Signed, Debbe Odea, MD  02/28/2021 5:01 PM    [facility]

## 2021-02-28 NOTE — Patient Instructions (Signed)
Medication Instructions:  Your physician has recommended you make the following change in your medication:   1.  START taking Toprol XL (Metoprolol Succinate) 25 MG daily.   *If you need a refill on your cardiac medications before your next appointment, please call your pharmacy*   Lab Work: None ordered If you have labs (blood work) drawn today and your tests are completely normal, you will receive your results only by: Marland Kitchen MyChart Message (if you have MyChart) OR . A paper copy in the mail If you have any lab test that is abnormal or we need to change your treatment, we will call you to review the results.   Testing/Procedures: None ordered   Follow-Up: At [facility], you and your health needs are our priority.  As part of our continuing mission to provide you with exceptional heart care, we have created designated Provider Care Teams.  These Care Teams include your primary Cardiologist (physician) and Advanced Practice Providers (APPs -  Physician Assistants and Nurse Practitioners) who all work together to provide you with the care you need, when you need it.  We recommend signing up for the patient portal called "MyChart".  Sign up information is provided on this After Visit Summary.  MyChart is used to connect with patients for Virtual Visits (Telemedicine).  Patients are able to view lab/test results, encounter notes, upcoming appointments, etc.  Non-urgent messages can be sent to your provider as well.   To learn more about what you can do with MyChart, go to [email or web address].    Your next appointment:   6 week(s)  The format for your next appointment:   In Person  Provider:   Debbe Odea, MD   Other Instructions

## 2021-04-15 ENCOUNTER — Ambulatory Visit: Payer: BC Managed Care – PPO | Admitting: Cardiology

## 2021-05-19 NOTE — Progress Notes (Signed)
Name: William Rivers   MRN: [id number]    DOB: [birth year]   Date:05/20/2021       Progress Note  Subjective  Chief Complaint  Follow  Up  HPI  ADHD: he is taking medication as prescribed, denies side effects. He only started having palpitations over the past year, but has been on current dose of Adderal for many years. He needs a refill. He is aware it is controlled medication and not to share.    Palpitation: he developed symptoms about one year ago, he decreased caffeine intake and symptoms resolved, symptoms increased again earlier in 2022 we referred him to cardiology, he worse a holter monitor that showed PVC's he was given metoprolol but stopped taking it after a few days due to side effects " I didn't feel right", he is doing better now    Anxiety: . 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 He went 66 days working without a break/takes side jobs, he also states try to stay busy so his mind doesn't wander, explained importance of therapy    Varicose veins: brother and grandfather have same problems. He states he has noticed leg heaviness and the bulging is worse at the end of the day. He denies lower extremity edema. He stopped wearing compression stocking hoses and has not been using his pump lately    Chronic low back pain: pain is described as constant and dull, usually at the end of the day, feels more stiff than painful, he states stable. Used to have radiculitis but not recently , he stopped taking gabapentin,  but takes flexeril prn Unchanged   AR: he states he has nasal congestion and a sinus infection this time of the year, he takes otc medication. We will send Astelin to pharmacy to take prn . Unchanged   Vitamin B12: he has been taking otc B12 but not consistently , explained importance of taking it   Patient Active Problem List   Diagnosis Date Noted   Arm laceration, right, initial encounter 09/13/2020   ADD  (attention deficit disorder) 05/29/2015   Chronic LBP 05/29/2015   Lactose intolerance 05/29/2015   Panic attack 05/29/2015   Perennial allergic rhinitis with seasonal variation 05/29/2015   Lumbosacral radiculitis 05/29/2015    Past Surgical History:  Procedure Laterality Date   TONSILLECTOMY     VASECTOMY Bilateral 02/21/2018    Family History  Problem Relation Age of Onset   Anxiety disorder Mother     Social History   Tobacco Use   Smoking status: Never   Smokeless tobacco: Current    Types: Chew  Substance Use Topics   Alcohol use: Yes    Alcohol/week: 18.0 standard drinks    Types: 6 Cans of beer, 12 Standard drinks or equivalent per week     Current Outpatient Medications:    ALPRAZolam (XANAX) 0.5 MG tablet, Take 1 tablet (0.5 mg total) by mouth as needed for anxiety., Disp: 35 tablet, Rfl: 0   amphetamine-dextroamphetamine (ADDERALL XR) 25 MG 24 hr capsule, Take 1 capsule by mouth every morning., Disp: 30 capsule, Rfl: 0   azelastine (ASTELIN) 0.1 % nasal spray, Place 1 spray into both nostrils 2 (two) times daily. Use in each nostril as directed, Disp: 30 mL, Rfl: 2   cyclobenzaprine (FLEXERIL) 10 MG tablet, Take 0.5-1 tablets (5-10 mg total) by mouth at bedtime., Disp: 90 tablet, Rfl: 0   metoprolol succinate (  TOPROL XL) 25 MG 24 hr tablet, Take 1 tablet (25 mg total) by mouth daily., Disp: 30 tablet, Rfl: 5   amphetamine-dextroamphetamine (ADDERALL XR) 25 MG 24 hr capsule, Take 1 capsule by mouth every morning., Disp: 30 capsule, Rfl: 0   amphetamine-dextroamphetamine (ADDERALL XR) 25 MG 24 hr capsule, Take 1 capsule by mouth every morning., Disp: 30 capsule, Rfl: 0   amphetamine-dextroamphetamine (ADDERALL) 15 MG tablet, Take 1 tablet by mouth daily. At lunch-when working long hours, Disp: 30 tablet, Rfl: 0   amphetamine-dextroamphetamine (ADDERALL) 15 MG tablet, Take 1 tablet by mouth daily., Disp: 30 tablet, Rfl: 0   amphetamine-dextroamphetamine (ADDERALL) 15  MG tablet, Take 1 tablet by mouth daily. April 20 th, 2022, Disp: 30 tablet, Rfl: 0  No Known Allergies  I personally reviewed active problem list, medication list, allergies, family history, social history, health maintenance with the patient/caregiver today.   ROS  Constitutional: Negative for fever or weight change.  Respiratory: Negative for cough and shortness of breath.   Cardiovascular: Negative for chest pain or palpitations.  Gastrointestinal: Negative for abdominal pain, no bowel changes.  Musculoskeletal: Negative for gait problem or joint swelling.  Skin: Negative for rash.  Neurological: Negative for dizziness or headache.  No other specific complaints in a complete review of systems (except as listed in HPI above).   Objective  Vitals:   05/20/21 1309  BP: 124/72  Pulse: 96  Resp: 18  Temp: 98.2 F (36.8 C)  TempSrc: Oral  SpO2: 98%  Weight: 192 lb 4.8 oz (87.2 kg)  Height: 6\' 1"  (1.854 m)    Body mass index is 25.37 kg/m.  Physical Exam  Constitutional: Patient appears well-developed and well-nourished. No distress.  HEENT: head atraumatic, normocephalic, pupils equal and reactive to light, neck supple Cardiovascular: Normal rate, regular rhythm and normal heart sounds.  No murmur heard. No BLE edema. Pulmonary/Chest: Effort normal and breath sounds normal. No respiratory distress. Abdominal: Soft.  There is no tenderness. Psychiatric: Patient has a normal mood and affect. behavior is normal. Judgment and thought content normal.   PHQ2/9: Depression screen [facility] 2/9 05/20/2021 02/18/2021 12/07/2020 12/07/2020 12/07/2020  Decreased Interest 0 0 0 0 0  Down, Depressed, Hopeless 0 0 0 0 0  PHQ - 2 Score 0 0 0 0 0  Altered sleeping 0 - 0 - -  Tired, decreased energy 0 - 1 - -  Change in appetite 0 - 0 - -  Feeling bad or failure about yourself  0 - 0 - -  Trouble concentrating 0 - 0 - -  Moving slowly or fidgety/restless 0 - 0 - -  Suicidal thoughts 0 - 0 - -   PHQ-9 Score 0 - 1 - -  Difficult doing work/chores Not difficult at all - Not difficult at all - -  Some recent data might be hidden    phq 9 is negative   Fall Risk: Fall Risk  05/20/2021 02/18/2021 12/07/2020 09/02/2020 06/14/2020  Falls in the past year? 0 0 0 0 0  Number falls in past yr: 0 0 0 0 0  Injury with Fall? 0 0 0 0 0  Follow up Falls evaluation completed Falls evaluation completed - Falls evaluation completed -      Functional Status Survey: Is the patient deaf or have difficulty hearing?: No Does the patient have difficulty seeing, even when wearing glasses/contacts?: No Does the patient have difficulty concentrating, remembering, or making decisions?: No Does the patient have difficulty walking  or climbing stairs?: No Does the patient have difficulty dressing or bathing?: No Does the patient have difficulty doing errands alone such as visiting a doctor's office or shopping?: No    Assessment & Plan  1. Panic attack  - ALPRAZolam (XANAX) 0.5 MG tablet; Take 1 tablet (0.5 mg total) by mouth as needed for anxiety.  Dispense: 35 tablet; Refill: 0  2. Attention deficit hyperactivity disorder (ADHD), predominantly hyperactive type  - amphetamine-dextroamphetamine (ADDERALL XR) 25 MG 24 hr capsule; Take 1 capsule by mouth every morning.  Dispense: 30 capsule; Refill: 0 - amphetamine-dextroamphetamine (ADDERALL XR) 25 MG 24 hr capsule; Take 1 capsule by mouth every morning.  Dispense: 30 capsule; Refill: 0 - amphetamine-dextroamphetamine (ADDERALL XR) 25 MG 24 hr capsule; Take 1 capsule by mouth every morning.  Dispense: 30 capsule; Refill: 0 - amphetamine-dextroamphetamine (ADDERALL) 15 MG tablet; Take 1 tablet by mouth daily. At lunch-when working long hours  Dispense: 30 tablet; Refill: 0 - amphetamine-dextroamphetamine (ADDERALL) 15 MG tablet; Take 1 tablet by mouth daily.  Dispense: 30 tablet; Refill: 0 - amphetamine-dextroamphetamine (ADDERALL) 15 MG tablet; Take 1  tablet by mouth daily.  Dispense: 30 tablet; Refill: 0  3. GAD (generalized anxiety disorder)  Stable   4. B12 deficiency  Continue B12 SL  5. Palpitation   6. PVC (premature ventricular contraction)

## 2021-05-20 ENCOUNTER — Ambulatory Visit: Payer: BC Managed Care – PPO | Admitting: Family Medicine

## 2021-05-20 ENCOUNTER — Encounter: Payer: Self-pay | Admitting: Family Medicine

## 2021-05-20 ENCOUNTER — Other Ambulatory Visit: Payer: Self-pay

## 2021-05-20 VITALS — BP 124/72 | HR 96 | Temp 98.2°F | Resp 18 | Ht 73.0 in | Wt 192.3 lb

## 2021-05-20 DIAGNOSIS — R002 Palpitations: Secondary | ICD-10-CM

## 2021-05-20 DIAGNOSIS — F410 Panic disorder [episodic paroxysmal anxiety] without agoraphobia: Secondary | ICD-10-CM | POA: Diagnosis not present

## 2021-05-20 DIAGNOSIS — F411 Generalized anxiety disorder: Secondary | ICD-10-CM

## 2021-05-20 DIAGNOSIS — E538 Deficiency of other specified B group vitamins: Secondary | ICD-10-CM

## 2021-05-20 DIAGNOSIS — I493 Ventricular premature depolarization: Secondary | ICD-10-CM

## 2021-05-20 DIAGNOSIS — F901 Attention-deficit hyperactivity disorder, predominantly hyperactive type: Secondary | ICD-10-CM | POA: Diagnosis not present

## 2021-05-20 MED ORDER — ALPRAZOLAM 0.5 MG PO TABS
0.5000 mg | ORAL_TABLET | ORAL | 0 refills | Status: DC | PRN
Start: 2021-05-20 — End: 2021-08-24

## 2021-05-20 MED ORDER — AMPHETAMINE-DEXTROAMPHETAMINE 15 MG PO TABS: 1.0000 | ORAL_TABLET | Freq: Every day | ORAL | 0 refills | Status: DC

## 2021-05-20 MED ORDER — AMPHETAMINE-DEXTROAMPHET ER 25 MG PO CP24: 25.0000 mg | ORAL_CAPSULE | ORAL | 0 refills | Status: DC

## 2021-05-20 MED ORDER — AMPHETAMINE-DEXTROAMPHETAMINE 15 MG PO TABS: 15.0000 mg | ORAL_TABLET | Freq: Every day | ORAL | 0 refills | Status: DC

## 2021-08-04 ENCOUNTER — Telehealth: Payer: Self-pay

## 2021-08-04 NOTE — Telephone Encounter (Signed)
Per Dr. Carlynn Purl, I can call pharmacy to have rx filled early due to pt always being compliant and having a f/u scheduled for 08/24/21.

## 2021-08-04 NOTE — Telephone Encounter (Signed)
Pt is requesting to have the adderrell called in early due to going out of town and he needs this done by 08/11/21. [facility] [place]

## 2021-08-24 ENCOUNTER — Other Ambulatory Visit: Payer: Self-pay

## 2021-08-24 ENCOUNTER — Ambulatory Visit: Payer: BC Managed Care – PPO | Admitting: Family Medicine

## 2021-08-24 ENCOUNTER — Encounter: Payer: Self-pay | Admitting: Family Medicine

## 2021-08-24 VITALS — BP 118/76 | HR 91 | Temp 98.5°F | Resp 16 | Ht 73.0 in | Wt 194.0 lb

## 2021-08-24 DIAGNOSIS — E538 Deficiency of other specified B group vitamins: Secondary | ICD-10-CM

## 2021-08-24 DIAGNOSIS — J301 Allergic rhinitis due to pollen: Secondary | ICD-10-CM

## 2021-08-24 DIAGNOSIS — G8929 Other chronic pain: Secondary | ICD-10-CM

## 2021-08-24 DIAGNOSIS — F410 Panic disorder [episodic paroxysmal anxiety] without agoraphobia: Secondary | ICD-10-CM

## 2021-08-24 DIAGNOSIS — F901 Attention-deficit hyperactivity disorder, predominantly hyperactive type: Secondary | ICD-10-CM | POA: Diagnosis not present

## 2021-08-24 DIAGNOSIS — M545 Low back pain, unspecified: Secondary | ICD-10-CM

## 2021-08-24 MED ORDER — AMPHETAMINE-DEXTROAMPHET ER 25 MG PO CP24: 25.0000 mg | ORAL_CAPSULE | ORAL | 0 refills | Status: DC

## 2021-08-24 MED ORDER — AMPHETAMINE-DEXTROAMPHETAMINE 15 MG PO TABS: 1.0000 | ORAL_TABLET | Freq: Every day | ORAL | 0 refills | Status: DC

## 2021-08-24 MED ORDER — AZELASTINE HCL 0.1 % NA SOLN: 1.0000 | Freq: Two times a day (BID) | NASAL | 2 refills | Status: DC

## 2021-08-24 MED ORDER — AMPHETAMINE-DEXTROAMPHETAMINE 15 MG PO TABS: 15.0000 mg | ORAL_TABLET | Freq: Every day | ORAL | 0 refills | Status: DC

## 2021-08-24 MED ORDER — ALPRAZOLAM 0.5 MG PO TABS: 0.5000 mg | ORAL_TABLET | ORAL | 0 refills | Status: DC | PRN

## 2021-08-24 NOTE — Progress Notes (Signed)
Name: William Rivers   MRN: [id number]    DOB: 1991-03-24   Date:08/24/2021       Progress Note  Subjective  Chief Complaint  Follow Up  HPI  ADHD: he is taking medication as prescribed The medication helps him focus and also calms his mind. He denies side effects of medication.He is aware that Adderal is a controlled medication and he cannot share, sale or misuse .    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 He is stable and does not want to see a therapist    Varicose veins: brother and grandfather have same problems. He states he has noticed leg heaviness and the bulging is worse at the end of the day. He denies lower extremity edema. He wears compression stocking hoses occasionally and symptoms are stable.    Chronic low back pain: pain is described as constant and dull, usually at the end of the day, feels more stiff than painful, he states stable. Used to have radiculitis but not recently , he stopped taking gabapentin,  but takes flexeril prn He still has medications at home   AR: he is using Astelin prn and is doing well   Vitamin B12: he has been taking otc B12 but not consistently ,. Unchanged   Patient Active Problem List   Diagnosis Date Noted   Arm laceration, right, initial encounter 09/13/2020   ADD (attention deficit disorder) 05/29/2015   Chronic LBP 05/29/2015   Lactose intolerance 05/29/2015   Panic attack 05/29/2015   Perennial allergic rhinitis with seasonal variation 05/29/2015   Lumbosacral radiculitis  05/29/2015    Past Surgical History:  Procedure Laterality Date   TONSILLECTOMY     VASECTOMY Bilateral 02/21/2018    Family History  Problem Relation Age of Onset   Anxiety disorder Mother     Social History   Tobacco Use   Smoking status: Never   Smokeless tobacco: Current    Types: Chew  Substance Use Topics   Alcohol use: Yes    Alcohol/week: 18.0 standard drinks    Types: 6 Cans of beer, 12 Standard drinks or equivalent per week     Current Outpatient Medications:    ALPRAZolam (XANAX) 0.5 MG tablet, Take 1 tablet (0.5 mg total) by mouth as needed for anxiety., Disp: 35 tablet, Rfl: 0   amphetamine-dextroamphetamine (ADDERALL XR) 25 MG 24 hr capsule, Take 1 capsule by mouth every morning., Disp: 30 capsule, Rfl: 0   amphetamine-dextroamphetamine (ADDERALL XR) 25 MG 24 hr capsule, Take 1 capsule by mouth every morning., Disp: 30 capsule, Rfl: 0   amphetamine-dextroamphetamine (ADDERALL XR) 25 MG 24 hr capsule, Take 1 capsule by mouth every morning., Disp: 30 capsule, Rfl: 0   amphetamine-dextroamphetamine (ADDERALL) 15 MG tablet, Take 1 tablet by mouth daily. At lunch-when working long hours, Disp: 30 tablet, Rfl: 0   amphetamine-dextroamphetamine (ADDERALL) 15 MG tablet, Take 1 tablet by mouth daily., Disp: 30 tablet, Rfl: 0   amphetamine-dextroamphetamine (ADDERALL) 15 MG tablet, Take 1 tablet by mouth daily.,  Disp: 30 tablet, Rfl: 0   azelastine (ASTELIN) 0.1 % nasal spray, Place 1 spray into both nostrils 2 (two) times daily. Use in each nostril as directed, Disp: 30 mL, Rfl: 2   cyclobenzaprine (FLEXERIL) 10 MG tablet, Take 0.5-1 tablets (5-10 mg total) by mouth at bedtime., Disp: 90 tablet, Rfl: 0  No Known Allergies  I personally reviewed active problem list, medication list, allergies, family history, social history, health maintenance with the patient/caregiver today.   ROS  Constitutional: Negative for fever or weight change.  Respiratory: Negative for  cough and shortness of breath.   Cardiovascular: Negative for chest pain or palpitations.  Gastrointestinal: Negative for abdominal pain, no bowel changes.  Musculoskeletal: Negative for gait problem or joint swelling.  Skin: Negative for rash.  Neurological: Negative for dizziness or headache.  No other specific complaints in a complete review of systems (except as listed in HPI above).   Objective  Vitals:   08/24/21 1414  BP: 118/76  Pulse: 91  Resp: 16  Temp: 98.5 F (36.9 C)  SpO2: 98%  Weight: 194 lb (88 kg)  Height: 6\' 1"  (1.854 m)    Body mass index is 25.6 kg/m.  Physical Exam  Constitutional: Patient appears well-developed and well-nourished.  No distress.  HEENT: head atraumatic, normocephalic, pupils equal and reactive to light, neck supple Cardiovascular: Normal rate, regular rhythm and normal heart sounds.  No murmur heard. No BLE edema. Pulmonary/Chest: Effort normal and breath sounds normal. No respiratory distress. Abdominal: Soft.  There is no tenderness. Psychiatric: Patient has a normal mood and affect. behavior is normal. Judgment and thought content normal.   PHQ2/9: Depression screen [facility] 2/9 08/24/2021 05/20/2021 02/18/2021 12/07/2020 12/07/2020  Decreased Interest 0 0 0 0 0  Down, Depressed, Hopeless 0 0 0 0 0  PHQ - 2 Score 0 0 0 0 0  Altered sleeping - 0 - 0 -  Tired, decreased energy - 0 - 1 -  Change in appetite - 0 - 0 -  Feeling bad or failure about yourself  - 0 - 0 -  Trouble concentrating - 0 - 0 -  Moving slowly or fidgety/restless - 0 - 0 -  Suicidal thoughts - 0 - 0 -  PHQ-9 Score - 0 - 1 -  Difficult doing work/chores - Not difficult at all - Not difficult at all -  Some recent data might be hidden    phq 9 is negative   Fall Risk: Fall Risk  08/24/2021 05/20/2021 02/18/2021 12/07/2020 09/02/2020  Falls in the past year? 0 0 0 0 0  Number falls in past yr: 0 0 0 0 0  Injury with Fall? 0 0 0 0 0  Risk for fall due to : No Fall Risks - -  - -  Follow up Falls prevention discussed Falls evaluation completed Falls evaluation completed - Falls evaluation completed      Functional Status Survey: Is the patient deaf or have difficulty hearing?: No Does the patient have difficulty seeing, even when wearing glasses/contacts?: No Does the patient have difficulty concentrating, remembering, or making decisions?: No Does the patient have difficulty walking or climbing stairs?: No Does the patient have difficulty dressing or bathing?: No Does the patient have difficulty doing errands alone such as visiting a doctor's office or shopping?: No    Assessment & Plan  1. Attention deficit hyperactivity disorder (ADHD), predominantly hyperactive type  - amphetamine-dextroamphetamine (ADDERALL XR) 25 MG 24 hr capsule; Take 1 capsule by  mouth every morning.  Dispense: 30 capsule; Refill: 0 - amphetamine-dextroamphetamine (ADDERALL XR) 25 MG 24 hr capsule; Take 1 capsule by mouth every morning.  Dispense: 30 capsule; Refill: 0 - amphetamine-dextroamphetamine (ADDERALL XR) 25 MG 24 hr capsule; Take 1 capsule by mouth every morning.  Dispense: 30 capsule; Refill: 0 - amphetamine-dextroamphetamine (ADDERALL) 15 MG tablet; Take 1 tablet by mouth daily. At lunch-when working long hours  Dispense: 30 tablet; Refill: 0 - amphetamine-dextroamphetamine (ADDERALL) 15 MG tablet; Take 1 tablet by mouth daily.  Dispense: 30 tablet; Refill: 0 - amphetamine-dextroamphetamine (ADDERALL) 15 MG tablet; Take 1 tablet by mouth daily.  Dispense: 30 tablet; Refill: 0  2. Panic attack  - ALPRAZolam (XANAX) 0.5 MG tablet; Take 1 tablet (0.5 mg total) by mouth as needed for anxiety.  Dispense: 35 tablet; Refill: 0  3. Seasonal allergic rhinitis due to pollen  - azelastine (ASTELIN) 0.1 % nasal spray; Place 1 spray into both nostrils 2 (two) times daily. Use in each nostril as directed  Dispense: 30 mL; Refill: 2  4. B12 deficiency  He needs to resume SL  B12  5. Chronic bilateral low back pain without sciatica   Stable

## 2021-11-11 NOTE — Progress Notes (Signed)
Name: William Rivers   MRN: [id number]    DOB: 10-03-1991   Date:11/15/2021       Progress Note  Subjective  Chief Complaint  Follow Up  HPI  ADHD: he is taking medication as prescribed The medication helps him focus and also calms his mind. He denies side effects of medication.He is aware that Adderal is a controlled medication and he cannot share, sale or misuse . He needs refills sent to pharmacy    Palpitation: he developed symptoms in 2021  he decreased caffeine intake and symptoms resolved, symptoms increased again earlier in 2022 we referred him to cardiology, he wore a holter monitor that showed PVC's he was given metoprolol but stopped taking it after a few days due to side effects " I didn't feel right", episodes are intermittent and not associated with chest pain or SOB. 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 He is stable and does not want to see a therapist . He went a few weeks without taking medications back in Nov, however had one week in December that he was having daily panic attacks, he is back to his baseline now.    Varicose veins: brother and grandfather have same problems. He states he has noticed leg heaviness and the bulging is worse at the end of the day. He denies lower extremity edema. He wears compression stocking hoses occasionally . Symptoms not a problem lately, seems to be worse during hot months    Chronic low back pain: pain is described as constant and dull, usually at the end of the day, feels more stiff than painful, he states stable. Used to have radiculitis but not recently , he stopped taking gabapentin,  but takes flexeril prn and rx will expire in January we will send a refill. Taking it at most twice per week.   AR: he is using Astelin prn , doing well at this time   Vitamin B12: he has not been taking supplements lately, advised to try  to take it a few times a week.   Patient Active Problem List   Diagnosis Date Noted   Arm laceration, right, initial encounter 09/13/2020   ADD (attention deficit disorder) 05/29/2015   Chronic LBP 05/29/2015   Lactose intolerance 05/29/2015   Panic attack 05/29/2015   Perennial allergic rhinitis with seasonal variation 05/29/2015   Lumbosacral radiculitis 05/29/2015    Past Surgical History:  Procedure Laterality Date   TONSILLECTOMY     VASECTOMY Bilateral 02/21/2018    Family History  Problem Relation Age of Onset   Anxiety disorder Mother     Social History   Tobacco Use   Smoking status: Never   Smokeless tobacco: Current    Types: Chew  Substance Use Topics   Alcohol use: Yes    Alcohol/week: 18.0 standard drinks    Types: 6 Cans of beer, 12 Standard drinks or equivalent per week     Current Outpatient Medications:    ALPRAZolam (XANAX) 0.5 MG tablet, Take 1 tablet (0.5 mg total) by mouth as needed for anxiety., Disp: 35 tablet, Rfl: 0   amphetamine-dextroamphetamine (ADDERALL XR) 25 MG 24 hr capsule, Take 1 capsule by mouth every morning., Disp: 30 capsule, Rfl: 0   amphetamine-dextroamphetamine (ADDERALL XR) 25 MG 24 hr capsule, Take 1 capsule by mouth every morning., Disp: 30 capsule, Rfl: 0  amphetamine-dextroamphetamine (ADDERALL XR) 25 MG 24 hr capsule, Take 1 capsule by mouth every morning., Disp: 30 capsule, Rfl: 0   amphetamine-dextroamphetamine (ADDERALL) 15 MG tablet, Take 1 tablet by mouth daily. At lunch-when working long hours, Disp: 30 tablet, Rfl: 0   amphetamine-dextroamphetamine (ADDERALL) 15 MG tablet, Take 1 tablet by mouth daily., Disp: 30 tablet, Rfl: 0   amphetamine-dextroamphetamine (ADDERALL) 15 MG tablet, Take 1 tablet by mouth daily., Disp: 30 tablet, Rfl: 0   azelastine (ASTELIN) 0.1 % nasal spray, Place 1 spray into both nostrils 2 (two) times daily. Use in each nostril as directed, Disp: 30 mL, Rfl: 2   cyclobenzaprine (FLEXERIL) 10  MG tablet, Take 0.5-1 tablets (5-10 mg total) by mouth at bedtime., Disp: 90 tablet, Rfl: 0  No Known Allergies  I personally reviewed active problem list, medication list, allergies, family history, social history, health maintenance with the patient/caregiver today.   ROS  Constitutional: Negative for fever or weight change.  Respiratory: Negative for cough and shortness of breath.   Cardiovascular: Negative for chest pain , positive for intermittent palpitations.  Gastrointestinal: Negative for abdominal pain, no bowel changes.  Musculoskeletal: Negative for gait problem or joint swelling.  Skin: Negative for rash.  Neurological: Negative for dizziness or headache.  No other specific complaints in a complete review of systems (except as listed in HPI above).   Objective  Vitals:   11/15/21 1430  BP: 134/82  Pulse: 97  Resp: 16  Temp: 98.3 F (36.8 C)  SpO2: 99%  Weight: 201 lb (91.2 kg)  Height: 6\' 1"  (1.854 m)    Body mass index is 26.52 kg/m.  Physical Exam  Constitutional: Patient appears well-developed and well-nourished. Overweight.  No distress.  HEENT: head atraumatic, normocephalic, pupils equal and reactive to light, neck supple, Cardiovascular: Normal rate, regular rhythm and normal heart sounds.  No murmur heard. No BLE edema. Pulmonary/Chest: Effort normal and breath sounds normal. No respiratory distress. Abdominal: Soft.  There is no tenderness. Psychiatric: Patient has a normal mood and affect. behavior is normal. Judgment and thought content normal.   PHQ2/9: Depression screen [facility] 2/9 11/15/2021 08/24/2021 05/20/2021 02/18/2021 12/07/2020  Decreased Interest 0 0 0 0 0  Down, Depressed, Hopeless 0 0 0 0 0  PHQ - 2 Score 0 0 0 0 0  Altered sleeping 0 - 0 - 0  Tired, decreased energy 0 - 0 - 1  Change in appetite 0 - 0 - 0  Feeling bad or failure about yourself  0 - 0 - 0  Trouble concentrating 0 - 0 - 0  Moving slowly or fidgety/restless 0 - 0 - 0   Suicidal thoughts 0 - 0 - 0  PHQ-9 Score 0 - 0 - 1  Difficult doing work/chores - - Not difficult at all - Not difficult at all  Some recent data might be hidden    phq 9 is negative   Fall Risk: Fall Risk  11/15/2021 08/24/2021 05/20/2021 02/18/2021 12/07/2020  Falls in the past year? 0 0 0 0 0  Number falls in past yr: 0 0 0 0 0  Injury with Fall? 0 0 0 0 0  Risk for fall due to : No Fall Risks No Fall Risks - - -  Follow up Falls prevention discussed Falls prevention discussed Falls evaluation completed Falls evaluation completed -      Functional Status Survey: Is the patient deaf or have difficulty hearing?: No Does the patient have difficulty seeing, even when  wearing glasses/contacts?: No Does the patient have difficulty concentrating, remembering, or making decisions?: No Does the patient have difficulty walking or climbing stairs?: No Does the patient have difficulty dressing or bathing?: No Does the patient have difficulty doing errands alone such as visiting a doctor's office or shopping?: No    Assessment & Plan  1. Attention deficit hyperactivity disorder (ADHD), predominantly hyperactive type  - amphetamine-dextroamphetamine (ADDERALL XR) 25 MG 24 hr capsule; Take 1 capsule by mouth every morning.  Dispense: 30 capsule; Refill: 0 - amphetamine-dextroamphetamine (ADDERALL XR) 25 MG 24 hr capsule; Take 1 capsule by mouth every morning.  Dispense: 30 capsule; Refill: 0 - amphetamine-dextroamphetamine (ADDERALL XR) 25 MG 24 hr capsule; Take 1 capsule by mouth every morning.  Dispense: 30 capsule; Refill: 0 - amphetamine-dextroamphetamine (ADDERALL) 15 MG tablet; Take 1 tablet by mouth daily. At lunch-when working long hours  Dispense: 30 tablet; Refill: 0 - amphetamine-dextroamphetamine (ADDERALL) 15 MG tablet; Take 1 tablet by mouth daily.  Dispense: 30 tablet; Refill: 0 - amphetamine-dextroamphetamine (ADDERALL) 15 MG tablet; Take 1 tablet by mouth daily.  Dispense: 30  tablet; Refill: 0  2. GAD (generalized anxiety disorder)   3. Chronic bilateral low back pain without sciatica  - cyclobenzaprine (FLEXERIL) 10 MG tablet; Take 0.5-1 tablets (5-10 mg total) by mouth at bedtime.  Dispense: 90 tablet; Refill: 0  4. B12 deficiency   5. PVC (premature ventricular contraction)   6. Perennial allergic rhinitis with seasonal variation   7. Panic attack  - ALPRAZolam (XANAX) 0.5 MG tablet; Take 1 tablet (0.5 mg total) by mouth as needed for anxiety.  Dispense: 35 tablet; Refill: 0

## 2021-11-15 ENCOUNTER — Encounter: Payer: Self-pay | Admitting: Family Medicine

## 2021-11-15 ENCOUNTER — Ambulatory Visit: Payer: BC Managed Care – PPO | Admitting: Family Medicine

## 2021-11-15 VITALS — BP 134/82 | HR 97 | Temp 98.3°F | Resp 16 | Ht 73.0 in | Wt 201.0 lb

## 2021-11-15 DIAGNOSIS — E538 Deficiency of other specified B group vitamins: Secondary | ICD-10-CM | POA: Diagnosis not present

## 2021-11-15 DIAGNOSIS — F411 Generalized anxiety disorder: Secondary | ICD-10-CM

## 2021-11-15 DIAGNOSIS — F901 Attention-deficit hyperactivity disorder, predominantly hyperactive type: Secondary | ICD-10-CM | POA: Diagnosis not present

## 2021-11-15 DIAGNOSIS — G8929 Other chronic pain: Secondary | ICD-10-CM

## 2021-11-15 DIAGNOSIS — F410 Panic disorder [episodic paroxysmal anxiety] without agoraphobia: Secondary | ICD-10-CM

## 2021-11-15 DIAGNOSIS — I493 Ventricular premature depolarization: Secondary | ICD-10-CM

## 2021-11-15 DIAGNOSIS — M545 Low back pain, unspecified: Secondary | ICD-10-CM

## 2021-11-15 DIAGNOSIS — J3089 Other allergic rhinitis: Secondary | ICD-10-CM

## 2021-11-15 DIAGNOSIS — J302 Other seasonal allergic rhinitis: Secondary | ICD-10-CM

## 2021-11-15 MED ORDER — AMPHETAMINE-DEXTROAMPHET ER 25 MG PO CP24: 25.0000 mg | ORAL_CAPSULE | ORAL | 0 refills | Status: DC

## 2021-11-15 MED ORDER — ALPRAZOLAM 0.5 MG PO TABS: 0.5000 mg | ORAL_TABLET | ORAL | 0 refills | Status: DC | PRN

## 2021-11-15 MED ORDER — AMPHETAMINE-DEXTROAMPHETAMINE 15 MG PO TABS: 15.0000 mg | ORAL_TABLET | Freq: Every day | ORAL | 0 refills | Status: DC

## 2021-11-15 MED ORDER — CYCLOBENZAPRINE HCL 10 MG PO TABS: 5.0000 mg | ORAL_TABLET | Freq: Every day | ORAL | 0 refills | Status: DC

## 2021-11-15 MED ORDER — AMPHETAMINE-DEXTROAMPHETAMINE 15 MG PO TABS: 1.0000 | ORAL_TABLET | Freq: Every day | ORAL | 0 refills | Status: DC

## 2022-01-09 ENCOUNTER — Other Ambulatory Visit: Payer: Self-pay | Admitting: Family Medicine

## 2022-01-09 DIAGNOSIS — F901 Attention-deficit hyperactivity disorder, predominantly hyperactive type: Secondary | ICD-10-CM

## 2022-01-09 NOTE — Telephone Encounter (Signed)
Medication Refill - Medication:   amphetamine-dextroamphetamine (ADDERALL XR) 25 MG 24 hr capsule  Has the patient contacted their pharmacy? Yes.   Contact pcp. (Agent: If no, request that the patient contact the pharmacy for the refill. If patient does not wish to contact the pharmacy document the reason why and proceed with request.) (Agent: If yes, when and what did the pharmacy advise?)  Preferred Pharmacy (with phone number or street name):   [facility] [address] [facility] - [place], [place] - [address] [facility] [address]  [address] [place] [address]  Phone: [phone] Fax: [phone]   Has the patient been seen for an appointment in the last year OR does the patient have an upcoming appointment? Yes.    Agent: Please be advised that RX refills may take up to 3 business days. We ask that you follow-up with your pharmacy.

## 2022-01-10 NOTE — Telephone Encounter (Signed)
Requested medication (s) are due for refill today: NO  Requested medication (s) are on the active medication list: yes  Last refill:  11/15/21 #30 but has 5 more rx dated each month  Future visit scheduled: 02/13/22  Notes to clinic:  This medication can not be delegated, please assess.        Requested Prescriptions  Pending Prescriptions Disp Refills   amphetamine-dextroamphetamine (ADDERALL XR) 25 MG 24 hr capsule 30 capsule 0    Sig: Take 1 capsule by mouth every morning.     Not Delegated - Psychiatry:  Stimulants/ADHD Failed - 01/09/2022  5:27 PM      Failed - This refill cannot be delegated      Failed - Urine Drug Screen completed in last 360 days      Passed - Last BP in normal range    BP Readings from Last 1 Encounters:  11/15/21 134/82          Passed - Last Heart Rate in normal range    Pulse Readings from Last 1 Encounters:  11/15/21 97          Passed - Valid encounter within last 6 months    Recent Outpatient Visits           1 month ago Attention deficit hyperactivity disorder (ADHD), predominantly hyperactive type   [facility] Alba Cory, MD   4 months ago Attention deficit hyperactivity disorder (ADHD), predominantly hyperactive type   [facility] Alba Cory, MD   7 months ago GAD (generalized anxiety disorder)   [facility] [place], Danna Hefty, MD   10 months ago Attention deficit hyperactivity disorder (ADHD), predominantly hyperactive type   [facility] Alba Cory, MD   1 year ago Attention deficit hyperactivity disorder (ADHD), predominantly hyperactive type   [facility] Alba Cory, MD       Future Appointments             In 1 month Carlynn Purl, Danna Hefty, MD [facility]   In 2 months Alba Cory, MD [facility], [facility]

## 2022-01-10 NOTE — Telephone Encounter (Signed)
Pharmacy is closed for lunch from 2-3pm. I will give them a call at that time and check the status.

## 2022-01-13 ENCOUNTER — Other Ambulatory Visit: Payer: Self-pay | Admitting: Nurse Practitioner

## 2022-01-13 ENCOUNTER — Telehealth: Payer: Self-pay

## 2022-01-13 DIAGNOSIS — F901 Attention-deficit hyperactivity disorder, predominantly hyperactive type: Secondary | ICD-10-CM

## 2022-01-13 MED ORDER — AMPHETAMINE-DEXTROAMPHETAMINE 10 MG PO TABS: 10.0000 mg | ORAL_TABLET | Freq: Every day | ORAL | 0 refills | Status: DC

## 2022-01-13 NOTE — Telephone Encounter (Signed)
Notified. 

## 2022-01-13 NOTE — Telephone Encounter (Signed)
Copied from CRM [id number]. Topic: General - Other >> Jan 13, 2022 10:04 AM Marylen Ponto wrote: Reason for CRM: Pt spouse [facility] stated the Rx for amphetamine-dextroamphetamine (ADDERALL) 15 MG tablet is out of stock in all local pharmacies that they contacted. [facility] also stated there is no eta on when the 15 MG dose will be available but they do have either the 10 mg or 20 mg in stock. Pt spouse requests that a new Rx be sent to pt pharmacy and that the pt be contacted to advise

## 2022-01-13 NOTE — Telephone Encounter (Signed)
Do you either of you feel comfortable in doing this, or just wait til Monday when she returns?

## 2022-02-10 NOTE — Progress Notes (Signed)
Name: William Rivers   MRN: [id number]    William HoehnOB: 02-08-1991   Date:02/13/2022 ? ?     Progress Note ? ?Subjective ? ?Chief Complaint ? ?Follow Up ? ?HPI ? ?ADHD: he is taking medication as prescribed The medication helps him focus and also calms his mind. He denies side effects of medication.He is aware that Adderal is a controlled medication and he cannot share, sale or misuse. He is compliant  ?  ?Palpitation: he developed symptoms in 2021  he decreased caffeine intake and symptoms resolved, symptoms increased again earlier in 2022 we referred him to cardiology, he wore a holter monitor that showed PVC's he was given metoprolol but stopped taking it after a few days due to side effects " I didn't feel right", episodes are intermittent and not associated with chest pain or SOB. Episodes are intermittent. He worked all day yesterday and had to take half last night because he was anxious and could not relax.  ? 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 He is stable and does not want to see a therapist . He went a few weeks without taking medications back in Nov, however had one week in December that he was having daily panic attacks, he is back to his baseline now.  ?  ?Varicose veins: brother and grandfather have same problems. He states he has noticed leg heaviness and the bulging is worse at the end of the day. He denies lower extremity edema. He wears compression stocking hoses occasionally . Discussed vascular surgeon but he wants to hold off for now.  ?  ?Chronic low back pain: pain is described as constant and dull, usually at the end of the day, feels more stiff than painful, he states stable. Used to have radiculitis but not recently , he stopped taking gabapentin,  but takes flexeril prn and he has enough at home.  Taking it at most twice per week.  ? ?AR: he is using Astelin prn ,  he states only mild sneezing this season, doing well so far   ? ?Vitamin B12: he has not been taking supplements as recommended, he forgets to take it. We will give him a B12 injection today ? ?Patient Active Problem List  ? Diagnosis Date Noted  ? Arm laceration, right, initial encounter 09/13/2020  ? ADD (attention deficit disorder) 05/29/2015  ? Chronic LBP 05/29/2015  ? Lactose intolerance 05/29/2015  ? Panic attack 05/29/2015  ? Perennial allergic rhinitis with seasonal variation 05/29/2015  ? Lumbosacral radiculitis 05/29/2015  ? ? ?Past Surgical History:  ?Procedure Laterality Date  ? TONSILLECTOMY    ? VASECTOMY Bilateral 02/21/2018  ? ? ?Family History  ?Problem Relation Age of Onset  ? Anxiety disorder Mother   ? ? ?Social History  ? ?Tobacco Use  ? Smoking status: Never  ? Smokeless tobacco: Current  ?  Types: Chew  ?Substance Use Topics  ? Alcohol use: Yes  ?  Alcohol/week: 18.0 standard drinks  ?  Types: 6 Cans of beer, 12 Standard drinks or equivalent per week  ? ? ? ?Current Outpatient Medications:  ?  ALPRAZolam (XANAX) 0.5 MG tablet, Take 1 tablet (0.5 mg total) by mouth as needed for anxiety., Disp: 35 tablet, Rfl: 0 ?  amphetamine-dextroamphetamine (ADDERALL XR) 25 MG 24 hr capsule, Take 1 capsule by mouth every morning., Disp: 30 capsule, Rfl: 0 ?  amphetamine-dextroamphetamine (ADDERALL XR) 25 MG  24 hr capsule, Take 1 capsule by mouth every morning., Disp: 30 capsule, Rfl: 0 ?  amphetamine-dextroamphetamine (ADDERALL XR) 25 MG 24 hr capsule, Take 1 capsule by mouth every morning., Disp: 30 capsule, Rfl: 0 ?  amphetamine-dextroamphetamine (ADDERALL) 10 MG tablet, Take 1 tablet (10 mg total) by mouth daily., Disp: 30 tablet, Rfl: 0 ?  amphetamine-dextroamphetamine (ADDERALL) 15 MG tablet, Take 1 tablet by mouth daily. At lunch-when working long hours, Disp: 30 tablet, Rfl: 0 ?  amphetamine-dextroamphetamine (ADDERALL) 15 MG tablet, Take 1 tablet by mouth daily., Disp: 30 tablet, Rfl: 0 ?  amphetamine-dextroamphetamine (ADDERALL) 15 MG tablet, Take 1 tablet  by mouth daily., Disp: 30 tablet, Rfl: 0 ?  azelastine (ASTELIN) 0.1 % nasal spray, Place 1 spray into both nostrils 2 (two) times daily. Use in each nostril as directed, Disp: 30 mL, Rfl: 2 ?  cyclobenzaprine (FLEXERIL) 10 MG tablet, Take 0.5-1 tablets (5-10 mg total) by mouth at bedtime., Disp: 90 tablet, Rfl: 0 ? ?No Known Allergies ? ?I personally reviewed active problem list, medication list, allergies, family history, social history, health maintenance with the patient/caregiver today. ? ? ?ROS ? ?Constitutional: Negative for fever or weight change.  ?Respiratory: Negative for cough and shortness of breath.   ?Cardiovascular: Negative for chest pain or palpitations.  ?Gastrointestinal: Negative for abdominal pain, no bowel changes.  ?Musculoskeletal: Negative for gait problem or joint swelling.  ?Skin: Negative for rash.  ?Neurological: Negative for dizziness or headache.  ?No other specific complaints in a complete review of systems (except as listed in HPI above).  ? ?Objective ? ?Vitals:  ? 02/13/22 1503  ?BP: 136/84  ?Pulse: 93  ?Resp: 16  ?SpO2: 99%  ?Weight: 203 lb (92.1 kg)  ?Height: 6\' 1"  (1.854 m)  ? ? ?Body mass index is 26.78 kg/m?. ? ?Physical Exam ? ?Constitutional: Patient appears well-developed and well-nourished. Overweight. No distress.  ?HEENT: head atraumatic, normocephalic, pupils equal and reactive to light, neck supple ?Cardiovascular: Normal rate, regular rhythm and normal heart sounds.  No murmur heard. No BLE edema. ?Pulmonary/Chest: Effort normal and breath sounds normal. No respiratory distress. ?Abdominal: Soft.  There is no tenderness. ?Psychiatric: Patient has a normal mood and affect. behavior is normal. Judgment and thought content normal.  ? ?PHQ2/9: ? ?  02/13/2022  ?  3:03 PM 11/15/2021  ?  2:30 PM 08/24/2021  ?  2:13 PM 05/20/2021  ?  1:11 PM 02/18/2021  ?  1:16 PM  ?Depression screen PHQ 2/9  ?Decreased Interest 0 0 0 0 0  ?Down, Depressed, Hopeless 0 0 0 0 0  ?PHQ - 2 Score 0 0  0 0 0  ?Altered sleeping 0 0  0   ?Tired, decreased energy 0 0  0   ?Change in appetite 0 0  0   ?Feeling bad or failure about yourself  0 0  0   ?Trouble concentrating 0 0  0   ?Moving slowly or fidgety/restless 0 0  0   ?Suicidal thoughts 0 0  0   ?PHQ-9 Score 0 0  0   ?Difficult doing work/chores    Not difficult at all   ?  ?phq 9 is negative ? ? ?Fall Risk: ? ?  02/13/2022  ?  3:03 PM 11/15/2021  ?  2:30 PM 08/24/2021  ?  2:13 PM 05/20/2021  ?  1:11 PM 02/18/2021  ?  1:16 PM  ?Fall Risk   ?Falls in the past year? 0 0 0 0 0  ?  Number falls in past yr: 0 0 0 0 0  ?Injury with Fall? 0 0 0 0 0  ?Risk for fall due to : No Fall Risks No Fall Risks No Fall Risks    ?Follow up Falls prevention discussed Falls prevention discussed Falls prevention discussed Falls evaluation completed Falls evaluation completed  ? ? ? ? ?Functional Status Survey: ?Is the patient deaf or have difficulty hearing?: No ?Does the patient have difficulty seeing, even when wearing glasses/contacts?: No ?Does the patient have difficulty concentrating, remembering, or making decisions?: No ?Does the patient have difficulty walking or climbing stairs?: No ?Does the patient have difficulty dressing or bathing?: No ?Does the patient have difficulty doing errands alone such as visiting a doctor's office or shopping?: No ? ? ? ?Assessment & Plan ? ? ? ?1. Attention deficit hyperactivity disorder (ADHD), predominantly hyperactive type ? ?- amphetamine-dextroamphetamine (ADDERALL XR) 25 MG 24 hr capsule; Take 1 capsule by mouth every morning.  Dispense: 30 capsule; Refill: 0 ?- amphetamine-dextroamphetamine (ADDERALL XR) 25 MG 24 hr capsule; Take 1 capsule by mouth every morning.  Dispense: 30 capsule; Refill: 0 ?- amphetamine-dextroamphetamine (ADDERALL XR) 25 MG 24 hr capsule; Take 1 capsule by mouth every morning.  Dispense: 30 capsule; Refill: 0 ?- amphetamine-dextroamphetamine (ADDERALL) 15 MG tablet; Take 1 tablet by mouth daily. At lunch-when working  long hours  Dispense: 30 tablet; Refill: 0 ?- amphetamine-dextroamphetamine (ADDERALL) 15 MG tablet; Take 1 tablet by mouth daily.  Dispense: 30 tablet; Refill: 0 ?- amphetamine-dextroamphetamine (ADDE

## 2022-02-13 ENCOUNTER — Encounter: Payer: Self-pay | Admitting: Family Medicine

## 2022-02-13 ENCOUNTER — Ambulatory Visit: Payer: BC Managed Care – PPO | Admitting: Family Medicine

## 2022-02-13 VITALS — BP 136/84 | HR 93 | Resp 16 | Ht 73.0 in | Wt 203.0 lb

## 2022-02-13 DIAGNOSIS — F901 Attention-deficit hyperactivity disorder, predominantly hyperactive type: Secondary | ICD-10-CM

## 2022-02-13 DIAGNOSIS — F411 Generalized anxiety disorder: Secondary | ICD-10-CM

## 2022-02-13 DIAGNOSIS — J3089 Other allergic rhinitis: Secondary | ICD-10-CM

## 2022-02-13 DIAGNOSIS — G8929 Other chronic pain: Secondary | ICD-10-CM

## 2022-02-13 DIAGNOSIS — J302 Other seasonal allergic rhinitis: Secondary | ICD-10-CM

## 2022-02-13 DIAGNOSIS — I493 Ventricular premature depolarization: Secondary | ICD-10-CM

## 2022-02-13 DIAGNOSIS — F410 Panic disorder [episodic paroxysmal anxiety] without agoraphobia: Secondary | ICD-10-CM

## 2022-02-13 DIAGNOSIS — M545 Low back pain, unspecified: Secondary | ICD-10-CM | POA: Diagnosis not present

## 2022-02-13 DIAGNOSIS — E538 Deficiency of other specified B group vitamins: Secondary | ICD-10-CM

## 2022-02-13 MED ORDER — AMPHETAMINE-DEXTROAMPHETAMINE 15 MG PO TABS: 15.0000 mg | ORAL_TABLET | Freq: Every day | ORAL | 0 refills | Status: DC

## 2022-02-13 MED ORDER — AMPHETAMINE-DEXTROAMPHET ER 25 MG PO CP24: 25.0000 mg | ORAL_CAPSULE | ORAL | 0 refills | Status: DC

## 2022-02-13 MED ORDER — AMPHETAMINE-DEXTROAMPHETAMINE 15 MG PO TABS: 1.0000 | ORAL_TABLET | Freq: Every day | ORAL | 0 refills | Status: DC

## 2022-03-10 ENCOUNTER — Telehealth: Payer: Self-pay

## 2022-03-10 NOTE — Telephone Encounter (Signed)
Patient wife called in asking if she can get a call back from Dr Carlynn Purl or nurse today since patient is out of medication and this is an urgent matter. Please advise  ?

## 2022-03-10 NOTE — Telephone Encounter (Signed)
Copied from CRM [id number]. Topic: General - Inquiry >> Mar 10, 2022  9:14 AM Marylen Ponto wrote: Reason for CRM: Pt wife reports that the pharmacy does not have amphetamine-dextroamphetamine (ADDERALL XR) 25 MG 24 hr capsule in stock but they do have amphetamine-dextroamphetamine (ADDERALL XR) 10 MG 24 hr capsules. Pt wife asked if a new Rx could be written and sent in and someone calls once it has been sent. Cb# [id number]

## 2022-03-21 ENCOUNTER — Encounter: Payer: BC Managed Care – PPO | Admitting: Family Medicine

## 2022-03-21 NOTE — Progress Notes (Deleted)
Name: William Rivers   MRN: [id number]    DOB: June 11, 1991   Date:03/21/2022       Progress Note  Subjective  Chief Complaint  No chief complaint on file.   HPI  Patient presents for annual CPE ***.  IPSS Questionnaire (AUA-7): Over the past month.   1)  How often have you had a sensation of not emptying your bladder completely after you finish urinating?  {Rating:19227}  2)  How often have you had to urinate again less than two hours after you finished urinating? {Rating:19227}  3)  How often have you found you stopped and started again several times when you urinated?  {Rating:19227}  4) How difficult have you found it to postpone urination?  {Rating:19227}  5) How often have you had a weak urinary stream?  {Rating:19227}  6) How often have you had to push or strain to begin urination?  {Rating:19227}  7) How many times did you most typically get up to urinate from the time you went to bed until the time you got up in the morning?  {Rating:19228}  Total score:  0-7 mildly symptomatic   8-19 moderately symptomatic   20-35 severely symptomatic     Diet: *** Exercise: ***  Depression: phq 9 is {gen pos neg:[id number]}    02/13/2022    3:03 PM 11/15/2021    2:30 PM 08/24/2021    2:13 PM 05/20/2021    1:11 PM 02/18/2021    1:16 PM  Depression screen PHQ 2/9  Decreased Interest 0 0 0 0 0  Down, Depressed, Hopeless 0 0 0 0 0  PHQ - 2 Score 0 0 0 0 0  Altered sleeping 0 0  0   Tired, decreased energy 0 0  0   Change in appetite 0 0  0   Feeling bad or failure about yourself  0 0  0   Trouble concentrating 0 0  0   Moving slowly or fidgety/restless 0 0  0   Suicidal thoughts 0 0  0   PHQ-9 Score 0 0  0   Difficult doing work/chores    Not difficult at all     Hypertension:  BP Readings from Last 3 Encounters:  02/13/22 136/84  11/15/21 134/82  08/24/21 118/76    Obesity: Wt Readings from Last 3 Encounters:  02/13/22 203 lb (92.1 kg)  11/15/21 201 lb (91.2 kg)  08/24/21 194  lb (88 kg)   BMI Readings from Last 3 Encounters:  02/13/22 26.78 kg/m  11/15/21 26.52 kg/m  08/24/21 25.60 kg/m     Lipids:  Lab Results  Component Value Date   CHOL 190 12/07/2020   CHOL 133 11/09/2017   Lab Results  Component Value Date   HDL 59 12/07/2020   HDL 45 11/09/2017   Lab Results  Component Value Date   LDLCALC 115 (H) 12/07/2020   LDLCALC 73 11/09/2017   Lab Results  Component Value Date   TRIG 67 12/07/2020   TRIG 66 11/09/2017   Lab Results  Component Value Date   CHOLHDL 3.2 12/07/2020   CHOLHDL 3.0 11/09/2017   No results found for: LDLDIRECT Glucose:  Glucose, Bld  Date Value Ref Range Status  12/07/2020 91 65 - 99 mg/dL Final    Comment:    .            Fasting reference interval .   01/15/2019 83 65 - 99 mg/dL Final    Comment:    .  Fasting reference interval .   12/26/2018 114 (H) 70 - 99 mg/dL Final    [place] Office Visit from 05/20/2021 in [facility]  AUDIT-C Score 0      ***  Married STD testing and prevention (HIV/chl/gon/syphilis):  {yes/no/default [id number] applicable"} Sexual history:  Hep C Screening:  Skin cancer: Discussed monitoring for atypical lesions Colorectal cancer: *** Prostate cancer:  {yes/no/default [id number] applicable"} No results found for: PSA   Lung cancer:  Low Dose CT Chest recommended if Age 64-80 years, 30 pack-year currently smoking OR have quit w/in 15years. Patient  {Response; is/is not/na:63} a candidate for screening   AAA: The USPSTF recommends one-time screening with ultrasonography in men ages 75 to 32 years who have ever smoked. Patient   {Response; is /is not/na:63}, a candidate for screening  ECG:  ***  Vaccines:   HPV:  Tdap:  Shingrix:  Pneumonia:  Flu:  COVID-19:  Advanced Care Planning: A voluntary discussion about advance care planning including the explanation and discussion of advance directives.  Discussed health  care proxy and Living will, and the patient was able to identify a health care proxy as ***.  Patient {DOES_DOES [id number] have a living will and power of attorney of health care   Patient Active Problem List   Diagnosis Date Noted   Arm laceration, right, initial encounter 09/13/2020   ADD (attention deficit disorder) 05/29/2015   Chronic LBP 05/29/2015   Lactose intolerance 05/29/2015   Panic attack 05/29/2015   Perennial allergic rhinitis with seasonal variation 05/29/2015   Lumbosacral radiculitis 05/29/2015    Past Surgical History:  Procedure Laterality Date   TONSILLECTOMY     VASECTOMY Bilateral 02/21/2018    Family History  Problem Relation Age of Onset   Anxiety disorder Mother     Social History   Socioeconomic History   Marital status: Married    Spouse name: Education officer, environmental    Number of children: 3   Years of education: Not on file   Highest education level: 12th grade  Occupational History   Occupation: Clinical biochemist   Tobacco Use   Smoking status: Never   Smokeless tobacco: Current    Types: Chew  Vaping Use   Vaping Use: Never used  Substance and Sexual Activity   Alcohol use: Yes    Alcohol/week: 18.0 standard drinks    Types: 6 Cans of beer, 12 Standard drinks or equivalent per week   Drug use: No   Sexual activity: Yes    Partners: Female    Birth control/protection: I.U.D.  Other Topics Concern   Not on file  Social History Narrative   Living with wife  - Tamera Punt she is mother of one of his kids ( son)    He has two older children from previous relationship.    Social Determinants of Health   Financial Resource Strain: Not on file  Food Insecurity: Not on file  Transportation Needs: Not on file  Physical Activity: Not on file  Stress: Not on file  Social Connections: Not on file  Intimate Partner Violence: Not on file     Current Outpatient Medications:    ALPRAZolam (XANAX) 0.5 MG tablet, Take 1 tablet (0.5 mg total) by mouth as needed  for anxiety., Disp: 35 tablet, Rfl: 0   amphetamine-dextroamphetamine (ADDERALL XR) 25 MG 24 hr capsule, Take 1 capsule by mouth every morning., Disp: 30 capsule, Rfl: 0   amphetamine-dextroamphetamine (ADDERALL XR) 25 MG 24 hr capsule, Take 1 capsule by  mouth every morning., Disp: 30 capsule, Rfl: 0   amphetamine-dextroamphetamine (ADDERALL XR) 25 MG 24 hr capsule, Take 1 capsule by mouth every morning., Disp: 30 capsule, Rfl: 0   amphetamine-dextroamphetamine (ADDERALL) 10 MG tablet, Take 1 tablet (10 mg total) by mouth daily., Disp: 30 tablet, Rfl: 0   amphetamine-dextroamphetamine (ADDERALL) 15 MG tablet, Take 1 tablet by mouth daily. At [place] working long hours, Disp: 30 tablet, Rfl: 0   amphetamine-dextroamphetamine (ADDERALL) 15 MG tablet, Take 1 tablet by mouth daily., Disp: 30 tablet, Rfl: 0   amphetamine-dextroamphetamine (ADDERALL) 15 MG tablet, Take 1 tablet by mouth daily., Disp: 30 tablet, Rfl: 0   azelastine (ASTELIN) 0.1 % nasal spray, Place 1 spray into both nostrils 2 (two) times daily. Use in each nostril as directed, Disp: 30 mL, Rfl: 2   cyclobenzaprine (FLEXERIL) 10 MG tablet, Take 0.5-1 tablets (5-10 mg total) by mouth at bedtime., Disp: 90 tablet, Rfl: 0  No Known Allergies   ROS  ***   Objective  There were no vitals filed for this visit.  There is no height or weight on file to calculate BMI.  Physical Exam ***  No results found for this or any previous visit (from the past 2160 hour(s)).   Fall Risk:    02/13/2022    3:03 PM 11/15/2021    2:30 PM 08/24/2021    2:13 PM 05/20/2021    1:11 PM 02/18/2021    1:16 PM  [place] in the past year? 0 0 0 0 0  Number falls in past yr: 0 0 0 0 0  Injury with Fall? 0 0 0 0 0  Risk for fall due to : No Fall Risks No Fall Risks No Fall Risks    Follow up Falls prevention discussed Falls prevention discussed Falls prevention discussed Falls evaluation completed Falls evaluation completed      Functional Status Survey:      Assessment & Plan  1. Well adult exam ***  2. B12 deficiency ***  3. Diabetes mellitus screening ***  4. Long-term use of high-risk medication ***  5. Lipid screening ***    -Prostate cancer screening and PSA options (with potential risks and benefits of testing vs not testing) were discussed along with recent recs/guidelines. -USPSTF grade A and B recommendations reviewed with patient; age-appropriate recommendations, preventive care, screening tests, etc discussed and encouraged; healthy living encouraged; see AVS for patient education given to patient -Discussed importance of 150 minutes of physical activity weekly, eat two servings of fish weekly, eat one serving of tree nuts ( cashews, pistachios, pecans, almonds.Marland Kitchen) every other day, eat 6 servings of fruit/vegetables daily and drink plenty of water and avoid sweet beverages.  -Reviewed Health Maintenance: {yes/no/default [id number] applicable"}

## 2022-03-21 NOTE — Patient Instructions (Incomplete)
Name: William Rivers   MRN: [id number]    DOB: [birth year]   Date:03/21/2022 ? ?     Progress Note ? ?Subjective ? ?Chief Complaint ? ?No chief complaint on file. ? ? ?@HPI @ ? ?Patient presents for annual CPE ***. ? ?IPSS Questionnaire (AUA-7): ?Over the past month?   ?1)  How often have you had a sensation of not emptying your bladder completely after you finish urinating?  {Rating:19227}  ?2)  How often have you had to urinate again less than two hours after you finished urinating? {Rating:19227}  ?3)  How often have you found you stopped and started again several times when you urinated?  {Rating:19227}  ?4) How difficult have you found it to postpone urination?  {Rating:19227}  ?5) How often have you had a weak urinary stream?  {Rating:19227}  ?6) How often have you had to push or strain to begin urination?  {Rating:19227}  ?7) How many times did you most typically get up to urinate from the time you went to bed until the time you got up in the morning?  {Rating:19228}  ?Total score:  0-7 mildly symptomatic  ? 8-19 moderately symptomatic  ? 20-35 severely symptomatic  ?  ? ?Diet: *** ?Exercise: *** ? ?Depression: phq 9 is {gen pos 09-27-1992 ? ?  02/13/2022  ?  3:03 PM 11/15/2021  ?  2:30 PM 08/24/2021  ?  2:13 PM 05/20/2021  ?  1:11 PM 02/18/2021  ?  1:16 PM  ?Depression screen PHQ 2/9  ?Decreased Interest 0 0 0 0 0  ?Down, Depressed, Hopeless 0 0 0 0 0  ?PHQ - 2 Score 0 0 0 0 0  ?Altered sleeping 0 0  0   ?Tired, decreased energy 0 0  0   ?Change in appetite 0 0  0   ?Feeling bad or failure about yourself  0 0  0   ?Trouble concentrating 0 0  0   ?Moving slowly or fidgety/restless 0 0  0   ?Suicidal thoughts 0 0  0   ?PHQ-9 Score 0 0  0   ?Difficult doing work/chores    Not difficult at all   ? ? ?Hypertension:  ?BP Readings from Last 3 Encounters:  ?02/13/22 136/84  ?11/15/21 134/82  ?08/24/21 118/76  ? ? ?Obesity: ?Wt Readings from Last 3 Encounters:  ?02/13/22 203 lb (92.1 kg)  ?11/15/21 201 lb (91.2 kg)  ?08/24/21  194 lb (88 kg)  ? ?BMI Readings from Last 3 Encounters:  ?02/13/22 26.78 kg/m?  ?11/15/21 26.52 kg/m?  ?08/24/21 25.60 kg/m?  ?  ? ?Lipids:  ?Lab Results  ?Component Value Date  ? CHOL 190 12/07/2020  ? CHOL 133 11/09/2017  ? ?Lab Results  ?Component Value Date  ? HDL 59 12/07/2020  ? HDL 45 11/09/2017  ? ?Lab Results  ?Component Value Date  ? LDLCALC 115 (H) 12/07/2020  ? LDLCALC 73 11/09/2017  ? ?Lab Results  ?Component Value Date  ? TRIG 67 12/07/2020  ? TRIG 66 11/09/2017  ? ?Lab Results  ?Component Value Date  ? CHOLHDL 3.2 12/07/2020  ? CHOLHDL 3.0 11/09/2017  ? ?No results found for: LDLDIRECT ?Glucose:  ?Glucose, Bld  ?Date Value Ref Range Status  ?12/07/2020 91 65 - 99 mg/dL Final  ?  Comment:  ?  . ?           Fasting reference interval ?. ?  ?01/15/2019 83 65 - 99 mg/dL Final  ?  Comment:  ?  01/17/2019 ?  Fasting reference interval ?. ?  ?12/26/2018 114 (H) 70 - 99 mg/dL Final  ? ? ?@AUDITCSCORE @ ?*** ? ?@MARITALSTS @ ?STD testing and prevention (HIV/chl/gon/syphilis):  {yes/no/default n/a:21102::"not applicable"} ?Sexual history:  ?Hep C Screening:  ?Skin cancer: Discussed monitoring for atypical lesions ?Colorectal cancer: *** ?Prostate cancer:  {yes/no/default n/a:21102::"not applicable"} ?No results found for: PSA ? ? ?Lung cancer:  Low Dose CT Chest recommended if Age 41-80 years, 30 pack-year currently smoking OR have quit w/in 15years. Patient  {Response; is/is not/na:63} a candidate for screening   ?AAA: The USPSTF recommends one-time screening with ultrasonography in men ages 29 to 75 years who have ever smoked. Patient   {Response; is /is not/na:63}, a candidate for screening  ?ECG:  *** ? ?Vaccines:  ? ?HPV:  ?Tdap:  ?Shingrix:  ?Pneumonia:  ?Flu:  ?COVID-19: ? ?Advanced Care Planning: A voluntary discussion about advance care planning including the explanation and discussion of advance directives.  Discussed health care proxy and Living will, and the patient was able to identify a health care  proxy as ***.  Patient {DOES_DOES have a living will and power of attorney of health care  ? ?Patient Active Problem List  ? Diagnosis Date Noted  ? Arm laceration, right, initial encounter 09/13/2020  ? ADD (attention deficit disorder) 05/29/2015  ? Chronic LBP 05/29/2015  ? Lactose intolerance 05/29/2015  ? Panic attack 05/29/2015  ? Perennial allergic rhinitis with seasonal variation 05/29/2015  ? Lumbosacral radiculitis 05/29/2015  ? ? ?Past Surgical History:  ?Procedure Laterality Date  ? TONSILLECTOMY    ? VASECTOMY Bilateral 02/21/2018  ? ? ?Family History  ?Problem Relation Age of Onset  ? Anxiety disorder Mother   ? ? ?Social History  ? ?Socioeconomic History  ? Marital status: Married  ?  Spouse name: 07/30/2015   ? Number of children: 3  ? Years of education: Not on file  ? Highest education level: 12th grade  ?Occupational History  ? Occupation: electrician   ?Tobacco Use  ? Smoking status: Never  ? Smokeless tobacco: Current  ?  Types: Chew  ?Vaping Use  ? Vaping Use: Never used  ?Substance and Sexual Activity  ? Alcohol use: Yes  ?  Alcohol/week: 18.0 standard drinks  ?  Types: 6 Cans of beer, 12 Standard drinks or equivalent per week  ? Drug use: No  ? Sexual activity: Yes  ?  Partners: Female  ?  Birth control/protection: I.U.D.  ?Other Topics Concern  ? Not on file  ?Social History Narrative  ? Living with wife  - 04/23/2018 she is mother of one of his kids ( son)   ? He has two older children from previous relationship.   ? ?Social Determinants of Health  ? ?Financial Resource Strain: Not on file  ?Food Insecurity: Not on file  ?Transportation Needs: Not on file  ?Physical Activity: Not on file  ?Stress: Not on file  ?Social Connections: Not on file  ?Intimate Partner Violence: Not on file  ? ? ? ?Current Outpatient Medications:  ?  ALPRAZolam (XANAX) 0.5 MG tablet, Take 1 tablet (0.5 mg total) by mouth as needed for anxiety., Disp: 35 tablet, Rfl: 0 ?  amphetamine-dextroamphetamine  (ADDERALL XR) 25 MG 24 hr capsule, Take 1 capsule by mouth every morning., Disp: 30 capsule, Rfl: 0 ?  amphetamine-dextroamphetamine (ADDERALL XR) 25 MG 24 hr capsule, Take 1 capsule by mouth every morning., Disp: 30 capsule, Rfl: 0 ?  amphetamine-dextroamphetamine (ADDERALL XR) 25 MG 24 hr capsule, Take  1 capsule by mouth every morning., Disp: 30 capsule, Rfl: 0 ?  amphetamine-dextroamphetamine (ADDERALL) 10 MG tablet, Take 1 tablet (10 mg total) by mouth daily., Disp: 30 tablet, Rfl: 0 ?  amphetamine-dextroamphetamine (ADDERALL) 15 MG tablet, Take 1 tablet by mouth daily. At lunch-when working long hours, Disp: 30 tablet, Rfl: 0 ?  amphetamine-dextroamphetamine (ADDERALL) 15 MG tablet, Take 1 tablet by mouth daily., Disp: 30 tablet, Rfl: 0 ?  amphetamine-dextroamphetamine (ADDERALL) 15 MG tablet, Take 1 tablet by mouth daily., Disp: 30 tablet, Rfl: 0 ?  azelastine (ASTELIN) 0.1 % nasal spray, Place 1 spray into both nostrils 2 (two) times daily. Use in each nostril as directed, Disp: 30 mL, Rfl: 2 ?  cyclobenzaprine (FLEXERIL) 10 MG tablet, Take 0.5-1 tablets (5-10 mg total) by mouth at bedtime., Disp: 90 tablet, Rfl: 0 ? ?No Known Allergies ? ? ?@ROS @ ? ?*** ? ? ?Objective ? ?There were no vitals filed for this visit. ? ?There is no height or weight on file to calculate BMI. ? ?@PHYSEXAM @ ?*** ? ?No results found for this or any previous visit (from the past 2160 hour(s)). ? ? ?Fall Risk: ? ?  02/13/2022  ?  3:03 PM 11/15/2021  ?  2:30 PM 08/24/2021  ?  2:13 PM 05/20/2021  ?  1:11 PM 02/18/2021  ?  1:16 PM  ?Fall Risk   ?Falls in the past year? 0 0 0 0 0  ?Number falls in past yr: 0 0 0 0 0  ?Injury with Fall? 0 0 0 0 0  ?Risk for fall due to : No Fall Risks No Fall Risks No Fall Risks    ?Follow up Falls prevention discussed Falls prevention discussed Falls prevention discussed Falls evaluation completed Falls evaluation completed  ? ? ? ?Functional Status Survey: ?  ? ? ? ?Assessment & Plan ? ?There are no diagnoses  linked to this encounter. ? ? ?-Prostate cancer screening and PSA options (with potential risks and benefits of testing vs not testing) were discussed along with recent recs/guidelines. ?-USPSTF grade A and B r

## 2022-03-23 ENCOUNTER — Ambulatory Visit: Payer: BC Managed Care – PPO | Admitting: Family Medicine

## 2022-03-23 ENCOUNTER — Encounter: Payer: Self-pay | Admitting: Family Medicine

## 2022-03-23 VITALS — BP 124/72 | HR 98 | Temp 98.1°F | Resp 16 | Ht 73.0 in | Wt 209.3 lb

## 2022-03-23 DIAGNOSIS — J0100 Acute maxillary sinusitis, unspecified: Secondary | ICD-10-CM

## 2022-03-23 DIAGNOSIS — Z0000 Encounter for general adult medical examination without abnormal findings: Secondary | ICD-10-CM

## 2022-03-23 MED ORDER — RYALTRIS 665-25 MCG/ACT NA SUSP: 1.0000 | Freq: Two times a day (BID) | NASAL | 1 refills | Status: DC

## 2022-03-23 MED ORDER — CETIRIZINE HCL 10 MG PO TABS: 10.0000 mg | ORAL_TABLET | Freq: Every day | ORAL | 0 refills | Status: DC

## 2022-03-23 NOTE — Progress Notes (Signed)
Name: William Rivers   MRN: [id number]    DOB: 03/17/1991   Date:03/23/2022 ? ?     Progress Note ? ?Subjective ? ?Chief Complaint ? ?Annual Exam ? ?HPI ? ?Patient presents for annual CPE and follow up ?Patient asked for a split visit and paid his co-pay ? ?Sinus pressure: he states once or twice a year he has this symptoms. He states symptoms started 6 days ago, he has increase in post-nasal drainage, mucus nasal discharge, facial pressure, teeth are sore ( on upper side and right worse than left side) hard breath from his nose due to nasal congestion. He has body aches, no fever or chills. Normal taste and smell of food, but tastes weird in his mouth Denies sore throat or otalgia. No sick contacts at home. . ? ?IPSS Questionnaire (AUA-7): ?Over the past month?   ?1)  How often have you had a sensation of not emptying your bladder completely after you finish urinating?  0 - Not at all  ?2)  How often have you had to urinate again less than two hours after you finished urinating? 0 - Not at all  ?3)  How often have you found you stopped and started again several times when you urinated?  0 - Not at all  ?4) How difficult have you found it to postpone urination?  0 - Not at all  ?5) How often have you had a weak urinary stream?  0 - Not at all  ?6) How often have you had to push or strain to begin urination?  0 - Not at all  ?7) How many times did you most typically get up to urinate from the time you went to bed until the time you got up in the morning?  0 - None  ?Total score:  0-7 mildly symptomatic  ? 8-19 moderately symptomatic  ? 20-35 severely symptomatic  ?  ? ?Diet: he does not have a balanced diet, discussed adding fruit, vegetables and lean meat  ?Exercise: continue physical activity  ? ?Depression: phq 9 is negative ? ?  03/23/2022  ? 11:31 AM 02/13/2022  ?  3:03 PM 11/15/2021  ?  2:30 PM 08/24/2021  ?  2:13 PM 05/20/2021  ?  1:11 PM  ?Depression screen PHQ 2/9  ?Decreased Interest 0 0 0 0 0  ?Down, Depressed,  Hopeless 0 0 0 0 0  ?PHQ - 2 Score 0 0 0 0 0  ?Altered sleeping 0 0 0  0  ?Tired, decreased energy 0 0 0  0  ?Change in appetite 0 0 0  0  ?Feeling bad or failure about yourself  0 0 0  0  ?Trouble concentrating 0 0 0  0  ?Moving slowly or fidgety/restless 0 0 0  0  ?Suicidal thoughts 0 0 0  0  ?PHQ-9 Score 0 0 0  0  ?Difficult doing work/chores     Not difficult at all  ? ? ?Hypertension:  ?BP Readings from Last 3 Encounters:  ?03/23/22 124/72  ?02/13/22 136/84  ?11/15/21 134/82  ? ? ?Obesity: ?Wt Readings from Last 3 Encounters:  ?03/23/22 209 lb 4.8 oz (94.9 kg)  ?02/13/22 203 lb (92.1 kg)  ?11/15/21 201 lb (91.2 kg)  ? ?BMI Readings from Last 3 Encounters:  ?03/23/22 27.61 kg/m?  ?02/13/22 26.78 kg/m?  ?11/15/21 26.52 kg/m?  ?  ? ?Lipids:  ?Lab Results  ?Component Value Date  ? CHOL 190 12/07/2020  ? CHOL 133 11/09/2017  ? ?Lab  Results  ?Component Value Date  ? HDL 59 12/07/2020  ? HDL 45 11/09/2017  ? ?Lab Results  ?Component Value Date  ? LDLCALC 115 (H) 12/07/2020  ? LDLCALC 73 11/09/2017  ? ?Lab Results  ?Component Value Date  ? TRIG 67 12/07/2020  ? TRIG 66 11/09/2017  ? ?Lab Results  ?Component Value Date  ? CHOLHDL 3.2 12/07/2020  ? CHOLHDL 3.0 11/09/2017  ? ?No results found for: LDLDIRECT ?Glucose:  ?Glucose, Bld  ?Date Value Ref Range Status  ?12/07/2020 91 65 - 99 mg/dL Final  ?  Comment:  ?  . ?           Fasting reference interval ?. ?  ?01/15/2019 83 65 - 99 mg/dL Final  ?  Comment:  ?  . ?           Fasting reference interval ?. ?  ?12/26/2018 114 (H) 70 - 99 mg/dL Final  ? ? ?Flowsheet Row Office Visit from 03/23/2022 in [facility]  ?AUDIT-C Score 1  ? ?  ? ? ?Married ?STD testing and prevention (HIV/chl/gon/syphilis):  not applicable ?Sexual history: one sexual partner ?Hep C Screening: 01/22 ?Skin cancer: Discussed monitoring for atypical lesions ?Colorectal cancer: N/A ?Prostate cancer:  not applicable ?  ?ECG:  02/28/2021 ? ?Vaccines:  ? ?HPV: not interested  ?Tdap: up to  date  ?Pneumonia: N/A ?Flu: refused  ?COVID-19:had one shot, not sure of the type  ? ?Advanced Care Planning: A voluntary discussion about advance care planning including the explanation and discussion of advance directives.  Discussed health care proxy and Living will, and the patient was able to identify a health care proxy as spouse.  Patient does not have a living will and power of attorney of health care  ? ?Patient Active Problem List  ? Diagnosis Date Noted  ? Arm laceration, right, initial encounter 09/13/2020  ? ADD (attention deficit disorder) 05/29/2015  ? Chronic LBP 05/29/2015  ? Lactose intolerance 05/29/2015  ? Panic attack 05/29/2015  ? Perennial allergic rhinitis with seasonal variation 05/29/2015  ? Lumbosacral radiculitis 05/29/2015  ? ? ?Past Surgical History:  ?Procedure Laterality Date  ? TONSILLECTOMY    ? VASECTOMY Bilateral 02/21/2018  ? ? ?Family History  ?Problem Relation Age of Onset  ? Anxiety disorder Mother   ? ? ?Social History  ? ?Socioeconomic History  ? Marital status: Married  ?  Spouse name: Ave Filter   ? Number of children: 3  ? Years of education: Not on file  ? Highest education level: 12th grade  ?Occupational History  ? Occupation: electrician   ?Tobacco Use  ? Smoking status: Never  ? Smokeless tobacco: Current  ?  Types: Chew  ?Vaping Use  ? Vaping Use: Never used  ?Substance and Sexual Activity  ? Alcohol use: Yes  ?  Alcohol/week: 18.0 standard drinks  ?  Types: 6 Cans of beer, 12 Standard drinks or equivalent per week  ? Drug use: No  ? Sexual activity: Yes  ?  Partners: Female  ?  Birth control/protection: I.U.D.  ?Other Topics Concern  ? Not on file  ?Social History Narrative  ? Living with wife  - Ave Filter she is mother of one of his kids ( son)   ? He has two older children from previous relationship. He also has 50-50  custody of them   ? ?Social Determinants of Health  ? ?Financial Resource Strain: Low Risk   ? Difficulty of Paying Living Expenses: Not  hard at all   ?Food Insecurity: No Food Insecurity  ? Worried About Programme researcher, broadcasting/film/video in the Last Year: Never true  ? Ran Out of Food in the Last Year: Never true  ?Transportation Needs: No Transportation Needs  ? Lack of Transportation (Medical): No  ? Lack of Transportation (Non-Medical): No  ?Physical Activity: Sufficiently Active  ? Days of Exercise per Week: 7 days  ? Minutes of Exercise per Session: 60 min  ?Stress: No Stress Concern Present  ? Feeling of Stress : Not at all  ?Social Connections: Moderately Integrated  ? Frequency of Communication with Friends and Family: More than three times a week  ? Frequency of Social Gatherings with Friends and Family: More than three times a week  ? Attends Religious Services: More than 4 times per year  ? Active Member of Clubs or Organizations: No  ? Attends Banker Meetings: Never  ? Marital Status: Married  ?Intimate Partner Violence: Not At Risk  ? Fear of Current or Ex-Partner: No  ? Emotionally Abused: No  ? Physically Abused: No  ? Sexually Abused: No  ? ? ? ?Current Outpatient Medications:  ?  ALPRAZolam (XANAX) 0.5 MG tablet, Take 1 tablet (0.5 mg total) by mouth as needed for anxiety., Disp: 35 tablet, Rfl: 0 ?  amphetamine-dextroamphetamine (ADDERALL XR) 25 MG 24 hr capsule, Take 1 capsule by mouth every morning., Disp: 30 capsule, Rfl: 0 ?  amphetamine-dextroamphetamine (ADDERALL XR) 25 MG 24 hr capsule, Take 1 capsule by mouth every morning., Disp: 30 capsule, Rfl: 0 ?  amphetamine-dextroamphetamine (ADDERALL XR) 25 MG 24 hr capsule, Take 1 capsule by mouth every morning., Disp: 30 capsule, Rfl: 0 ?  amphetamine-dextroamphetamine (ADDERALL) 10 MG tablet, Take 1 tablet (10 mg total) by mouth daily., Disp: 30 tablet, Rfl: 0 ?  amphetamine-dextroamphetamine (ADDERALL) 15 MG tablet, Take 1 tablet by mouth daily. At lunch-when working long hours, Disp: 30 tablet, Rfl: 0 ?  amphetamine-dextroamphetamine (ADDERALL) 15 MG tablet, Take 1 tablet by mouth daily.,  Disp: 30 tablet, Rfl: 0 ?  amphetamine-dextroamphetamine (ADDERALL) 15 MG tablet, Take 1 tablet by mouth daily., Disp: 30 tablet, Rfl: 0 ?  azelastine (ASTELIN) 0.1 % nasal spray, Place 1 spray into both nos

## 2022-05-17 NOTE — Progress Notes (Unsigned)
Name: William Rivers   MRN: [id number]    DOB: [birth year]   Date:05/18/2022       Progress Note  Subjective  Chief Complaint  Follow Up  HPI  ADHD: he is taking medication as prescribed The medication helps him focus and also calms his mind. He denies side effects of medication.He is aware that Adderal is a controlled medication and he cannot share, sale or misuse. He states it does not lasting as long, but we will continue current dose for now    Palpitation: he developed symptoms in 2021  he decreased caffeine intake and symptoms resolved, symptoms increased again earlier in 2022 we referred him to cardiology, he wore a holter monitor that showed PVC's he was given metoprolol but stopped taking it after a few days due to side effects " I didn't feel right", episodes are intermittent and not associated with chest pain or SOB. He has decreased caffeine intake and has improved since.   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 He is stable and does not want to see a therapist . Last fill of alprazolam was Dec based on controlled substance database    Varicose veins: brother and grandfather have same problems. He states he has noticed leg heaviness and the bulging is worse at the end of the day. He denies lower extremity edema. He wears compression stocking hoses occasionally . Discussed vascular surgeon but he wants to hold off for now. Unchanged    Chronic low back pain: pain is described as constant and dull, usually at the end of the day, feels more stiff than painful, he states stable. Used to have radiculitis but not recently , he stopped taking gabapentin,  but takes flexeril prn and he has enough at home.  Taking it at most twice per week. Unchanged   AR: he is using Astelin prn  also zyrtec prn and is doing okay at this time. . Currently no sneezing or nasal congestion   Vitamin B12: he continues to forget to take B12,  we will give him another infection today He feels tired   Patient Active Problem List   Diagnosis Date Noted   Arm laceration, right, initial encounter 09/13/2020   ADD (attention deficit disorder) 05/29/2015   Chronic LBP 05/29/2015   Lactose intolerance 05/29/2015   Panic attack 05/29/2015   Perennial allergic rhinitis with seasonal variation 05/29/2015   Lumbosacral radiculitis 05/29/2015    Past Surgical History:  Procedure Laterality Date   TONSILLECTOMY     VASECTOMY Bilateral 02/21/2018    Family History  Problem Relation Age of Onset   Anxiety disorder Mother     Social History   Tobacco Use   Smoking status: Never   Smokeless tobacco: Current    Types: Chew  Substance Use Topics   Alcohol use: Yes    Alcohol/week: 18.0 standard drinks of alcohol    Types: 6 Cans of beer, 12 Standard drinks or equivalent per week     Current Outpatient Medications:    ALPRAZolam (XANAX) 0.5 MG tablet, Take 1 tablet (0.5 mg total) by mouth as needed for anxiety., Disp: 35 tablet, Rfl: 0   amphetamine-dextroamphetamine (ADDERALL XR) 25 MG 24 hr capsule, Take 1 capsule by mouth every morning., Disp: 30 capsule, Rfl: 0   amphetamine-dextroamphetamine (ADDERALL XR) 25 MG 24 hr capsule, Take 1 capsule by mouth every morning., Disp: 30 capsule, Rfl: 0  amphetamine-dextroamphetamine (ADDERALL XR) 25 MG 24 hr capsule, Take 1 capsule by mouth every morning., Disp: 30 capsule, Rfl: 0   amphetamine-dextroamphetamine (ADDERALL) 10 MG tablet, Take 1 tablet (10 mg total) by mouth daily., Disp: 30 tablet, Rfl: 0   amphetamine-dextroamphetamine (ADDERALL) 15 MG tablet, Take 1 tablet by mouth daily. At lunch-when working long hours, Disp: 30 tablet, Rfl: 0   amphetamine-dextroamphetamine (ADDERALL) 15 MG tablet, Take 1 tablet by mouth daily., Disp: 30 tablet, Rfl: 0   amphetamine-dextroamphetamine (ADDERALL) 15 MG tablet, Take 1 tablet by mouth daily., Disp: 30 tablet, Rfl: 0   azelastine  (ASTELIN) 0.1 % nasal spray, Place 1 spray into both nostrils 2 (two) times daily. Use in each nostril as directed, Disp: 30 mL, Rfl: 2   cetirizine (ZYRTEC) 10 MG tablet, Take 1 tablet (10 mg total) by mouth daily., Disp: 30 tablet, Rfl: 0   cyclobenzaprine (FLEXERIL) 10 MG tablet, Take 0.5-1 tablets (5-10 mg total) by mouth at bedtime., Disp: 90 tablet, Rfl: 0   Olopatadine-Mometasone (RYALTRIS) 665-25 MCG/ACT SUSP, Place 1 spray into the nose 2 (two) times daily., Disp: 29 g, Rfl: 1  No Known Allergies  I personally reviewed active problem list, medication list, allergies, family history, social history, health maintenance with the patient/caregiver today.   ROS  Constitutional: Negative for fever or weight change.  Respiratory: Negative for cough and shortness of breath.   Cardiovascular: Negative for chest pain or palpitations.  Gastrointestinal: Negative for abdominal pain, no bowel changes.  Musculoskeletal: Negative for gait problem or joint swelling.  Skin: Negative for rash.  Neurological: Negative for dizziness or headache.  No other specific complaints in a complete review of systems (except as listed in HPI above).   Objective  Vitals:   05/18/22 0959  BP: 134/72  Pulse: 90  Resp: 16  SpO2: 98%  Weight: 199 lb (90.3 kg)  Height: 6\' 1"  (1.854 m)    Body mass index is 26.25 kg/m.  Physical Exam  Constitutional: Patient appears well-developed and well-nourished. Overweight.  No distress.  HEENT: head atraumatic, normocephalic, pupils equal and reactive to light,, neck supple Cardiovascular: Normal rate, regular rhythm and normal heart sounds.  No murmur heard. No BLE edema. Pulmonary/Chest: Effort normal and breath sounds normal. No respiratory distress. Abdominal: Soft.  There is no tenderness. Psychiatric: Patient has a normal mood and affect. behavior is normal. Judgment and thought content normal.   PHQ2/9:    05/18/2022    9:58 AM 03/23/2022   11:31 AM  02/13/2022    3:03 PM 11/15/2021    2:30 PM 08/24/2021    2:13 PM  Depression screen PHQ 2/9  Decreased Interest 0 0 0 0 0  Down, Depressed, Hopeless 0 0 0 0 0  PHQ - 2 Score 0 0 0 0 0  Altered sleeping 0 0 0 0   Tired, decreased energy 0 0 0 0   Change in appetite 0 0 0 0   Feeling bad or failure about yourself  0 0 0 0   Trouble concentrating 0 0 0 0   Moving slowly or fidgety/restless 0 0 0 0   Suicidal thoughts 0 0 0 0   PHQ-9 Score 0 0 0 0     phq 9 is negative   Fall Risk:    05/18/2022    9:58 AM 03/23/2022   11:30 AM 02/13/2022    3:03 PM 11/15/2021    2:30 PM 08/24/2021    2:13 PM  Fall Risk  Falls in the past year? 0 0 0 0 0  Number falls in past yr: 0  0 0 0  Injury with Fall? 0  0 0 0  Risk for fall due to : No Fall Risks No Fall Risks No Fall Risks No Fall Risks No Fall Risks  Follow up Falls prevention discussed Falls prevention discussed Falls prevention discussed Falls prevention discussed Falls prevention discussed      Functional Status Survey: Is the patient deaf or have difficulty hearing?: No Does the patient have difficulty seeing, even when wearing glasses/contacts?: No Does the patient have difficulty concentrating, remembering, or making decisions?: No Does the patient have difficulty walking or climbing stairs?: No Does the patient have difficulty dressing or bathing?: No Does the patient have difficulty doing errands alone such as visiting a doctor's office or shopping?: No    Assessment & Plan  1. Attention deficit hyperactivity disorder (ADHD), predominantly hyperactive type  - amphetamine-dextroamphetamine (ADDERALL XR) 25 MG 24 hr capsule; Take 1 capsule by mouth every morning.  Dispense: 30 capsule; Refill: 0 - amphetamine-dextroamphetamine (ADDERALL XR) 25 MG 24 hr capsule; Take 1 capsule by mouth every morning.  Dispense: 30 capsule; Refill: 0 - amphetamine-dextroamphetamine (ADDERALL XR) 25 MG 24 hr capsule; Take 1 capsule by mouth  every morning.  Dispense: 30 capsule; Refill: 0 - amphetamine-dextroamphetamine (ADDERALL) 15 MG tablet; Take 1 tablet by mouth daily. At lunch-when working long hours  Dispense: 30 tablet; Refill: 0 - amphetamine-dextroamphetamine (ADDERALL) 15 MG tablet; Take 1 tablet by mouth daily.  Dispense: 30 tablet; Refill: 0 - amphetamine-dextroamphetamine (ADDERALL) 15 MG tablet; Take 1 tablet by mouth daily.  Dispense: 30 tablet; Refill: 0  2. B12 deficiency  - cyanocobalamin ((VITAMIN B-12)) injection 1,000 mcg  3. Panic attack  - ALPRAZolam (XANAX) 0.5 MG tablet; Take 1 tablet (0.5 mg total) by mouth as needed for anxiety.  Dispense: 35 tablet; Refill: 0  4. Chronic bilateral low back pain without sciatica   Stable

## 2022-05-18 ENCOUNTER — Ambulatory Visit: Payer: BC Managed Care – PPO | Admitting: Family Medicine

## 2022-05-18 ENCOUNTER — Encounter: Payer: Self-pay | Admitting: Family Medicine

## 2022-05-18 VITALS — BP 134/72 | HR 90 | Resp 16 | Ht 73.0 in | Wt 199.0 lb

## 2022-05-18 DIAGNOSIS — F901 Attention-deficit hyperactivity disorder, predominantly hyperactive type: Secondary | ICD-10-CM

## 2022-05-18 DIAGNOSIS — F410 Panic disorder [episodic paroxysmal anxiety] without agoraphobia: Secondary | ICD-10-CM | POA: Diagnosis not present

## 2022-05-18 DIAGNOSIS — E538 Deficiency of other specified B group vitamins: Secondary | ICD-10-CM | POA: Diagnosis not present

## 2022-05-18 DIAGNOSIS — M545 Low back pain, unspecified: Secondary | ICD-10-CM

## 2022-05-18 DIAGNOSIS — G8929 Other chronic pain: Secondary | ICD-10-CM

## 2022-05-18 MED ORDER — AMPHETAMINE-DEXTROAMPHETAMINE 15 MG PO TABS: 15.0000 mg | ORAL_TABLET | Freq: Every day | ORAL | 0 refills | Status: DC

## 2022-05-18 MED ORDER — AMPHETAMINE-DEXTROAMPHET ER 25 MG PO CP24: 25.0000 mg | ORAL_CAPSULE | ORAL | 0 refills | Status: DC

## 2022-05-18 MED ORDER — CYANOCOBALAMIN 1000 MCG/ML IJ SOLN
1000.0000 ug | Freq: Once | INTRAMUSCULAR | Status: AC
  Administered 2022-05-18: 1000 ug via INTRAMUSCULAR

## 2022-05-18 MED ORDER — ALPRAZOLAM 0.5 MG PO TABS: 0.5000 mg | ORAL_TABLET | ORAL | 0 refills | Status: DC | PRN

## 2022-05-18 MED ORDER — AMPHETAMINE-DEXTROAMPHETAMINE 15 MG PO TABS: 1.0000 | ORAL_TABLET | Freq: Every day | ORAL | 0 refills | Status: DC

## 2022-08-18 NOTE — Progress Notes (Unsigned)
Name: William Rivers   MRN: [id number]    DOB: [birth year]   Date:08/18/2022       Progress Note  Subjective  Chief Complaint  Follow Up  HPI  ADHD: he is taking medication as prescribed The medication helps him focus and also calms his mind. He denies side effects of medication.He is aware that Adderal is a controlled medication and he cannot share, sale or misuse. He states it does not lasting as long, but we will continue current dose for now    Palpitation: he developed symptoms in 2021  he decreased caffeine intake and symptoms resolved, symptoms increased again earlier in 2022 we referred him to cardiology, he wore a holter monitor that showed PVC's he was given metoprolol but stopped taking it after a few days due to side effects " I didn't feel right", episodes are intermittent and not associated with chest pain or SOB. He has decreased caffeine intake and has improved since.   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 He is stable and does not want to see a therapist . Last fill of alprazolam was Dec based on controlled substance database    Varicose veins: brother and grandfather have same problems. He states he has noticed leg heaviness and the bulging is worse at the end of the day. He denies lower extremity edema. He wears compression stocking hoses occasionally . Discussed vascular surgeon but he wants to hold off for now. Unchanged    Chronic low back pain: pain is described as constant and dull, usually at the end of the day, feels more stiff than painful, he states stable. Used to have radiculitis but not recently , he stopped taking gabapentin,  but takes flexeril prn and he has enough at home.  Taking it at most twice per week. Unchanged   AR: he is using Astelin prn  also zyrtec prn and is doing okay at this time. . Currently no sneezing or nasal congestion   Vitamin B12: he continues to forget to take B12,  we will give him another infection today He feels tired   Patient Active Problem List   Diagnosis Date Noted   ADD (attention deficit disorder) 05/29/2015   Chronic LBP 05/29/2015   Lactose intolerance 05/29/2015   Panic attack 05/29/2015   Perennial allergic rhinitis with seasonal variation 05/29/2015   Lumbosacral radiculitis 05/29/2015    Past Surgical History:  Procedure Laterality Date   TONSILLECTOMY     VASECTOMY Bilateral 02/21/2018    Family History  Problem Relation Age of Onset   Anxiety disorder Mother     Social History   Tobacco Use   Smoking status: Never   Smokeless tobacco: Current    Types: Chew  Substance Use Topics   Alcohol use: Yes    Alcohol/week: 18.0 standard drinks of alcohol    Types: 6 Cans of beer, 12 Standard drinks or equivalent per week     Current Outpatient Medications:    ALPRAZolam (XANAX) 0.5 MG tablet, Take 1 tablet (0.5 mg total) by mouth as needed for anxiety., Disp: 35 tablet, Rfl: 0   amphetamine-dextroamphetamine (ADDERALL XR) 25 MG 24 hr capsule, Take 1 capsule by mouth every morning., Disp: 30 capsule, Rfl: 0   amphetamine-dextroamphetamine (ADDERALL XR) 25 MG 24 hr capsule, Take 1 capsule by mouth every morning., Disp: 30 capsule, Rfl: 0   amphetamine-dextroamphetamine (ADDERALL XR) 25 MG 24  hr capsule, Take 1 capsule by mouth every morning., Disp: 30 capsule, Rfl: 0   amphetamine-dextroamphetamine (ADDERALL) 15 MG tablet, Take 1 tablet by mouth daily. At [employer] working long hours, Disp: 30 tablet, Rfl: 0   amphetamine-dextroamphetamine (ADDERALL) 15 MG tablet, Take 1 tablet by mouth daily., Disp: 30 tablet, Rfl: 0   amphetamine-dextroamphetamine (ADDERALL) 15 MG tablet, Take 1 tablet by mouth daily., Disp: 30 tablet, Rfl: 0   azelastine (ASTELIN) 0.1 % nasal spray, Place 1 spray into both nostrils 2 (two) times daily. Use in each nostril as directed, Disp: 30 mL, Rfl: 2   cetirizine (ZYRTEC) 10 MG tablet, Take 1 tablet  (10 mg total) by mouth daily., Disp: 30 tablet, Rfl: 0   cyclobenzaprine (FLEXERIL) 10 MG tablet, Take 0.5-1 tablets (5-10 mg total) by mouth at bedtime., Disp: 90 tablet, Rfl: 0   Olopatadine-Mometasone (RYALTRIS) 665-25 MCG/ACT SUSP, Place 1 spray into the nose 2 (two) times daily., Disp: 29 g, Rfl: 1  No Known Allergies  I personally reviewed active problem list, medication list, allergies, family history, social history, health maintenance with the patient/caregiver today.   ROS  ***  Objective  There were no vitals filed for this visit.  There is no height or weight on file to calculate BMI.  Physical Exam ***  No results found for this or any previous visit (from the past 2160 hour(s)).   PHQ2/9:    05/18/2022    9:58 AM 03/23/2022   11:31 AM 02/13/2022    3:03 PM 11/15/2021    2:30 PM 08/24/2021    2:13 PM  Depression screen PHQ 2/9  Decreased Interest 0 0 0 0 0  Down, Depressed, Hopeless 0 0 0 0 0  PHQ - 2 Score 0 0 0 0 0  Altered sleeping 0 0 0 0   Tired, decreased energy 0 0 0 0   Change in appetite 0 0 0 0   Feeling bad or failure about yourself  0 0 0 0   Trouble concentrating 0 0 0 0   Moving slowly or fidgety/restless 0 0 0 0   Suicidal thoughts 0 0 0 0   PHQ-9 Score 0 0 0 0     phq 9 is {gen pos [id number]}   Fall Risk:    05/18/2022    9:58 AM 03/23/2022   11:30 AM 02/13/2022    3:03 PM 11/15/2021    2:30 PM 08/24/2021    2:13 PM  Fall Risk   Falls in the past year? 0 0 0 0 0  Number falls in past yr: 0  0 0 0  Injury with Fall? 0  0 0 0  Risk for fall due to : No Fall Risks No Fall Risks No Fall Risks No Fall Risks No Fall Risks  Follow up Falls prevention discussed Falls prevention discussed Falls prevention discussed Falls prevention discussed Falls prevention discussed      Functional Status Survey:      Assessment & Plan  *** There are no diagnoses linked to this encounter.

## 2022-08-21 ENCOUNTER — Ambulatory Visit: Payer: BC Managed Care – PPO | Admitting: Family Medicine

## 2022-08-21 ENCOUNTER — Encounter: Payer: Self-pay | Admitting: Family Medicine

## 2022-08-21 VITALS — BP 128/70 | HR 98 | Resp 16 | Ht 72.0 in | Wt 196.0 lb

## 2022-08-21 DIAGNOSIS — F411 Generalized anxiety disorder: Secondary | ICD-10-CM | POA: Diagnosis not present

## 2022-08-21 DIAGNOSIS — F410 Panic disorder [episodic paroxysmal anxiety] without agoraphobia: Secondary | ICD-10-CM

## 2022-08-21 DIAGNOSIS — J3089 Other allergic rhinitis: Secondary | ICD-10-CM

## 2022-08-21 DIAGNOSIS — M545 Low back pain, unspecified: Secondary | ICD-10-CM | POA: Diagnosis not present

## 2022-08-21 DIAGNOSIS — F901 Attention-deficit hyperactivity disorder, predominantly hyperactive type: Secondary | ICD-10-CM

## 2022-08-21 DIAGNOSIS — E538 Deficiency of other specified B group vitamins: Secondary | ICD-10-CM | POA: Diagnosis not present

## 2022-08-21 DIAGNOSIS — G8929 Other chronic pain: Secondary | ICD-10-CM

## 2022-08-21 DIAGNOSIS — J302 Other seasonal allergic rhinitis: Secondary | ICD-10-CM

## 2022-08-21 MED ORDER — ALPRAZOLAM 0.5 MG PO TABS: 0.5000 mg | ORAL_TABLET | ORAL | 0 refills | Status: DC | PRN

## 2022-08-21 MED ORDER — AMPHETAMINE-DEXTROAMPHET ER 25 MG PO CP24: 25.0000 mg | ORAL_CAPSULE | ORAL | 0 refills | Status: DC

## 2022-08-21 MED ORDER — AMPHETAMINE-DEXTROAMPHETAMINE 15 MG PO TABS: 1.0000 | ORAL_TABLET | Freq: Every day | ORAL | 0 refills | Status: DC

## 2022-08-21 MED ORDER — AMPHETAMINE-DEXTROAMPHETAMINE 15 MG PO TABS: 15.0000 mg | ORAL_TABLET | Freq: Every day | ORAL | 0 refills | Status: DC

## 2022-08-21 MED ORDER — CYANOCOBALAMIN 1000 MCG/ML IJ SOLN
1000.0000 ug | Freq: Once | INTRAMUSCULAR | Status: AC
  Administered 2022-08-21: 1000 ug via INTRAMUSCULAR

## 2022-08-21 MED ORDER — RYALTRIS 665-25 MCG/ACT NA SUSP: 1.0000 | Freq: Two times a day (BID) | NASAL | 1 refills | Status: DC

## 2022-11-24 NOTE — Progress Notes (Unsigned)
Name: William Rivers   MRN: [id number]    DOB: 07/28/1991   Date:11/27/2022       Progress Note  Subjective  Chief Complaint  Follow Up  HPI  ADHD: he is taking medication as prescribed The medication helps him focus and also calms his mind. He denies side effects of medication.He is aware that Adderal is a controlled medication and he cannot share, sale or misuse. He is still learning the new position, he prefers being on the field instead of doing paperwork    Palpitation: he developed symptoms in 2021  he decreased caffeine intake and symptoms resolved, symptoms increased again earlier in 2022 we referred him to cardiology, he wore a holter monitor that showed PVC's he was given metoprolol but stopped taking it after a few days due to side effects " I didn't feel right", episodes are intermittent and not associated with chest pain or SOB. 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 He is stable and does not want to see a therapist . Last fill of alprazolam was  3 months ago. Stable. He states he had COVID and could not catch his breath so he took some extra alprazolam's because it felt like a panic attack. Back to baseline now    Varicose veins: brother and grandfather have same problems. He states he has noticed leg heaviness and the bulging is worse at the end of the day. He denies lower extremity edema. He has not been wearing compression stocking hoses occasionally . Still not ready to see vascular surgeon    Chronic low back pain: pain is described as constant and dull, usually at the end of the day, feels more stiff than painful, he states stable. Used to have radiculitis but not recently , he stopped taking gabapentin,  but takes flexeril prn and he has enough at home Unchanged   AR: he is using Rialtris  also zyrtec prn and is doing okay at this time. He has some nasal congestion and rhinorrhea , felt like a  cold but feeling better now   Vitamin B12: he continues to forget to take B12, we will recheck labs and give him another B12 today,reminded him to come in monthly for injections   Patient Active Problem List   Diagnosis Date Noted   ADD (attention deficit disorder) 05/29/2015   Chronic LBP 05/29/2015   Lactose intolerance 05/29/2015   Panic attack 05/29/2015   Perennial allergic rhinitis with seasonal variation 05/29/2015   Lumbosacral radiculitis 05/29/2015    Past Surgical History:  Procedure Laterality Date   TONSILLECTOMY     VASECTOMY Bilateral 02/21/2018    Family History  Problem Relation Age of Onset   Anxiety disorder Mother     Social History   Tobacco Use   Smoking status: Never   Smokeless tobacco: Current    Types: Chew  Substance Use Topics   Alcohol use: Yes    Alcohol/week: 18.0 standard drinks of alcohol    Types: 6 Cans of beer, 12 Standard drinks or equivalent per week     Current Outpatient Medications:    ALPRAZolam (XANAX) 0.5 MG tablet, Take 1 tablet (0.5 mg total) by mouth as needed for anxiety., Disp: 35 tablet, Rfl: 0   amphetamine-dextroamphetamine (ADDERALL XR) 25 MG 24 hr capsule, Take 1 capsule by mouth every morning., Disp: 30 capsule, Rfl: 0   amphetamine-dextroamphetamine (ADDERALL XR)  25 MG 24 hr capsule, Take 1 capsule by mouth every morning., Disp: 30 capsule, Rfl: 0   amphetamine-dextroamphetamine (ADDERALL XR) 25 MG 24 hr capsule, Take 1 capsule by mouth every morning., Disp: 30 capsule, Rfl: 0   amphetamine-dextroamphetamine (ADDERALL) 15 MG tablet, Take 1 tablet by mouth daily. At lunch-when working long hours, Disp: 30 tablet, Rfl: 0   amphetamine-dextroamphetamine (ADDERALL) 15 MG tablet, Take 1 tablet by mouth daily., Disp: 30 tablet, Rfl: 0   amphetamine-dextroamphetamine (ADDERALL) 15 MG tablet, Take 1 tablet by mouth daily., Disp: 30 tablet, Rfl: 0   cetirizine (ZYRTEC) 10 MG tablet, Take 1 tablet (10 mg total) by mouth  daily., Disp: 30 tablet, Rfl: 0   cyclobenzaprine (FLEXERIL) 10 MG tablet, Take 0.5-1 tablets (5-10 mg total) by mouth at bedtime., Disp: 90 tablet, Rfl: 0   Olopatadine-Mometasone (RYALTRIS) 665-25 MCG/ACT SUSP, Place 1 spray into the nose 2 (two) times daily., Disp: 29 g, Rfl: 1  No Known Allergies  I personally reviewed active problem list, medication list, allergies, family history, social history, health maintenance with the patient/caregiver today.   ROS  Constitutional: Negative for fever or weight change.  Respiratory: Negative for cough and shortness of breath.   Cardiovascular: Negative for chest pain or palpitations.  Gastrointestinal: Negative for abdominal pain, no bowel changes.  Musculoskeletal: Negative for gait problem or joint swelling.  Skin: Negative for rash.  Neurological: Negative for dizziness or headache.  No other specific complaints in a complete review of systems (except as listed in HPI above).   Objective  Vitals:   11/27/22 1446  BP: 122/74  Pulse: 93  Resp: 16  SpO2: 99%  Weight: 207 lb (93.9 kg)  Height: 6\' 1"  (1.854 m)    Body mass index is 27.31 kg/m.  Physical Exam  Constitutional: Patient appears well-developed and well-nourished.  No distress.  HEENT: head atraumatic, normocephalic, pupils equal and reactive to light, neck supple Cardiovascular: Normal rate, regular rhythm and normal heart sounds.  No murmur heard. No BLE edema. Pulmonary/Chest: Effort normal and breath sounds normal. No respiratory distress. Abdominal: Soft.  There is no tenderness. Psychiatric: Patient has a normal mood and affect. behavior is normal. Judgment and thought content normal.   PHQ2/9:    11/27/2022    2:46 PM 08/21/2022    8:21 AM 05/18/2022    9:58 AM 03/23/2022   11:31 AM 02/13/2022    3:03 PM  Depression screen PHQ 2/9  Decreased Interest 0 0 0 0 0  Down, Depressed, Hopeless 0 0 0 0 0  PHQ - 2 Score 0 0 0 0 0  Altered sleeping 0 0 0 0 0  Tired,  decreased energy 0 0 0 0 0  Change in appetite 0 0 0 0 0  Feeling bad or failure about yourself  0 0 0 0 0  Trouble concentrating 0 0 0 0 0  Moving slowly or fidgety/restless 0 0 0 0 0  Suicidal thoughts 0 0 0 0 0  PHQ-9 Score 0 0 0 0 0    phq 9 is negative   Fall Risk:    11/27/2022    2:46 PM 08/21/2022    8:21 AM 05/18/2022    9:58 AM 03/23/2022   11:30 AM 02/13/2022    3:03 PM  Fall Risk   Falls in the past year? 0 0 0 0 0  Number falls in past yr: 0 0 0  0  Injury with Fall? 0 0 0  0  Risk for fall due to : No Fall Risks No Fall Risks No Fall Risks No Fall Risks No Fall Risks  Follow up Falls prevention discussed Falls prevention discussed Falls prevention discussed Falls prevention discussed Falls prevention discussed      Functional Status Survey: Is the patient deaf or have difficulty hearing?: No Does the patient have difficulty seeing, even when wearing glasses/contacts?: No Does the patient have difficulty concentrating, remembering, or making decisions?: No Does the patient have difficulty walking or climbing stairs?: No Does the patient have difficulty dressing or bathing?: No Does the patient have difficulty doing errands alone such as visiting a doctor's office or shopping?: No    Assessment & Plan  1. Attention deficit hyperactivity disorder (ADHD), predominantly hyperactive type  - amphetamine-dextroamphetamine (ADDERALL XR) 25 MG 24 hr capsule; Take 1 capsule by mouth every morning.  Dispense: 30 capsule; Refill: 0 - amphetamine-dextroamphetamine (ADDERALL XR) 25 MG 24 hr capsule; Take 1 capsule by mouth every morning.  Dispense: 30 capsule; Refill: 0 - amphetamine-dextroamphetamine (ADDERALL XR) 25 MG 24 hr capsule; Take 1 capsule by mouth every morning.  Dispense: 30 capsule; Refill: 0 - amphetamine-dextroamphetamine (ADDERALL) 15 MG tablet; Take 1 tablet by mouth daily. At [place] working long hours  Dispense: 30 tablet; Refill: 0 -  amphetamine-dextroamphetamine (ADDERALL) 15 MG tablet; Take 1 tablet by mouth daily.  Dispense: 30 tablet; Refill: 0 - amphetamine-dextroamphetamine (ADDERALL) 15 MG tablet; Take 1 tablet by mouth daily.  Dispense: 30 tablet; Refill: 0  2. B12 deficiency  - B12 and Folate Panel - CBC with Differential/Platelet  3. Panic attack  - ALPRAZolam (XANAX) 0.5 MG tablet; Take 1 tablet (0.5 mg total) by mouth as needed for anxiety.  Dispense: 35 tablet; Refill: 0  4. PVC (premature ventricular contraction)  Doing well at this time   5. Long-term use of high-risk medication  - COMPLETE METABOLIC PANEL WITH GFR    6. Perennial allergic rhinitis with seasonal variation  Continue current regiment   7. GAD (generalized anxiety disorder)

## 2022-11-27 ENCOUNTER — Encounter: Payer: Self-pay | Admitting: Family Medicine

## 2022-11-27 ENCOUNTER — Ambulatory Visit: Payer: BC Managed Care – PPO | Admitting: Family Medicine

## 2022-11-27 VITALS — BP 122/74 | HR 93 | Resp 16 | Ht 73.0 in | Wt 207.0 lb

## 2022-11-27 DIAGNOSIS — Z79899 Other long term (current) drug therapy: Secondary | ICD-10-CM

## 2022-11-27 DIAGNOSIS — J302 Other seasonal allergic rhinitis: Secondary | ICD-10-CM

## 2022-11-27 DIAGNOSIS — J3089 Other allergic rhinitis: Secondary | ICD-10-CM

## 2022-11-27 DIAGNOSIS — F410 Panic disorder [episodic paroxysmal anxiety] without agoraphobia: Secondary | ICD-10-CM | POA: Diagnosis not present

## 2022-11-27 DIAGNOSIS — E538 Deficiency of other specified B group vitamins: Secondary | ICD-10-CM

## 2022-11-27 DIAGNOSIS — F901 Attention-deficit hyperactivity disorder, predominantly hyperactive type: Secondary | ICD-10-CM

## 2022-11-27 DIAGNOSIS — I493 Ventricular premature depolarization: Secondary | ICD-10-CM

## 2022-11-27 DIAGNOSIS — F411 Generalized anxiety disorder: Secondary | ICD-10-CM

## 2022-11-27 MED ORDER — ALPRAZOLAM 0.5 MG PO TABS: 0.5000 mg | ORAL_TABLET | ORAL | 0 refills | Status: DC | PRN

## 2022-11-27 MED ORDER — AMPHETAMINE-DEXTROAMPHET ER 25 MG PO CP24: 25.0000 mg | ORAL_CAPSULE | ORAL | 0 refills | Status: DC

## 2022-11-27 MED ORDER — CYANOCOBALAMIN 1000 MCG/ML IJ SOLN
1000.0000 ug | Freq: Once | INTRAMUSCULAR | Status: AC
  Administered 2022-11-27: 1000 ug via INTRAMUSCULAR

## 2022-11-27 MED ORDER — AMPHETAMINE-DEXTROAMPHETAMINE 15 MG PO TABS: 15.0000 mg | ORAL_TABLET | Freq: Every day | ORAL | 0 refills | Status: DC

## 2022-11-27 MED ORDER — AMPHETAMINE-DEXTROAMPHETAMINE 15 MG PO TABS: 1.0000 | ORAL_TABLET | Freq: Every day | ORAL | 0 refills | Status: DC

## 2022-11-28 LAB — COMPLETE METABOLIC PANEL WITH GFR
AG Ratio: 2.2 (calc) (ref 1.0–2.5)
ALT: 11 U/L (ref 9–46)
AST: 12 U/L (ref 10–40)
Albumin: 4.6 g/dL (ref 3.6–5.1)
Alkaline phosphatase (APISO): 65 U/L (ref 36–130)
BUN: 14 mg/dL (ref 7–25)
CO2: 29 mmol/L (ref 20–32)
Calcium: 9.3 mg/dL (ref 8.6–10.3)
Chloride: 105 mmol/L (ref 98–110)
Creat: 1.11 mg/dL (ref 0.60–1.26)
Globulin: 2.1 g/dL (calc) (ref 1.9–3.7)
Glucose, Bld: 83 mg/dL (ref 65–99)
Potassium: 4.1 mmol/L (ref 3.5–5.3)
Sodium: 141 mmol/L (ref 135–146)
Total Bilirubin: 0.3 mg/dL (ref 0.2–1.2)
Total Protein: 6.7 g/dL (ref 6.1–8.1)
eGFR: 90 mL/min/{1.73_m2} (ref >=60)

## 2022-11-28 LAB — CBC WITH DIFFERENTIAL/PLATELET
Absolute Monocytes: 933 cells/uL (ref 200–950)
Basophils Absolute: 32 cells/uL (ref 0–200)
Basophils Relative: 0.6 %
Eosinophils Absolute: 80 cells/uL (ref 15–500)
Eosinophils Relative: 1.5 %
HCT: 41.4 % (ref 38.5–50.0)
Hemoglobin: 14.8 g/dL (ref 13.2–17.1)
Lymphs Abs: 742 cells/uL — ABNORMAL LOW (ref 850–3900)
MCH: 31.7 pg (ref 27.0–33.0)
MCHC: 35.7 g/dL (ref 32.0–36.0)
MCV: 88.7 fL (ref 80.0–100.0)
MPV: 10.6 fL (ref 7.5–12.5)
Monocytes Relative: 17.6 %
Neutro Abs: 3514 cells/uL (ref 1500–7800)
Neutrophils Relative %: 66.3 %
Platelets: 229 10*3/uL (ref 140–400)
RBC: 4.67 10*6/uL (ref 4.20–5.80)
RDW: 11.9 % (ref 11.0–15.0)
Total Lymphocyte: 14 %
WBC: 5.3 10*3/uL (ref 3.8–10.8)

## 2022-11-28 LAB — B12 AND FOLATE PANEL
Folate: 12.1 ng/mL
Vitamin B-12: 275 pg/mL (ref 200–1100)

## 2022-12-06 ENCOUNTER — Telehealth (INDEPENDENT_AMBULATORY_CARE_PROVIDER_SITE_OTHER): Payer: BC Managed Care – PPO | Admitting: Physician Assistant

## 2022-12-06 DIAGNOSIS — J069 Acute upper respiratory infection, unspecified: Secondary | ICD-10-CM | POA: Diagnosis not present

## 2022-12-06 NOTE — Patient Instructions (Addendum)
  Based on your described symptoms and the duration of symptoms it is likely that you have a viral upper respiratory infection (often called a "cold")  Symptoms can last for 3-10 days with lingering cough and intermittent symptoms lasting weeks after that.  The goal of treatment at this time is to reduce your symptoms and discomfort    You can use over the counter medications such as Dayquil/Nyquil, AlkaSeltzer formulations, etc to provide further relief of symptoms according to the manufacturer's instructions   You can use a humidifier and nasal saline sprays for further relief I recommend making sure that your chosen medication regimen has Mucinex as this will help thin out the mucus in your sinuses so it can be more easily removed Be sure to drink plenty of water and rest as you are able    If your symptoms do not improve or become worse in the next 5-7 days please make an apt at the office so we can see you  Go to the ER if you begin to have more serious symptoms such as shortness of breath, trouble breathing, loss of consciousness, swelling around the eyes, high fever, severe lasting headaches, vision changes or neck pain/stiffness.

## 2022-12-06 NOTE — Progress Notes (Signed)
Virtual Visit via Video Note  I connected with William Rivers on 12/06/22 at 11:20 AM EST by a video enabled telemedicine application and verified that I am speaking with the correct person using two identifiers. Today's Provider: Talitha Givens, MHS, PA-C Introduced myself to the patient as a PA-C and provided education on APPs in clinical practice.   Location: Patient: at home  Provider: [place], [place]    I discussed the limitations of evaluation and management by telemedicine and the availability of in person appointments. The patient expressed understanding and agreed to proceed.   Chief Complaint  Patient presents with   Sinusitis    Facial pressure, congestion, headache onset for 4 days     History of Present Illness:   URI-Type symptoms  Onset: sudden  Duration: 4 days  Reports teeth pain, nasal congestion, sinus pressure and pain, mild sore throat He denies fever, cough, SOB  Interventions: Ibuprofen, Sudafed - minimal relief  COVID testing at home: he has not tested for COVID- reports he had COVID in Nov   Results: NA  Sick contacts: denies recent sick contacts to his knowledge     Review of Systems  Constitutional:  Positive for malaise/fatigue. Negative for chills and fever.  HENT:  Positive for congestion, sinus pain and sore throat. Negative for ear pain.   Respiratory:  Negative for cough, shortness of breath and wheezing.   Gastrointestinal:  Negative for abdominal pain, diarrhea, nausea and vomiting.  Musculoskeletal:  Negative for myalgias.  Neurological:  Negative for dizziness and headaches.      Observations/Objective:   Due to the nature of the virtual visit, physical exam and observations are limited. Able to obtain the following observations:   Alert, oriented, Appears comfortable, in no acute distress.  No scleral injection, no appreciated hoarseness, tachypnea, wheeze or strider. Able to maintain  conversation without visible strain.  No cough appreciated during visit.     Outpatient Encounter Medications as of 12/06/2022  Medication Sig   ALPRAZolam (XANAX) 0.5 MG tablet Take 1 tablet (0.5 mg total) by mouth as needed for anxiety.   amphetamine-dextroamphetamine (ADDERALL XR) 25 MG 24 hr capsule Take 1 capsule by mouth every morning.   amphetamine-dextroamphetamine (ADDERALL XR) 25 MG 24 hr capsule Take 1 capsule by mouth every morning.   amphetamine-dextroamphetamine (ADDERALL XR) 25 MG 24 hr capsule Take 1 capsule by mouth every morning.   amphetamine-dextroamphetamine (ADDERALL) 15 MG tablet Take 1 tablet by mouth daily. At [place] working long hours   amphetamine-dextroamphetamine (ADDERALL) 15 MG tablet Take 1 tablet by mouth daily.   amphetamine-dextroamphetamine (ADDERALL) 15 MG tablet Take 1 tablet by mouth daily.   cetirizine (ZYRTEC) 10 MG tablet Take 1 tablet (10 mg total) by mouth daily.   cyclobenzaprine (FLEXERIL) 10 MG tablet Take 0.5-1 tablets (5-10 mg total) by mouth at bedtime.   Olopatadine-Mometasone (RYALTRIS) G7528004 MCG/ACT SUSP Place 1 spray into the nose 2 (two) times daily.   No facility-administered encounter medications on file as of 12/06/2022.      Assessment and Plan:    Problem List Items Addressed This Visit   None Visit Diagnoses     Upper respiratory tract infection, unspecified type    -  Primary Acute, new concern Visit with patient indicates symptoms nasal congestion, sinus pain and pressure, fatigue, mild sore throat for 4 days  congruent with acute URI that is likely viral in nature  Unable to test patient for COVID or  Flu due to virtual visit Due to nature and duration of symptoms recommended treatment regimen is symptomatic relief and follow up if needed Discussed with patient the various viral and bacterial etiologies of current illness and appropriate course of treatment Discussed OTC medication options for multisymptom relief  such as Dayquil/Nyquil, Theraflu, AlkaSeltzer, etc. Discussed return precautions if symptoms are not improving or worsen over next 5-7 days.        Follow Up Instructions:    I discussed the assessment and treatment plan with the patient. The patient was provided an opportunity to ask questions and all were answered. The patient agreed with the plan and demonstrated an understanding of the instructions.   The patient was advised to call back or seek an in-person evaluation if the symptoms worsen or if the condition fails to improve as anticipated.  I provided 9 minutes of non-face-to-face time during this encounter.  No follow-ups on file.   I, Cevin Rubinstein E Ginamarie Banfield, PA-C, have reviewed all documentation for this visit. The documentation on 12/06/22 for the exam, diagnosis, procedures, and orders are all accurate and complete.   Talitha Givens, MHS, PA-C [facility]

## 2023-01-18 ENCOUNTER — Telehealth: Payer: BC Managed Care – PPO | Admitting: Nurse Practitioner

## 2023-01-18 DIAGNOSIS — J0140 Acute pansinusitis, unspecified: Secondary | ICD-10-CM

## 2023-01-18 MED ORDER — AMOXICILLIN-POT CLAVULANATE 875-125 MG PO TABS: 1.0000 | ORAL_TABLET | Freq: Two times a day (BID) | ORAL | 0 refills | Status: AC

## 2023-01-18 NOTE — Progress Notes (Signed)
E-Visit for Sinus Problems  We are sorry that you are not feeling well.  Here is how we plan to help!  Based on what you have shared with me it looks like you have sinusitis.  Sinusitis is inflammation and infection in the sinus cavities of the head.  Based on your presentation I believe you most likely have Acute Bacterial Sinusitis.  This is an infection caused by bacteria and is treated with antibiotics. I have prescribed Augmentin '875mg'$ /'125mg'$  one tablet twice daily with food, for 7 days. You may use an oral decongestant such as Mucinex D or if you have glaucoma or high blood pressure use plain Mucinex. Saline nasal spray help and can safely be used as often as needed for congestion.  If you develop worsening sinus pain, fever or notice severe headache and vision changes, or if symptoms are not better after completion of antibiotic, please schedule an appointment with a health care provider.    Sinus infections are not as easily transmitted as other respiratory infection, however we still recommend that you avoid close contact with loved ones, especially the very young and elderly.  Remember to wash your hands thoroughly throughout the day as this is the number one way to prevent the spread of infection!  Home Care: Only take medications as instructed by your medical team. Complete the entire course of an antibiotic. Do not take these medications with alcohol. A steam or ultrasonic humidifier can help congestion.  You can place a towel over your head and breathe in the steam from hot water coming from a faucet. Avoid close contacts especially the very young and the elderly. Cover your mouth when you cough or sneeze. Always remember to wash your hands.  Get Help Right Away If: You develop worsening fever or sinus pain. You develop a severe head ache or visual changes. Your symptoms persist after you have completed your treatment plan.  Make sure you Understand these instructions. Will watch  your condition. Will get help right away if you are not doing well or get worse.  Thank you for choosing an e-visit.  Your e-visit answers were reviewed by a board certified advanced clinical practitioner to complete your personal care plan. Depending upon the condition, your plan could have included both over the counter or prescription medications.  Please review your pharmacy choice. Make sure the pharmacy is open so you can pick up prescription now. If there is a problem, you may contact your provider through [employer] and have the prescription routed to another pharmacy.  Your safety is important to [place]. If you have drug allergies check your prescription carefully.   For the next 24 hours you can use MyChart to ask questions about today's visit, request a non-urgent call back, or ask for a work or school excuse. You will get an email in the next two days asking about your experience. I hope that your e-visit has been valuable and will speed your recovery.   Meds ordered this encounter  Medications   amoxicillin-clavulanate (AUGMENTIN) 875-125 MG tablet    Sig: Take 1 tablet by mouth 2 (two) times daily for 7 days. Take with food    Dispense:  14 tablet    Refill:  0     I spent approximately 5 minutes reviewing the patient's history, current symptoms and coordinating their care today.

## 2023-02-27 NOTE — Progress Notes (Unsigned)
Name: William Rivers   MRN: [id number]    DOB: 07/24/1991   Date:02/27/2023       Progress Note  Subjective  Chief Complaint  Follow Up  HPI  ADHD: he is taking medication as prescribed The medication helps him focus and also calms his mind. He denies side effects of medication.He is aware that Adderal is a controlled medication and he cannot share, sale or misuse. He is still learning the new position, he prefers being on the field instead of doing paperwork    Palpitation: he developed symptoms in 2021  he decreased caffeine intake and symptoms resolved, symptoms increased again earlier in 2022 we referred him to cardiology, he wore a holter monitor that showed PVC's he was given metoprolol but stopped taking it after a few days due to side effects " I didn't feel right", episodes are intermittent and not associated with chest pain or SOB. 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 He is stable and does not want to see a therapist . Last fill of alprazolam was  3 months ago. Stable. He states he had COVID and could not catch his breath so he took some extra alprazolam's because it felt like a panic attack. Back to baseline now    Varicose veins: brother and grandfather have same problems. He states he has noticed leg heaviness and the bulging is worse at the end of the day. He denies lower extremity edema. He has not been wearing compression stocking hoses occasionally . Still not ready to see vascular surgeon    Chronic low back pain: pain is described as constant and dull, usually at the end of the day, feels more stiff than painful, he states stable. Used to have radiculitis but not recently , he stopped taking gabapentin,  but takes flexeril prn and he has enough at home Unchanged   AR: he is using Rialtris  also zyrtec prn and is doing okay at this time. He has some nasal congestion and rhinorrhea , felt like a  cold but feeling better now   Vitamin B12: he continues to forget to take B12, we will recheck labs and give him another B12 today,reminded him to come in monthly for injections   Patient Active Problem List   Diagnosis Date Noted   B12 deficiency 11/27/2022   PVC (premature ventricular contraction) 11/27/2022   ADD (attention deficit disorder) 05/29/2015   Chronic LBP 05/29/2015   Lactose intolerance 05/29/2015   Panic attack 05/29/2015   Perennial allergic rhinitis with seasonal variation 05/29/2015   Lumbosacral radiculitis 05/29/2015    Past Surgical History:  Procedure Laterality Date   TONSILLECTOMY     VASECTOMY Bilateral 02/21/2018    Family History  Problem Relation Age of Onset   Anxiety disorder Mother     Social History   Tobacco Use   Smoking status: Never   Smokeless tobacco: Current    Types: Chew  Substance Use Topics   Alcohol use: Yes    Alcohol/week: 18.0 standard drinks of alcohol    Types: 6 Cans of beer, 12 Standard drinks or equivalent per week     Current Outpatient Medications:    ALPRAZolam (XANAX) 0.5 MG tablet, Take 1 tablet (0.5 mg total) by mouth as needed for anxiety., Disp: 35 tablet, Rfl: 0   amphetamine-dextroamphetamine (ADDERALL XR) 25 MG 24 hr capsule, Take 1 capsule by mouth  every morning., Disp: 30 capsule, Rfl: 0   amphetamine-dextroamphetamine (ADDERALL XR) 25 MG 24 hr capsule, Take 1 capsule by mouth every morning., Disp: 30 capsule, Rfl: 0   amphetamine-dextroamphetamine (ADDERALL XR) 25 MG 24 hr capsule, Take 1 capsule by mouth every morning., Disp: 30 capsule, Rfl: 0   amphetamine-dextroamphetamine (ADDERALL) 15 MG tablet, Take 1 tablet by mouth daily. At lunch-when working long hours, Disp: 30 tablet, Rfl: 0   amphetamine-dextroamphetamine (ADDERALL) 15 MG tablet, Take 1 tablet by mouth daily., Disp: 30 tablet, Rfl: 0   amphetamine-dextroamphetamine (ADDERALL) 15 MG tablet, Take 1 tablet by mouth daily., Disp: 30 tablet,  Rfl: 0   cetirizine (ZYRTEC) 10 MG tablet, Take 1 tablet (10 mg total) by mouth daily., Disp: 30 tablet, Rfl: 0   cyclobenzaprine (FLEXERIL) 10 MG tablet, Take 0.5-1 tablets (5-10 mg total) by mouth at bedtime., Disp: 90 tablet, Rfl: 0   Olopatadine-Mometasone (RYALTRIS) 665-25 MCG/ACT SUSP, Place 1 spray into the nose 2 (two) times daily., Disp: 29 g, Rfl: 1  No Known Allergies  I personally reviewed active problem list, medication list, allergies, family history, social history, health maintenance with the patient/caregiver today.   ROS  ***  Objective  There were no vitals filed for this visit.  There is no height or weight on file to calculate BMI.  Physical Exam ***  No results found for this or any previous visit (from the past 2160 hour(s)).   PHQ2/9:    12/06/2022   10:27 AM 11/27/2022    2:46 PM 08/21/2022    8:21 AM 05/18/2022    9:58 AM 03/23/2022   11:31 AM  Depression screen PHQ 2/9  Decreased Interest 0 0 0 0 0  Down, Depressed, Hopeless 0 0 0 0 0  PHQ - 2 Score 0 0 0 0 0  Altered sleeping 0 0 0 0 0  Tired, decreased energy 0 0 0 0 0  Change in appetite 0 0 0 0 0  Feeling bad or failure about yourself  0 0 0 0 0  Trouble concentrating 0 0 0 0 0  Moving slowly or fidgety/restless 0 0 0 0 0  Suicidal thoughts 0 0 0 0 0  PHQ-9 Score 0 0 0 0 0  Difficult doing work/chores Not difficult at all        phq 9 is {gen pos [id number]}   Fall Risk:    12/06/2022   10:27 AM 11/27/2022    2:46 PM 08/21/2022    8:21 AM 05/18/2022    9:58 AM 03/23/2022   11:30 AM  Fall Risk   Falls in the past year? 0 0 0 0 0  Number falls in past yr: 0 0 0 0   Injury with Fall? 0 0 0 0   Risk for fall due to : No Fall Risks No Fall Risks No Fall Risks No Fall Risks No Fall Risks  Follow up Falls prevention discussed;Falls evaluation completed;Education provided Falls prevention discussed Falls prevention discussed Falls prevention discussed Falls prevention discussed       Functional Status Survey:      Assessment & Plan  *** There are no diagnoses linked to this encounter.

## 2023-02-28 ENCOUNTER — Ambulatory Visit: Payer: BC Managed Care – PPO | Admitting: Family Medicine

## 2023-02-28 ENCOUNTER — Encounter: Payer: Self-pay | Admitting: Family Medicine

## 2023-02-28 ENCOUNTER — Other Ambulatory Visit (HOSPITAL_COMMUNITY)
Admission: RE | Admit: 2023-02-28 | Discharge: 2023-02-28 | Disposition: A | Discharge status: Home / Self Care | Payer: BC Managed Care – PPO | Source: Ambulatory Visit | Attending: Family Medicine | Admitting: Family Medicine

## 2023-02-28 VITALS — BP 120/72 | HR 86 | Temp 98.2°F | Resp 14 | Ht 73.0 in | Wt 203.8 lb

## 2023-02-28 DIAGNOSIS — E538 Deficiency of other specified B group vitamins: Secondary | ICD-10-CM

## 2023-02-28 DIAGNOSIS — F901 Attention-deficit hyperactivity disorder, predominantly hyperactive type: Secondary | ICD-10-CM

## 2023-02-28 DIAGNOSIS — R8290 Unspecified abnormal findings in urine: Secondary | ICD-10-CM

## 2023-02-28 DIAGNOSIS — J301 Allergic rhinitis due to pollen: Secondary | ICD-10-CM | POA: Diagnosis not present

## 2023-02-28 DIAGNOSIS — R300 Dysuria: Secondary | ICD-10-CM | POA: Diagnosis not present

## 2023-02-28 DIAGNOSIS — F410 Panic disorder [episodic paroxysmal anxiety] without agoraphobia: Secondary | ICD-10-CM | POA: Diagnosis not present

## 2023-02-28 LAB — POCT URINALYSIS DIPSTICK (MANUAL)
Leukocytes, UA: NEGATIVE
Nitrite, UA: NEGATIVE
Poct Bilirubin: NEGATIVE
Poct Blood: NEGATIVE
Poct Glucose: NORMAL mg/dL
Poct Ketones: NEGATIVE
Poct Protein: NEGATIVE mg/dL
Poct Urobilinogen: NORMAL mg/dL
Spec Grav, UA: 1.015 (ref 1.010–1.025)
pH, UA: 5 (ref 5.0–8.0)

## 2023-02-28 MED ORDER — DOXYCYCLINE HYCLATE 100 MG PO TABS
100.0000 mg | ORAL_TABLET | Freq: Two times a day (BID) | ORAL | 0 refills | Status: DC
Start: 2023-02-28 — End: 2023-03-06

## 2023-02-28 MED ORDER — AMPHETAMINE-DEXTROAMPHET ER 25 MG PO CP24: 25.0000 mg | ORAL_CAPSULE | ORAL | 0 refills | Status: DC

## 2023-02-28 MED ORDER — AMPHETAMINE-DEXTROAMPHETAMINE 15 MG PO TABS: 15.0000 mg | ORAL_TABLET | Freq: Every day | ORAL | 0 refills | Status: DC

## 2023-02-28 MED ORDER — CYANOCOBALAMIN 1000 MCG/ML IJ SOLN
1000.0000 ug | Freq: Once | INTRAMUSCULAR | Status: AC
Start: 2023-02-28 — End: 2023-02-28
  Administered 2023-02-28: 1000 ug via INTRAMUSCULAR

## 2023-02-28 MED ORDER — ALPRAZOLAM 0.5 MG PO TABS
0.5000 mg | ORAL_TABLET | ORAL | 0 refills | Status: DC | PRN
Start: 2023-02-28 — End: 2023-05-09

## 2023-02-28 MED ORDER — CETIRIZINE HCL 10 MG PO TABS: 10.0000 mg | ORAL_TABLET | Freq: Every day | ORAL | 0 refills | Status: DC

## 2023-02-28 MED ORDER — AMPHETAMINE-DEXTROAMPHETAMINE 15 MG PO TABS
1.0000 | ORAL_TABLET | Freq: Every day | ORAL | 0 refills | Status: DC
Start: 2023-02-28 — End: 2023-05-09

## 2023-02-28 NOTE — Addendum Note (Signed)
Addended by: Benay Pike on: 02/28/2023 03:42 PM   Modules accepted: Orders

## 2023-02-28 NOTE — Addendum Note (Signed)
Addended by: Dollene Primrose on: 02/28/2023 03:31 PM   Modules accepted: Orders

## 2023-03-02 LAB — URINE CYTOLOGY ANCILLARY ONLY
Bacterial Vaginitis-Urine: NEGATIVE
Candida Urine: NEGATIVE
Chlamydia: NEGATIVE
Comment: NEGATIVE
Comment: NEGATIVE
Comment: NORMAL
Neisseria Gonorrhea: NEGATIVE
Trichomonas: NEGATIVE

## 2023-03-02 LAB — CULTURE, URINE COMPREHENSIVE
MICRO NUMBER:: 14811756
RESULT:: NO GROWTH
SPECIMEN QUALITY:: ADEQUATE

## 2023-03-06 ENCOUNTER — Telehealth: Payer: Self-pay | Admitting: Family Medicine

## 2023-03-06 ENCOUNTER — Other Ambulatory Visit: Payer: Self-pay | Admitting: Family Medicine

## 2023-03-06 MED ORDER — DOXYCYCLINE HYCLATE 100 MG PO TABS: 100.0000 mg | ORAL_TABLET | Freq: Two times a day (BID) | ORAL | 0 refills | Status: DC

## 2023-03-06 NOTE — Telephone Encounter (Signed)
Pt states he was given a RX,doxcycline for bladder infection and he got it filled and lost the bottle of meds. Pt is asking if another prescription could be called into [facility]. Please advise pt.

## 2023-05-08 NOTE — Progress Notes (Unsigned)
Name: William Rivers   MRN: [id number]    DOB: [birth year]   Date:05/09/2023       Progress Note  Subjective  Chief Complaint  Follow Up  HPI  ADHD: he is taking medication as prescribed The medication helps him focus and also calms his mind. He denies side effects of medication.He is aware that Adderal is a controlled medication and he cannot share, sale or misuse. 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 He is stable and does not want to see a therapist .He would like a refill    Varicose veins: brother and grandfather have same problems. He states he has noticed leg heaviness and the bulging is worse at the end of the day. He denies lower extremity edema. He has not been wearing compression stocking hoses occasionally . Still not ready to see vascular surgeon Unchanged    Chronic low back pain: pain is described as constant and dull, usually at the end of the day, feels more stiff than painful, he states stable. Used to have radiculitis but not recently , he stopped taking gabapentin,  but takes flexeril prn and he has enough at home Stable   AR: he is using Rialtris  prn and needs a refill of Zyrtec. Symptoms are seasonal , noticing sneezing and rhinorrhea, sometimes headache   Vitamin B12: he continues to forget to take B12, last level was low, we will give him a B12 injection today  Urine odor and occasional dysuria: only one partner, no penile discharge. He noticed symptoms over the past couple of weeks. No penile  rashes. No fever or chills but also has mild low back pain   Patient Active Problem List   Diagnosis Date Noted   B12 deficiency 11/27/2022   PVC (premature ventricular contraction) 11/27/2022   ADD (attention deficit disorder) 05/29/2015   Chronic LBP 05/29/2015   Lactose intolerance 05/29/2015   Panic attack 05/29/2015   Perennial allergic rhinitis with seasonal variation 05/29/2015   Lumbosacral radiculitis 05/29/2015    Past Surgical History:  Procedure Laterality Date   TONSILLECTOMY     VASECTOMY Bilateral 02/21/2018    Family History  Problem Relation Age of Onset   Anxiety disorder Mother     Social History   Tobacco Use   Smoking status: Never   Smokeless tobacco: Current    Types: Chew  Substance Use Topics   Alcohol use: Yes    Alcohol/week: 18.0 standard drinks of alcohol    Types: 6 Cans of beer, 12 Standard drinks or equivalent per week     Current Outpatient Medications:    cetirizine (ZYRTEC) 10 MG tablet, Take 1 tablet (10 mg total) by mouth daily., Disp: 90 tablet, Rfl: 0   cyclobenzaprine (FLEXERIL) 10 MG tablet, Take 0.5-1 tablets (5-10 mg total) by mouth at bedtime., Disp: 90 tablet, Rfl: 0   Olopatadine-Mometasone (RYALTRIS) 665-25 MCG/ACT SUSP, Place 1 spray into the nose 2 (two) times daily., Disp: 29 g, Rfl: 1  ALPRAZolam (XANAX) 0.5 MG tablet, Take 1 tablet (0.5 mg total) by mouth as needed for anxiety., Disp: 35 tablet, Rfl: 0   amphetamine-dextroamphetamine (ADDERALL XR) 25 MG 24 hr capsule, Take 1 capsule by mouth every morning., Disp: 30 capsule, Rfl: 0   amphetamine-dextroamphetamine (ADDERALL XR) 25 MG 24 hr capsule, Take 1 capsule by mouth every morning., Disp: 30 capsule, Rfl: 0   amphetamine-dextroamphetamine (ADDERALL XR) 25 MG 24 hr capsule, Take 1 capsule by mouth every morning., Disp: 30 capsule, Rfl: 0   amphetamine-dextroamphetamine (ADDERALL) 15 MG tablet, Take 1 tablet by mouth daily. At lunch-when working long hours, Disp: 30  tablet, Rfl: 0   amphetamine-dextroamphetamine (ADDERALL) 15 MG tablet, Take 1 tablet by mouth daily., Disp: 30 tablet, Rfl: 0   amphetamine-dextroamphetamine (ADDERALL) 15 MG tablet, Take 1 tablet by mouth daily., Disp: 30 tablet, Rfl: 0  No Known Allergies  I personally reviewed active problem list, medication list, allergies, family history, social history, health maintenance with the patient/caregiver today.   ROS  Ten systems reviewed and is negative except as mentioned in HPI   Objective  Vitals:   05/09/23 1420  BP: 118/70  Pulse: 98  Resp: 16  Temp: 97.9 F (36.6 C)  TempSrc: Oral  SpO2: 99%  Weight: 199 lb 4.8 oz (90.4 kg)  Height: 6\' 1"  (1.854 m)    Body mass index is 26.29 kg/m.  Physical Exam  Constitutional: Patient appears well-developed and well-nourished.  No distress.  HEENT: head atraumatic, normocephalic, pupils equal and reactive to light, neck supple Cardiovascular: Normal rate, regular rhythm and normal heart sounds.  No murmur heard. No BLE edema. Pulmonary/Chest: Effort normal and breath sounds normal. No respiratory distress. Abdominal: Soft.  There is no tenderness. Psychiatric: Patient has a normal mood and affect. behavior is normal. Judgment and thought content normal.   Recent Results (from the past 2160 hour(s))  Urine cytology ancillary only     Status: None   Collection Time: 02/28/23  3:31 PM  Result Value Ref Range   Neisseria Gonorrhea Negative    Chlamydia Negative    Trichomonas Negative    Bacterial Vaginitis-Urine Negative    Candida Urine Negative    Molecular Comment      For tests bacteria and/or candida, this specimen does not meet the   Molecular Comment      strict criteria set by the FDA. The result interpretation should be   Molecular Comment      considered in conjunction with the patient's clinical history.   Comment Normal Reference Range Trichomonas - Negative    Comment Normal Reference Ranger Chlamydia -  Negative    Comment      Normal Reference Range Neisseria Gonorrhea - Negative  POCT Urinalysis Dip Manual     Status: Normal   Collection Time: 02/28/23  3:42 PM  Result Value Ref Range   Spec Grav, UA 1.015 1.010 - 1.025   pH, UA 5.0 5.0 - 8.0   Leukocytes, UA Negative Negative   Nitrite, UA Negative Negative   Poct Protein Negative Negative, trace mg/dL   Poct Glucose Normal Normal mg/dL   Poct Ketones Negative Negative   Poct Urobilinogen Normal Normal mg/dL   Poct Bilirubin Negative Negative   Poct Blood Negative Negative, trace  CULTURE, URINE COMPREHENSIVE     Status: None   Collection Time: 02/28/23  3:45 PM   Specimen: Urine  Result Value Ref Range   MICRO NUMBER: [id number]    SPECIMEN QUALITY:  Adequate    Source OTHER (SPECIFY)    STATUS: FINAL    RESULT: No Growth     PHQ2/9:    05/09/2023    2:20 PM 02/28/2023    3:01 PM 12/06/2022   10:27 AM 11/27/2022    2:46 PM 08/21/2022    8:21 AM  Depression screen PHQ 2/9  Decreased Interest 0 0 0 0 0  Down, Depressed, Hopeless 0 0 0 0 0  PHQ - 2 Score 0 0 0 0 0  Altered sleeping 0 0 0 0 0  Tired, decreased energy 0 0 0 0 0  Change in appetite 0 0 0 0 0  Feeling bad or failure about yourself  0 0 0 0 0  Trouble concentrating 0 0 0 0 0  Moving slowly or fidgety/restless 0 0 0 0 0  Suicidal thoughts 0 0 0 0 0  PHQ-9 Score 0 0 0 0 0  Difficult doing work/chores   Not difficult at all      phq 9 is negative   Fall Risk:    05/09/2023    2:12 PM 02/28/2023    2:56 PM 12/06/2022   10:27 AM 11/27/2022    2:46 PM 08/21/2022    8:21 AM  Fall Risk   Falls in the past year? 0 0 0 0 0  Number falls in past yr:   0 0 0  Injury with Fall?   0 0 0  Risk for fall due to : No Fall Risks No Fall Risks No Fall Risks No Fall Risks No Fall Risks  Follow up Falls prevention discussed Falls prevention discussed Falls prevention discussed;Falls evaluation completed;Education provided Falls prevention discussed Falls prevention discussed      Assessment & Plan   1. Attention deficit hyperactivity disorder (ADHD), predominantly hyperactive type  - amphetamine-dextroamphetamine (ADDERALL XR) 25 MG 24 hr capsule; Take 1 capsule by mouth every morning.  Dispense: 30 capsule; Refill: 0 - amphetamine-dextroamphetamine (ADDERALL XR) 25 MG 24 hr capsule; Take 1 capsule by mouth every morning.  Dispense: 30 capsule; Refill: 0 - amphetamine-dextroamphetamine (ADDERALL XR) 25 MG 24 hr capsule; Take 1 capsule by mouth every morning.  Dispense: 30 capsule; Refill: 0 - amphetamine-dextroamphetamine (ADDERALL) 15 MG tablet; Take 1 tablet by mouth daily. At lunch-when working long hours  Dispense: 30 tablet; Refill: 0 - amphetamine-dextroamphetamine (ADDERALL) 15 MG tablet; Take 1 tablet by mouth daily.  Dispense: 30 tablet; Refill: 0 - amphetamine-dextroamphetamine (ADDERALL) 15 MG tablet; Take 1 tablet by mouth daily.  Dispense: 30 tablet; Refill: 0  2. B12 deficiency  Continue supplementation   3. Panic attack  - ALPRAZolam (XANAX) 0.5 MG tablet; Take 1 tablet (0.5 mg total) by mouth as needed for anxiety.  Dispense: 35 tablet; Refill: 0  4. Seasonal allergic rhinitis due to pollen   5. GAD (generalized anxiety disorder)

## 2023-05-09 ENCOUNTER — Ambulatory Visit: Payer: BC Managed Care – PPO | Admitting: Family Medicine

## 2023-05-09 ENCOUNTER — Encounter: Payer: Self-pay | Admitting: Family Medicine

## 2023-05-09 VITALS — BP 118/70 | HR 98 | Temp 97.9°F | Resp 16 | Ht 73.0 in | Wt 199.3 lb

## 2023-05-09 DIAGNOSIS — F901 Attention-deficit hyperactivity disorder, predominantly hyperactive type: Secondary | ICD-10-CM | POA: Diagnosis not present

## 2023-05-09 DIAGNOSIS — F410 Panic disorder [episodic paroxysmal anxiety] without agoraphobia: Secondary | ICD-10-CM

## 2023-05-09 DIAGNOSIS — J301 Allergic rhinitis due to pollen: Secondary | ICD-10-CM | POA: Diagnosis not present

## 2023-05-09 DIAGNOSIS — E538 Deficiency of other specified B group vitamins: Secondary | ICD-10-CM

## 2023-05-09 DIAGNOSIS — F411 Generalized anxiety disorder: Secondary | ICD-10-CM

## 2023-05-09 MED ORDER — AMPHETAMINE-DEXTROAMPHETAMINE 15 MG PO TABS
1.0000 | ORAL_TABLET | Freq: Every day | ORAL | 0 refills | Status: DC
Start: 2023-05-09 — End: 2023-08-09

## 2023-05-09 MED ORDER — AMPHETAMINE-DEXTROAMPHET ER 25 MG PO CP24: 25.0000 mg | ORAL_CAPSULE | ORAL | 0 refills | Status: DC

## 2023-05-09 MED ORDER — ALPRAZOLAM 0.5 MG PO TABS
0.5000 mg | ORAL_TABLET | ORAL | 0 refills | Status: DC | PRN
Start: 2023-05-09 — End: 2023-08-09

## 2023-05-09 MED ORDER — AMPHETAMINE-DEXTROAMPHETAMINE 15 MG PO TABS: 15.0000 mg | ORAL_TABLET | Freq: Every day | ORAL | 0 refills | Status: DC

## 2023-05-09 MED ORDER — AMPHETAMINE-DEXTROAMPHET ER 25 MG PO CP24
25.0000 mg | ORAL_CAPSULE | ORAL | 0 refills | Status: DC
Start: 2023-05-09 — End: 2023-08-09

## 2023-08-08 NOTE — Progress Notes (Unsigned)
Name: William Rivers   MRN: [id number]    DOB: 1991/05/11   Date:08/09/2023       Progress Note  Subjective  Chief Complaint  Follow Up  HPI  ADHD: he is taking medication as prescribed The medication helps him focus and also calms his mind. He denies side effects of medication.He is aware that Adderal is a controlled medication and he cannot share, sale or misuse. 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 He is under more stressed due his father in law being diagnosed with bladder cancer . We will add buspar to take prn    Chronic low back pain/Neck pain with radiculitis : pain is described as constant and dull, usually at the end of the day, feels more stiff than painful, he states stable. Used to have radiculitis but not recently , he stopped taking gabapentin,  but takes flexeril prn lately noticing symptoms on left arm and mostly on left lower leg. Burning and tingling, very seldom symptoms down right lower leg. Almost daily now. He states not using the same pillow , shoulder blades feels tight at the end of the day   AR: he is using Rialtris  prn and needs a refill of Zyrtec. Symptoms are seasonal , noticing sneezing and rhinorrhea, sometimes headache . He would like refills today   Vitamin B12: he has been compliant with SL B12   Urine odor and occasional dysuria: only one partner, no penile discharge. He noticed symptoms over the past couple of weeks. No penile rashes. No fever or chills but also has mild low back pain   Left lower  extremity edema: mild varicose veins, swelling of ankle is intermittent, discussed varicose veins   Patient Active Problem List   Diagnosis Date Noted   B12 deficiency 11/27/2022   PVC (premature ventricular contraction) 11/27/2022   ADD (attention deficit disorder) 05/29/2015   Chronic LBP 05/29/2015   Lactose intolerance 05/29/2015   Panic attack 05/29/2015   Perennial allergic rhinitis with seasonal variation 05/29/2015   Lumbosacral radiculitis 05/29/2015    Past Surgical History:  Procedure Laterality Date   TONSILLECTOMY     VASECTOMY Bilateral 02/21/2018    Family History  Problem Relation Age of Onset   Anxiety disorder Mother     Social History   Tobacco Use   Smoking status: Never   Smokeless tobacco: Current    Types: Chew  Substance Use Topics   Alcohol use: Yes    Alcohol/week: 18.0 standard drinks of alcohol    Types: 6 Cans of beer, 12 Standard drinks or equivalent per week     Current Outpatient Medications:    ALPRAZolam (XANAX) 0.5 MG tablet, Take 1 tablet (0.5 mg total) by mouth as needed for anxiety., Disp: 35 tablet, Rfl: 0   amphetamine-dextroamphetamine (ADDERALL XR) 25 MG 24 hr capsule, Take 1 capsule by mouth every morning., Disp: 30 capsule, Rfl: 0   amphetamine-dextroamphetamine (ADDERALL XR) 25 MG 24 hr capsule, Take 1 capsule by mouth every  morning., Disp: 30 capsule, Rfl: 0   amphetamine-dextroamphetamine (ADDERALL XR) 25 MG 24 hr capsule, Take 1 capsule by mouth every morning., Disp: 30 capsule, Rfl: 0   amphetamine-dextroamphetamine (ADDERALL) 15 MG tablet, Take 1 tablet by mouth daily. At lunch-when working long hours, Disp: 30 tablet, Rfl: 0   amphetamine-dextroamphetamine (ADDERALL) 15 MG tablet, Take 1 tablet by mouth daily., Disp: 30 tablet, Rfl: 0   amphetamine-dextroamphetamine (ADDERALL) 15 MG tablet, Take 1 tablet by mouth daily., Disp: 30 tablet, Rfl: 0   cetirizine (ZYRTEC) 10 MG tablet, Take 1 tablet (10 mg total) by mouth  daily., Disp: 90 tablet, Rfl: 0   cyclobenzaprine (FLEXERIL) 10 MG tablet, Take 0.5-1 tablets (5-10 mg total) by mouth at bedtime., Disp: 90 tablet, Rfl: 0   Olopatadine-Mometasone (RYALTRIS) 665-25 MCG/ACT SUSP, Place 1 spray into the nose 2 (two) times daily., Disp: 29 g, Rfl: 1  No Known Allergies  I personally reviewed active problem list, medication list, allergies, family history, social history, health maintenance with the patient/caregiver today.   ROS  Ten systems reviewed and is negative except as mentioned in HPI    Objective  Vitals:   08/09/23 1424  BP: 118/70  Pulse: 94  Resp: 16  Temp: 97.7 F (36.5 C)  TempSrc: Oral  SpO2: 99%  Weight: 202 lb 6.4 oz (91.8 kg)  Height: 6\' 1"  (1.854 m)    Body mass index is 26.7 kg/m.  Physical Exam  Constitutional: Patient appears well-developed and well-nourished.  No distress.  HEENT: head atraumatic, normocephalic, pupils equal and reactive to light, neck supple Cardiovascular: Normal rate, regular rhythm and normal heart sounds.  No murmur heard. No BLE edema. Superficial varicose veings  Pulmonary/Chest: Effort normal and breath sounds normal. No respiratory distress. Abdominal: Soft.  There is no tenderness. Muscular skeletal: normal rom of neck, some pain with extension that radiates down his arm , negative straight leg raise, normal grip, normal rom of both shoulders , decrease rom of spine  Psychiatric: Patient has a normal mood and affect. behavior is normal. Judgment and thought content normal.    PHQ2/9:    08/09/2023    2:27 PM 05/09/2023    2:20 PM 02/28/2023    3:01 PM 12/06/2022   10:27 AM 11/27/2022    2:46 PM  Depression screen PHQ 2/9  Decreased Interest 0 0 0 0 0  Down, Depressed, Hopeless 0 0 0 0 0  PHQ - 2 Score 0 0 0 0 0  Altered sleeping 0 0 0 0 0  Tired, decreased energy 0 0 0 0 0  Change in appetite 0 0 0 0 0  Feeling bad or failure about yourself  0 0 0 0 0  Trouble concentrating 0 0 0 0 0   Moving slowly or fidgety/restless 0 0 0 0 0  Suicidal thoughts 0 0 0 0 0  PHQ-9 Score 0 0 0 0 0  Difficult doing work/chores Not difficult at all   Not difficult at all     phq 9 is negative   Fall Risk:    08/09/2023    2:23 PM 05/09/2023    2:12 PM 02/28/2023    2:56 PM 12/06/2022   10:27 AM 11/27/2022    2:46 PM  Fall Risk   Falls in the past year? 0 0 0 0 0  Number falls in past yr: 0   0 0  Injury with Fall? 0   0 0  Risk for fall due to :  No Fall Risks No Fall Risks No Fall Risks No Fall Risks No Fall Risks  Follow up Falls prevention discussed;Falls evaluation completed;Education provided Falls prevention discussed Falls prevention discussed Falls prevention discussed;Falls evaluation completed;Education provided Falls prevention discussed      Functional Status Survey: Is the patient deaf or have difficulty hearing?: No Does the patient have difficulty seeing, even when wearing glasses/contacts?: No Does the patient have difficulty concentrating, remembering, or making decisions?: No Does the patient have difficulty walking or climbing stairs?: No Does the patient have difficulty dressing or bathing?: No Does the patient have difficulty doing errands alone such as visiting a doctor's office or shopping?: No    Assessment & Plan  1. Perennial allergic rhinitis with seasonal variation  - Olopatadine-Mometasone (RYALTRIS) 665-25 MCG/ACT SUSP; Place 1 spray into the nose 2 (two) times daily.  Dispense: 29 g; Refill: 1  2. Seasonal allergic rhinitis due to pollen  - cetirizine (ZYRTEC) 10 MG tablet; Take 1 tablet (10 mg total) by mouth daily.  Dispense: 90 tablet; Refill: 1  3. Attention deficit hyperactivity disorder (ADHD), predominantly hyperactive type  - amphetamine-dextroamphetamine (ADDERALL XR) 25 MG 24 hr capsule; Take 1 capsule by mouth every morning.  Dispense: 30 capsule; Refill: 0 - amphetamine-dextroamphetamine (ADDERALL XR) 25 MG 24 hr capsule; Take 1  capsule by mouth every morning.  Dispense: 30 capsule; Refill: 0 - amphetamine-dextroamphetamine (ADDERALL XR) 25 MG 24 hr capsule; Take 1 capsule by mouth every morning.  Dispense: 30 capsule; Refill: 0 - amphetamine-dextroamphetamine (ADDERALL) 15 MG tablet; Take 1 tablet by mouth daily.  Dispense: 30 tablet; Refill: 0 - amphetamine-dextroamphetamine (ADDERALL) 15 MG tablet; Take 1 tablet by mouth daily.  Dispense: 30 tablet; Refill: 0 - amphetamine-dextroamphetamine (ADDERALL) 15 MG tablet; Take 1 tablet by mouth daily. At lunch-when working long hours  Dispense: 30 tablet; Refill: 0  4. Panic attack  - ALPRAZolam (XANAX) 0.5 MG tablet; Take 1 tablet (0.5 mg total) by mouth as needed for anxiety.  Dispense: 35 tablet; Refill: 0 - busPIRone (BUSPAR) 5 MG tablet; Take 1-2 tablets (5-10 mg total) by mouth 3 (three) times daily as needed.  Dispense: 90 tablet; Refill: 0  5. Cervical radiculitis  - Ambulatory referral to Orthopedic Surgery - celecoxib (CELEBREX) 100 MG capsule; Take 1 capsule (100 mg total) by mouth 2 (two) times daily.  Dispense: 60 capsule; Refill: 0  6. Chronic bilateral low back pain without sciatica  - celecoxib (CELEBREX) 100 MG capsule; Take 1 capsule (100 mg total) by mouth 2 (two) times daily.  Dispense: 60 capsule; Refill: 0

## 2023-08-09 ENCOUNTER — Encounter: Payer: Self-pay | Admitting: Family Medicine

## 2023-08-09 ENCOUNTER — Ambulatory Visit: Payer: BC Managed Care – PPO | Admitting: Family Medicine

## 2023-08-09 VITALS — BP 118/70 | HR 94 | Temp 97.7°F | Resp 16 | Ht 73.0 in | Wt 202.4 lb

## 2023-08-09 DIAGNOSIS — J301 Allergic rhinitis due to pollen: Secondary | ICD-10-CM

## 2023-08-09 DIAGNOSIS — J302 Other seasonal allergic rhinitis: Secondary | ICD-10-CM

## 2023-08-09 DIAGNOSIS — G8929 Other chronic pain: Secondary | ICD-10-CM

## 2023-08-09 DIAGNOSIS — M545 Low back pain, unspecified: Secondary | ICD-10-CM

## 2023-08-09 DIAGNOSIS — M5412 Radiculopathy, cervical region: Secondary | ICD-10-CM | POA: Diagnosis not present

## 2023-08-09 DIAGNOSIS — F901 Attention-deficit hyperactivity disorder, predominantly hyperactive type: Secondary | ICD-10-CM

## 2023-08-09 DIAGNOSIS — F410 Panic disorder [episodic paroxysmal anxiety] without agoraphobia: Secondary | ICD-10-CM

## 2023-08-09 DIAGNOSIS — J3089 Other allergic rhinitis: Secondary | ICD-10-CM

## 2023-08-09 MED ORDER — ALPRAZOLAM 0.5 MG PO TABS: 0.5000 mg | ORAL_TABLET | ORAL | 0 refills | Status: DC | PRN

## 2023-08-09 MED ORDER — AMPHETAMINE-DEXTROAMPHETAMINE 15 MG PO TABS: 1.0000 | ORAL_TABLET | Freq: Every day | ORAL | 0 refills | Status: DC

## 2023-08-09 MED ORDER — CELECOXIB 100 MG PO CAPS: 100.0000 mg | ORAL_CAPSULE | Freq: Two times a day (BID) | ORAL | 0 refills | Status: DC

## 2023-08-09 MED ORDER — AMPHETAMINE-DEXTROAMPHETAMINE 15 MG PO TABS
15.0000 mg | ORAL_TABLET | Freq: Every day | ORAL | 0 refills | Status: DC
Start: 2023-08-09 — End: 2023-11-19

## 2023-08-09 MED ORDER — RYALTRIS 665-25 MCG/ACT NA SUSP
1.0000 | Freq: Two times a day (BID) | NASAL | 1 refills | Status: DC
Start: 2023-08-09 — End: 2023-11-19

## 2023-08-09 MED ORDER — AMPHETAMINE-DEXTROAMPHET ER 25 MG PO CP24
25.0000 mg | ORAL_CAPSULE | ORAL | 0 refills | Status: DC
Start: 2023-08-09 — End: 2023-11-19

## 2023-08-09 MED ORDER — BUSPIRONE HCL 5 MG PO TABS
5.0000 mg | ORAL_TABLET | Freq: Three times a day (TID) | ORAL | 0 refills | Status: DC | PRN
Start: 2023-08-09 — End: 2023-11-19

## 2023-08-09 MED ORDER — AMPHETAMINE-DEXTROAMPHET ER 25 MG PO CP24
25.0000 mg | ORAL_CAPSULE | ORAL | 0 refills | Status: DC
Start: 2023-08-09 — End: 2023-10-29

## 2023-08-09 MED ORDER — CETIRIZINE HCL 10 MG PO TABS
10.0000 mg | ORAL_TABLET | Freq: Every day | ORAL | 1 refills | Status: DC
Start: 2023-08-09 — End: 2024-05-13

## 2023-08-09 MED ORDER — AMPHETAMINE-DEXTROAMPHET ER 25 MG PO CP24: 25.0000 mg | ORAL_CAPSULE | ORAL | 0 refills | Status: DC

## 2023-08-13 DIAGNOSIS — M5416 Radiculopathy, lumbar region: Secondary | ICD-10-CM | POA: Insufficient documentation

## 2023-08-13 DIAGNOSIS — M5412 Radiculopathy, cervical region: Secondary | ICD-10-CM | POA: Diagnosis not present

## 2023-10-29 ENCOUNTER — Telehealth: Payer: Self-pay | Admitting: Family Medicine

## 2023-10-29 ENCOUNTER — Other Ambulatory Visit: Payer: Self-pay

## 2023-10-29 ENCOUNTER — Other Ambulatory Visit: Payer: Self-pay | Admitting: Family Medicine

## 2023-10-29 DIAGNOSIS — F901 Attention-deficit hyperactivity disorder, predominantly hyperactive type: Secondary | ICD-10-CM

## 2023-10-29 MED ORDER — AMPHETAMINE-DEXTROAMPHET ER 25 MG PO CP24
25.0000 mg | ORAL_CAPSULE | ORAL | 0 refills | Status: DC
  Filled 2023-10-29: qty 30, 30d supply, fill #0

## 2023-10-29 NOTE — Telephone Encounter (Signed)
No answer from pt left detailed vm about rx

## 2023-10-29 NOTE — Telephone Encounter (Signed)
Patient came in the office saying he needs a New rx for Adderral for 10mg  instead of 25mg  due to the his pharm does not have the 25mg  and don't know when they may get this in.Pharm [facility] ask him to just get a rx for 5 days only of the 10mg  and he woiuld have to take the 10mg  two times a day.

## 2023-11-08 ENCOUNTER — Other Ambulatory Visit: Payer: Self-pay

## 2023-11-12 NOTE — Progress Notes (Signed)
Name: William Rivers   MRN: [id number]    DOB: [birth year]   Date:11/19/2023       Progress Note  Subjective  Chief Complaint  Chief Complaint  Patient presents with   Medical Management of Chronic Issues   Medication Refill    HPI  ADHD: he is taking medication as prescribed The medication helps him focus and also calms his mind. He denies side effects of medication.He is aware that Adderal is a controlled medication and he cannot share, sale or misuse. He would like to try a 90 day supply since he has problems getting it filled every time    Palpitation: he developed symptoms in 2021  he decreased caffeine intake and symptoms resolved, symptoms increased again earlier in 2022 we referred him to cardiology, he wore a holter monitor that showed PVC's he was given metoprolol but stopped taking it after a few days due to side effects. 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 I gave him Buspar but he did not like it    Chronic low back pain/Neck pain with radiculitis : pain is described as constant and dull, usually at the end of the day, feels more stiff than painful, he states stable. Used to have radiculitis but not recently , he stopped taking gabapentin,  but takes flexeril prn  and needs a refill    AR: he is using Rialtris  prn and needs a refill of Zyrtec. Symptoms are stable but needs refills.    Patient Active Problem List   Diagnosis Date Noted   B12 deficiency 11/27/2022   PVC (premature ventricular contraction) 11/27/2022   ADD (attention deficit disorder) 05/29/2015   Chronic low back pain 05/29/2015   Lactose intolerance 05/29/2015   Panic attack 05/29/2015   Perennial allergic rhinitis with seasonal variation 05/29/2015   Lumbosacral radiculitis 05/29/2015    Past Surgical History:  Procedure Laterality Date   TONSILLECTOMY      VASECTOMY Bilateral 02/21/2018    Family History  Problem Relation Age of Onset   Anxiety disorder Mother     Social History   Tobacco Use   Smoking status: Never   Smokeless tobacco: Current    Types: Chew  Substance Use Topics   Alcohol use: Yes    Alcohol/week: 18.0 standard drinks of alcohol    Types: 6 Cans of beer, 12 Standard drinks or equivalent per week     Current Outpatient Medications:    celecoxib (CELEBREX) 100 MG capsule, Take 1 capsule (100 mg total) by mouth 2 (two) times daily., Disp: 60 capsule, Rfl: 0   cetirizine (ZYRTEC) 10 MG tablet, Take 1 tablet (10 mg total) by mouth daily., Disp: 90 tablet, Rfl: 1   ALPRAZolam (XANAX) 0.5 MG tablet, Take 1 tablet (0.5 mg total) by mouth as needed for anxiety., Disp: 35 tablet, Rfl: 0   amphetamine-dextroamphetamine (ADDERALL XR) 25 MG 24 hr capsule, Take 1 capsule by mouth every morning., Disp: 90 capsule, Rfl: 0   amphetamine-dextroamphetamine (ADDERALL) 15 MG tablet, Take 1 tablet by mouth daily., Disp: 90 tablet, Rfl: 0   cyclobenzaprine (FLEXERIL) 10 MG tablet, Take 0.5-1 tablets (5-10 mg total) by mouth at bedtime., Disp: 90 tablet, Rfl: 0   Olopatadine-Mometasone (RYALTRIS) 665-25 MCG/ACT SUSP, Place 1 spray into the nose 2 (two) times daily., Disp: 29 g, Rfl: 2  No Known Allergies  I personally reviewed active problem list, medication list, allergies, family history with the patient/caregiver today.   ROS  Ten systems reviewed and is negative except as mentioned in HPI    Objective  Vitals:   11/19/23 0931  BP: 118/76  Pulse: 96  Resp: 16  Temp: 98 F (36.7 C)  SpO2: 99%  Weight: 201 lb 9.6 oz (91.4 kg)  Height: 6\' 1"  (1.854 m)    Body mass index is 26.6 kg/m.  Physical Exam  Constitutional: Patient appears well-developed and well-nourished. No distress.  HEENT: head atraumatic, normocephalic, pupils equal and reactive to light,  neck supple, throat within normal limits Cardiovascular:  Normal rate, regular rhythm and normal heart sounds.  No murmur heard. No BLE edema. Pulmonary/Chest: Effort normal and breath sounds normal. No respiratory distress. Abdominal: Soft.  There is no tenderness. Psychiatric: Patient has a normal mood and affect. behavior is normal. Judgment and thought content normal.   Diabetic Foot Exam:     PHQ2/9:    08/09/2023    2:27 PM 05/09/2023    2:20 PM 02/28/2023    3:01 PM 12/06/2022   10:27 AM 11/27/2022    2:46 PM  Depression screen PHQ 2/9  Decreased Interest 0 0 0 0 0  Down, Depressed, Hopeless 0 0 0 0 0  PHQ - 2 Score 0 0 0 0 0  Altered sleeping 0 0 0 0 0  Tired, decreased energy 0 0 0 0 0  Change in appetite 0 0 0 0 0  Feeling bad or failure about yourself  0 0 0 0 0  Trouble concentrating 0 0 0 0 0  Moving slowly or fidgety/restless 0 0 0 0 0  Suicidal thoughts 0 0 0 0 0  PHQ-9 Score 0 0 0 0 0  Difficult doing work/chores Not difficult at all   Not difficult at all     phq 9 is negative   Fall Risk:    11/19/2023    9:31 AM 08/09/2023    2:23 PM 05/09/2023    2:12 PM 02/28/2023    2:56 PM 12/06/2022   10:27 AM  Fall Risk   Falls in the past year? 0 0 0 0 0  Number falls in past yr: 0 0   0  Injury with Fall? 0 0   0  Risk for fall due to :  No Fall Risks No Fall Risks No Fall Risks No Fall Risks  Follow up Falls evaluation completed Falls prevention discussed;Falls evaluation completed;Education provided Falls prevention discussed Falls prevention discussed Falls prevention discussed;Falls evaluation completed;Education provided     Assessment & Plan  1. Attention deficit hyperactivity disorder (ADHD), predominantly hyperactive type (Primary)  - amphetamine-dextroamphetamine (ADDERALL) 15 MG tablet; Take 1 tablet by mouth daily.  Dispense: 90 tablet; Refill: 0 - amphetamine-dextroamphetamine (ADDERALL XR) 25 MG 24 hr capsule; Take 1 capsule by mouth every morning.  Dispense: 90 capsule; Refill: 0  2. Perennial allergic  rhinitis with seasonal variation  - Olopatadine-Mometasone (RYALTRIS) 665-25 MCG/ACT SUSP; Place 1 spray into the nose 2 (two) times daily.  Dispense: 29 g; Refill: 2  3. Panic attack  - ALPRAZolam (XANAX) 0.5 MG tablet; Take 1 tablet (0.5 mg total) by mouth as needed for anxiety.  Dispense: 35 tablet; Refill: 0  4. Chronic bilateral low back pain without sciatica  - cyclobenzaprine (FLEXERIL) 10 MG tablet; Take 0.5-1 tablets (5-10 mg total) by mouth at bedtime.  Dispense: 90 tablet; Refill:  0        

## 2023-11-19 ENCOUNTER — Ambulatory Visit: Payer: 59 | Admitting: Family Medicine

## 2023-11-19 ENCOUNTER — Other Ambulatory Visit: Payer: Self-pay | Admitting: Family Medicine

## 2023-11-19 ENCOUNTER — Encounter: Payer: Self-pay | Admitting: Family Medicine

## 2023-11-19 VITALS — BP 118/76 | HR 96 | Temp 98.0°F | Resp 16 | Ht 73.0 in | Wt 201.6 lb

## 2023-11-19 DIAGNOSIS — J302 Other seasonal allergic rhinitis: Secondary | ICD-10-CM

## 2023-11-19 DIAGNOSIS — F410 Panic disorder [episodic paroxysmal anxiety] without agoraphobia: Secondary | ICD-10-CM

## 2023-11-19 DIAGNOSIS — G8929 Other chronic pain: Secondary | ICD-10-CM

## 2023-11-19 DIAGNOSIS — J3089 Other allergic rhinitis: Secondary | ICD-10-CM

## 2023-11-19 DIAGNOSIS — F901 Attention-deficit hyperactivity disorder, predominantly hyperactive type: Secondary | ICD-10-CM | POA: Diagnosis not present

## 2023-11-19 DIAGNOSIS — M545 Low back pain, unspecified: Secondary | ICD-10-CM

## 2023-11-19 MED ORDER — RYALTRIS 665-25 MCG/ACT NA SUSP: 1.0000 | Freq: Two times a day (BID) | NASAL | 2 refills | Status: DC

## 2023-11-19 MED ORDER — CYCLOBENZAPRINE HCL 10 MG PO TABS: 5.0000 mg | ORAL_TABLET | Freq: Every day | ORAL | 0 refills | Status: AC

## 2023-11-19 MED ORDER — AMPHETAMINE-DEXTROAMPHET ER 25 MG PO CP24: 25.0000 mg | ORAL_CAPSULE | ORAL | 0 refills | Status: DC

## 2023-11-19 MED ORDER — ALPRAZOLAM 0.5 MG PO TABS: 0.5000 mg | ORAL_TABLET | ORAL | 0 refills | Status: DC | PRN

## 2023-11-19 MED ORDER — AMPHETAMINE-DEXTROAMPHETAMINE 15 MG PO TABS: 15.0000 mg | ORAL_TABLET | Freq: Every day | ORAL | 0 refills | Status: DC

## 2023-12-08 ENCOUNTER — Other Ambulatory Visit: Payer: Self-pay | Admitting: Family Medicine

## 2023-12-08 DIAGNOSIS — F410 Panic disorder [episodic paroxysmal anxiety] without agoraphobia: Secondary | ICD-10-CM

## 2023-12-11 ENCOUNTER — Other Ambulatory Visit: Payer: Self-pay | Admitting: Family Medicine

## 2023-12-11 DIAGNOSIS — F410 Panic disorder [episodic paroxysmal anxiety] without agoraphobia: Secondary | ICD-10-CM

## 2023-12-12 NOTE — Telephone Encounter (Signed)
No answer from pt left detailed vm. °

## 2024-02-06 ENCOUNTER — Other Ambulatory Visit: Payer: Self-pay | Admitting: Family Medicine

## 2024-02-06 DIAGNOSIS — F410 Panic disorder [episodic paroxysmal anxiety] without agoraphobia: Secondary | ICD-10-CM

## 2024-02-06 NOTE — Telephone Encounter (Signed)
 Copied from CRM [id number]. Topic: Clinical - Medication Refill >> Feb 06, 2024  2:02 PM Geroge Baseman wrote: Most Recent Primary Care Visit:  Provider: Alba Cory  Department: [facility]  Visit Type: OFFICE VISIT  Date: 11/19/2023  Medication: ALPRAZolam Prudy Feeler) 0.5 MG tablet  Has the patient contacted their pharmacy? Yes Call office   Is this the correct pharmacy for this prescription? Yes If no, delete pharmacy and type the correct one.  This is the patient's preferred pharmacy:  [facility] [address] [place] - [address] [place] [address] Phone: [phone] Fax: [phone]    Has the prescription been filled recently? No  Is the patient out of the medication? Yes  Has the patient been seen for an appointment in the last year OR does the patient have an upcoming appointment? Yes  Can we respond through MyChart? No  Agent: Please be advised that Rx refills may take up to 3 business days. We ask that you follow-up with your pharmacy.

## 2024-02-06 NOTE — Telephone Encounter (Unsigned)
 Copied from CRM [id number]. Topic: Clinical - Medication Refill >> Feb 06, 2024  2:02 PM Geroge Baseman wrote: Most Recent Primary Care Visit:  Provider: Alba Cory  Department: [facility]  Visit Type: OFFICE VISIT  Date: 11/19/2023  Medication: ALPRAZolam Prudy Feeler) 0.5 MG tablet  Has the patient contacted their pharmacy? Yes Call office   Is this the correct pharmacy for this prescription? Yes If no, delete pharmacy and type the correct one.  This is the patient's preferred pharmacy:  [facility] [address] [place] - [address] [place] [address] Phone: [phone] Fax: [phone]    Has the prescription been filled recently? No  Is the patient out of the medication? Yes  Has the patient been seen for an appointment in the last year OR does the patient have an upcoming appointment? Yes  Can we respond through MyChart? No  Agent: Please be advised that Rx refills may take up to 3 business days. We ask that you follow-up with your pharmacy.

## 2024-02-07 NOTE — Telephone Encounter (Signed)
   Notes to clinic: Duplicate request- see previous, non delegated Rx  Requested Prescriptions  Pending Prescriptions Disp Refills   ALPRAZolam (XANAX) 0.5 MG tablet 35 tablet 0    Sig: Take 1 tablet (0.5 mg total) by mouth as needed for anxiety.     Not Delegated - Psychiatry: Anxiolytics/Hypnotics 2 Failed - 02/07/2024  8:44 AM      Failed - This refill cannot be delegated      Failed - Urine Drug Screen completed in last 360 days      Passed - Patient is not pregnant      Passed - Valid encounter within last 6 months    Recent Outpatient Visits           2 months ago Attention deficit hyperactivity disorder (ADHD), predominantly hyperactive type   [facility] Alba Cory, MD   6 months ago Cervical radiculitis   [facility] Alba Cory, MD   9 months ago Attention deficit hyperactivity disorder (ADHD), predominantly hyperactive type   [facility] Alba Cory, MD   11 months ago Attention deficit hyperactivity disorder (ADHD), predominantly hyperactive type   [facility] Alba Cory, MD   1 year ago Upper respiratory tract infection, unspecified type   [facility]ecum, Oswaldo Conroy, PA-C       Future Appointments             In 1 week Alba Cory, MD [facility], [facility]               Requested Prescriptions  Pending Prescriptions Disp Refills   ALPRAZolam (XANAX) 0.5 MG tablet 35 tablet 0    Sig: Take 1 tablet (0.5 mg total) by mouth as needed for anxiety.     Not Delegated - Psychiatry: Anxiolytics/Hypnotics 2 Failed - 02/07/2024  8:44 AM      Failed - This refill cannot be delegated      Failed - Urine Drug Screen completed in last 360 days      Passed - Patient is not pregnant      Passed - Valid encounter within last 6 months    Recent Outpatient Visits           2 months ago  Attention deficit hyperactivity disorder (ADHD), predominantly hyperactive type   [facility] Alba [facility], MD   6 months ago Cervical radiculitis   [facility] Alba Cory, MD   9 months ago Attention deficit hyperactivity disorder (ADHD), predominantly hyperactive type   [facility] Alba Cory, MD   11 months ago Attention deficit hyperactivity disorder (ADHD), predominantly hyperactive type   [facility] Alba Cory, MD   1 year ago Upper respiratory tract infection, unspecified type   [facility] Mecum, Oswaldo Conroy, [place]       Future Appointments             In 1 week Alba Cory, MD [facility], [facility]

## 2024-02-07 NOTE — Telephone Encounter (Signed)
 Requested medication (s) are due for refill today - yes  Requested medication (s) are on the active medication list -yes  Future visit scheduled -yes  Last refill: 11/19/23 #35  Notes to clinic: non delegated Rx  Requested Prescriptions  Pending Prescriptions Disp Refills   ALPRAZolam (XANAX) 0.5 MG tablet 35 tablet 0    Sig: Take 1 tablet (0.5 mg total) by mouth as needed for anxiety.     Not Delegated - Psychiatry: Anxiolytics/Hypnotics 2 Failed - 02/07/2024  8:43 AM      Failed - This refill cannot be delegated      Failed - Urine Drug Screen completed in last 360 days      Passed - Patient is not pregnant      Passed - Valid encounter within last 6 months    Recent Outpatient Visits           2 months ago Attention deficit hyperactivity disorder (ADHD), predominantly hyperactive type   [facility] Alba Cory, MD   6 months ago Cervical radiculitis   [facility] Alba Cory, MD   9 months ago Attention deficit hyperactivity disorder (ADHD), predominantly hyperactive type   [facility] Alba Cory, MD   11 months ago Attention deficit hyperactivity disorder (ADHD), predominantly hyperactive type   [facility] Alba Cory, MD   1 year ago Upper respiratory tract infection, unspecified type   [facility] Mecum, Oswaldo Conroy, PA-C       Future Appointments             In 1 week Alba Cory, MD [facility], [facility]               Requested Prescriptions  Pending Prescriptions Disp Refills   ALPRAZolam (XANAX) 0.5 MG tablet 35 tablet 0    Sig: Take 1 tablet (0.5 mg total) by mouth as needed for anxiety.     Not Delegated - Psychiatry: Anxiolytics/Hypnotics 2 Failed - 02/07/2024  8:43 AM      Failed - This refill cannot be delegated      Failed - Urine Drug Screen completed in last 360 days       Passed - Patient is not pregnant      Passed - Valid encounter within last 6 months    Recent Outpatient Visits           2 months ago Attention deficit hyperactivity disorder (ADHD), predominantly hyperactive type   [facility] Alba Cory, MD   6 months ago Cervical radiculitis   [facility] Alba Cory, MD   9 months ago Attention deficit hyperactivity disorder (ADHD), predominantly hyperactive type   [facility] Alba Cory, MD   11 months ago Attention deficit hyperactivity disorder (ADHD), predominantly hyperactive type   [facility] Alba Cory, MD   1 year ago Upper respiratory tract infection, unspecified type   [facility] Mecum, Oswaldo Conroy, [place]       Future Appointments             In 1 week Alba Cory, MD [facility], [facility]

## 2024-02-15 ENCOUNTER — Encounter: Payer: Self-pay | Admitting: Family Medicine

## 2024-02-15 ENCOUNTER — Ambulatory Visit: Payer: 59 | Admitting: Family Medicine

## 2024-02-15 VITALS — BP 130/82 | HR 92 | Resp 16 | Ht 73.0 in | Wt 202.4 lb

## 2024-02-15 DIAGNOSIS — J302 Other seasonal allergic rhinitis: Secondary | ICD-10-CM

## 2024-02-15 DIAGNOSIS — J3089 Other allergic rhinitis: Secondary | ICD-10-CM | POA: Diagnosis not present

## 2024-02-15 DIAGNOSIS — F411 Generalized anxiety disorder: Secondary | ICD-10-CM

## 2024-02-15 DIAGNOSIS — F901 Attention-deficit hyperactivity disorder, predominantly hyperactive type: Secondary | ICD-10-CM | POA: Diagnosis not present

## 2024-02-15 DIAGNOSIS — Z79899 Other long term (current) drug therapy: Secondary | ICD-10-CM

## 2024-02-15 DIAGNOSIS — Z13220 Encounter for screening for lipoid disorders: Secondary | ICD-10-CM

## 2024-02-15 DIAGNOSIS — E538 Deficiency of other specified B group vitamins: Secondary | ICD-10-CM

## 2024-02-15 DIAGNOSIS — M545 Low back pain, unspecified: Secondary | ICD-10-CM

## 2024-02-15 DIAGNOSIS — G8929 Other chronic pain: Secondary | ICD-10-CM

## 2024-02-15 DIAGNOSIS — F410 Panic disorder [episodic paroxysmal anxiety] without agoraphobia: Secondary | ICD-10-CM | POA: Diagnosis not present

## 2024-02-15 MED ORDER — AMPHETAMINE-DEXTROAMPHET ER 25 MG PO CP24: 25.0000 mg | ORAL_CAPSULE | ORAL | 0 refills | Status: DC

## 2024-02-15 MED ORDER — ESCITALOPRAM OXALATE 10 MG PO TABS: 10.0000 mg | ORAL_TABLET | Freq: Every day | ORAL | 0 refills | Status: DC

## 2024-02-15 MED ORDER — ALPRAZOLAM 0.5 MG PO TABS
0.5000 mg | ORAL_TABLET | ORAL | 0 refills | Status: DC | PRN
Start: 2024-02-24 — End: 2024-02-20

## 2024-02-15 MED ORDER — AMPHETAMINE-DEXTROAMPHETAMINE 15 MG PO TABS: 15.0000 mg | ORAL_TABLET | Freq: Every day | ORAL | 0 refills | Status: DC

## 2024-02-15 NOTE — Progress Notes (Signed)
 Name: William Rivers   MRN: [id number]    DOB: [birth year]   Date:02/15/2024       Progress Note  Subjective  Chief Complaint  Chief Complaint  Patient presents with   Medical Management of Chronic Issues   HPI   ADHD: he is taking medication as prescribed The medication helps him focus and also calms his mind. He denies side effects of medication.He is aware that Adderal is a controlled medication and he cannot share, sale or misuse. He has been getting 90 days supplies now. Using [facility]   Palpitation: he developed symptoms in 2021  he decreased caffeine intake and symptoms resolved, symptoms increased again earlier in 2022 we referred him to cardiology, he wore a holter monitor that showed PVC's he was given metoprolol but stopped taking it after a few days due to side effects. 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 He states he ran out of his alprazolam and has to breath on paper bags or uses a rubber band on his wrist to control panic attacks. He states he takes half a pill prn and it is random. He is willing to try adding SSRI. His mother takes Lexapro so we will try that first    Chronic low back pain/Neck pain with radiculitis : pain is described as constant and dull, usually at the end of the day, feels more stiff than painful, he states stable. Used to have radiculitis but not recently , he stopped taking gabapentin, takes flexeril prn    AR: he is using Rialtris  prn and needs a refill of Zyrtec. Continue medication  Vitamin B12 deficiency: he has been taking B12 SL. He states his current insurance is not good . He will wait to have labs when they change insurance  Patient Active Problem List   Diagnosis Date Noted   B12 deficiency 11/27/2022   PVC (premature ventricular contraction) 11/27/2022    ADD (attention deficit disorder) 05/29/2015   Chronic low back pain 05/29/2015   Lactose intolerance 05/29/2015   Panic attack 05/29/2015   Perennial allergic rhinitis with seasonal variation 05/29/2015   Lumbosacral radiculitis 05/29/2015    Past Surgical History:  Procedure Laterality Date   TONSILLECTOMY     VASECTOMY Bilateral 02/21/2018    Family History  Problem Relation Age of Onset   Anxiety disorder Mother     Social History   Tobacco Use   Smoking status: Never   Smokeless tobacco: Current    Types: Chew  Substance Use Topics   Alcohol use: Yes    Alcohol/week: 18.0 standard drinks of alcohol    Types: 6 Cans of beer, 12 Standard drinks or equivalent per week     Current Outpatient Medications:    ALPRAZolam (XANAX) 0.5 MG tablet, Take 1 tablet (0.5 mg total) by mouth as needed for anxiety., Disp: 35 tablet, Rfl: 0   amphetamine-dextroamphetamine (ADDERALL XR) 25 MG 24 hr capsule, Take 1 capsule by mouth every morning., Disp: 90 capsule, Rfl: 0   amphetamine-dextroamphetamine (ADDERALL) 15 MG tablet, Take 1 tablet by mouth daily., Disp: 90 tablet, Rfl: 0   celecoxib (CELEBREX) 100 MG capsule, Take 1 capsule (100 mg total) by mouth 2 (two) times daily., Disp: 60 capsule, Rfl: 0   cetirizine (ZYRTEC) 10 MG  tablet, Take 1 tablet (10 mg total) by mouth daily., Disp: 90 tablet, Rfl: 1   cyclobenzaprine (FLEXERIL) 10 MG tablet, Take 0.5-1 tablets (5-10 mg total) by mouth at bedtime., Disp: 90 tablet, Rfl: 0   Olopatadine HCl 0.6 % SOLN, Place 1 spray into both nostrils daily at 12 noon., Disp: 30 g, Rfl: 0  No Known Allergies  I personally reviewed active problem list, medication list, allergies with the patient/caregiver today.   ROS  Ten systems reviewed and is negative except as mentioned in HPI    Objective Physical Exam   Vitals:   02/15/24 0854  BP: 130/82  Pulse: 92  Resp: 16  SpO2: 94%  Weight: 202 lb 6.4 oz (91.8 kg)  Height: 6\' 1"  (1.854 m)     Body mass index is 26.7 kg/m.    Diabetic Foot Exam:     PHQ2/9:    02/15/2024    8:54 AM 08/09/2023    2:27 PM 05/09/2023    2:20 PM 02/28/2023    3:01 PM 12/06/2022   10:27 AM  Depression screen PHQ 2/9  Decreased Interest 0 0 0 0 0  Down, Depressed, Hopeless 0 0 0 0 0  PHQ - 2 Score 0 0 0 0 0  Altered sleeping 0 0 0 0 0  Tired, decreased energy 0 0 0 0 0  Change in appetite 0 0 0 0 0  Feeling bad or failure about yourself  0 0 0 0 0  Trouble concentrating 0 0 0 0 0  Moving slowly or fidgety/restless 0 0 0 0 0  Suicidal thoughts 0 0 0 0 0  PHQ-9 Score 0 0 0 0 0  Difficult doing work/chores Not difficult at all Not difficult at all   Not difficult at all    phq 9 is negative  Fall Risk:    11/19/2023    9:31 AM 08/09/2023    2:23 PM 05/09/2023    2:12 PM 02/28/2023    2:56 PM 12/06/2022   10:27 AM  Fall Risk   Falls in the past year? 0 0 0 0 0  Number falls in past yr: 0 0   0  Injury with Fall? 0 0   0  Risk for fall due to :  No Fall Risks No Fall Risks No Fall Risks No Fall Risks  Follow up Falls evaluation completed Falls prevention discussed;Falls evaluation completed;Education provided Falls prevention discussed Falls prevention discussed Falls prevention discussed;Falls evaluation completed;Education provided     Assessment & Plan  1. Attention deficit hyperactivity disorder (ADHD), predominantly hyperactive type (Primary)  - amphetamine-dextroamphetamine (ADDERALL XR) 25 MG 24 hr capsule; Take 1 capsule by mouth every morning.  Dispense: 90 capsule; Refill: 0 - amphetamine-dextroamphetamine (ADDERALL) 15 MG tablet; Take 1 tablet by mouth daily.  Dispense: 90 tablet; Refill: 0  2. Perennial allergic rhinitis with seasonal variation  Continue medication  3. Panic attack  - ALPRAZolam (XANAX) 0.5 MG tablet; Take 1 tablet (0.5 mg total) by mouth as needed for anxiety.  Dispense: 40 tablet; Refill: 0  4. Chronic bilateral low back pain without  sciatica  Continue prn medication  5. B12 deficiency  - B12 and Folate Panel - CBC with Differential/Platelet  6. GAD (generalized anxiety disorder)  - escitalopram (LEXAPRO) 10 MG tablet; Take 1 tablet (10 mg total) by mouth daily.  Dispense: 90 tablet; Refill: 0  7. Lipid screening  - Lipid panel  8. Long-term use of high-risk medication  -  COMPLETE METABOLIC PANEL WITHOUT GFR

## 2024-02-20 ENCOUNTER — Telehealth: Payer: Self-pay | Admitting: Family Medicine

## 2024-02-20 ENCOUNTER — Other Ambulatory Visit: Payer: Self-pay | Admitting: Family Medicine

## 2024-02-20 DIAGNOSIS — F410 Panic disorder [episodic paroxysmal anxiety] without agoraphobia: Secondary | ICD-10-CM

## 2024-02-20 MED ORDER — ALPRAZOLAM 0.5 MG PO TABS: 0.5000 mg | ORAL_TABLET | Freq: Every day | ORAL | 0 refills | Status: DC | PRN

## 2024-02-20 NOTE — Telephone Encounter (Signed)
 Pt has been out of alprazlam for over a week. It has been sent to the pharmacy however he received [facility] from [facility] saying more information needed to be provided. Can you please contact pharmacy to see what is going on.

## 2024-02-20 NOTE — Telephone Encounter (Signed)
 Spoke to pharmacy and told you need to re send the prescription with actual frequency not just PRN. Per there protocol

## 2024-05-13 ENCOUNTER — Encounter: Payer: Self-pay | Admitting: Family Medicine

## 2024-05-13 ENCOUNTER — Ambulatory Visit: Admitting: Family Medicine

## 2024-05-13 VITALS — BP 132/80 | HR 91 | Resp 16 | Ht 73.0 in | Wt 198.9 lb

## 2024-05-13 DIAGNOSIS — F411 Generalized anxiety disorder: Secondary | ICD-10-CM

## 2024-05-13 DIAGNOSIS — J302 Other seasonal allergic rhinitis: Secondary | ICD-10-CM

## 2024-05-13 DIAGNOSIS — M5412 Radiculopathy, cervical region: Secondary | ICD-10-CM

## 2024-05-13 DIAGNOSIS — F410 Panic disorder [episodic paroxysmal anxiety] without agoraphobia: Secondary | ICD-10-CM

## 2024-05-13 DIAGNOSIS — J3089 Other allergic rhinitis: Secondary | ICD-10-CM

## 2024-05-13 DIAGNOSIS — M5441 Lumbago with sciatica, right side: Secondary | ICD-10-CM

## 2024-05-13 DIAGNOSIS — G8929 Other chronic pain: Secondary | ICD-10-CM

## 2024-05-13 DIAGNOSIS — F901 Attention-deficit hyperactivity disorder, predominantly hyperactive type: Secondary | ICD-10-CM

## 2024-05-13 DIAGNOSIS — E538 Deficiency of other specified B group vitamins: Secondary | ICD-10-CM

## 2024-05-13 MED ORDER — ALPRAZOLAM 0.5 MG PO TABS: 0.5000 mg | ORAL_TABLET | Freq: Every day | ORAL | 0 refills | Status: DC | PRN

## 2024-05-13 MED ORDER — LEVOCETIRIZINE DIHYDROCHLORIDE 5 MG PO TABS: 5.0000 mg | ORAL_TABLET | Freq: Every evening | ORAL | 1 refills | Status: AC

## 2024-05-13 MED ORDER — AMPHETAMINE-DEXTROAMPHETAMINE 15 MG PO TABS: 15.0000 mg | ORAL_TABLET | Freq: Every day | ORAL | 0 refills | Status: DC

## 2024-05-13 MED ORDER — AMPHETAMINE-DEXTROAMPHET ER 25 MG PO CP24
25.0000 mg | ORAL_CAPSULE | ORAL | 0 refills | Status: DC
Start: 2024-05-13 — End: 2024-05-22

## 2024-05-13 NOTE — Progress Notes (Signed)
 Name: William Rivers   MRN: [id number]    DOB: [birth year]   Date:05/13/2024       Progress Note  Subjective  Chief Complaint  Chief Complaint  Patient presents with   Medical Management of Chronic Issues   HPI   History of Present Illness William Rivers is a 33 year old male with ADHD and anxiety who presents for medication management and follow-up.  He has experienced a recent weight loss of four pounds, attributed to the summer season and a reduction in bread consumption. He feels slightly more energetic after cutting down on carbs, although he experienced an early wake-up at 3 AM after falling asleep early the previous night.  He is currently taking Adderall XR 25 mg in the morning and 15 mg in the afternoon for ADHD, which is effective without side effects such as palpitations or appetite suppression. He recalls experiencing palpitations in 2021, which resolved after reducing caffeine intake. In 2022, a cardiologist identified PVCs and offered metoprolol , but he declined due to side effects of grogginess.  For anxiety, he uses Xanax  as needed and has a few pills left. He was prescribed Lexapro  (escitalopram ), initially taken in the morning but caused him to feel 'weird' and tired. He has taken about 20 doses and is considering taking it in the evening to avoid daytime drowsiness. Lexapro  did help calm him down. He experiences yawning as a side effect.  He experiences chronic back and neck pain, for which he takes Celebrex  and Flexeril  as needed. His neck pain sometimes radiates down his arm, and his back pain can extend down his right leg. He uses these medications intermittently, depending on the severity of his symptoms.  He has year-round allergies that worsen with seasonal changes. He is considering using Xyzal , which might be covered by his insurance. He is also concerned about the cost of medications with his insurance coverage.  He is a former Teacher, early years/pre and is unsure about his  hepatitis B vaccination status, as he needs to locate his shot records from [employer]     Patient Active Problem List   Diagnosis Date Noted   B12 deficiency 11/27/2022   PVC (premature ventricular contraction) 11/27/2022   ADD (attention deficit disorder) 05/29/2015   Chronic low back pain 05/29/2015   Lactose intolerance 05/29/2015   Panic attack 05/29/2015   Perennial allergic rhinitis with seasonal variation 05/29/2015   Lumbosacral radiculitis 05/29/2015    Past Surgical History:  Procedure Laterality Date   TONSILLECTOMY     VASECTOMY Bilateral 02/21/2018    Family History  Problem Relation Age of Onset   Anxiety disorder Mother     Social History   Tobacco Use   Smoking status: Never   Smokeless tobacco: Current    Types: Chew  Substance Use Topics   Alcohol use: Yes    Alcohol/week: 18.0 standard drinks of alcohol    Types: 6 Cans of beer, 12 Standard drinks or equivalent per week     Current Outpatient Medications:    ALPRAZolam  (XANAX ) 0.5 MG tablet, Take 1 tablet (0.5 mg total) by mouth daily as needed for anxiety., Disp: 40 tablet, Rfl: 0   amphetamine -dextroamphetamine  (ADDERALL XR) 25 MG 24 hr capsule, Take 1 capsule by mouth every morning., Disp: 90 capsule, Rfl: 0   amphetamine -dextroamphetamine  (ADDERALL) 15 MG tablet, Take 1 tablet by mouth daily., Disp: 90 tablet, Rfl: 0   celecoxib  (CELEBREX ) 100 MG capsule, Take 1 capsule (100 mg total) by mouth 2 (two)  times daily., Disp: 60 capsule, Rfl: 0   cyclobenzaprine  (FLEXERIL ) 10 MG tablet, Take 0.5-1 tablets (5-10 mg total) by mouth at bedtime., Disp: 90 tablet, Rfl: 0   escitalopram  (LEXAPRO ) 10 MG tablet, Take 1 tablet (10 mg total) by mouth daily., Disp: 90 tablet, Rfl: 0   cetirizine  (ZYRTEC ) 10 MG tablet, Take 1 tablet (10 mg total) by mouth daily. (Patient not taking: Reported on 05/13/2024), Disp: 90 tablet, Rfl: 1   Olopatadine HCl 0.6 % SOLN, Place 1 spray into both nostrils daily at 12 noon.  (Patient not taking: Reported on 05/13/2024), Disp: 30 g, Rfl: 0  No Known Allergies  I personally reviewed active problem list, medication list, allergies with the patient/caregiver today.   ROS  Ten systems reviewed and is negative except as mentioned in HPI    Objective Physical Exam CONSTITUTIONAL: Patient appears well-developed and well-nourished.  No distress. HEENT: Head atraumatic, normocephalic, neck supple. CARDIOVASCULAR: Normal rate, regular rhythm and normal heart sounds.  No murmur heard. No BLE edema. PULMONARY: Effort normal and breath sounds normal. No respiratory distress. ABDOMINAL: There is no tenderness or distention. MUSCULOSKELETAL: Normal gait. Without gross motor or sensory deficit. PSYCHIATRIC: Patient has a normal mood and affect. behavior is normal. Judgment and thought content normal.  Vitals:   05/13/24 1411  BP: 132/80  Pulse: 91  Resp: 16  SpO2: 100%  Weight: 198 lb 14.4 oz (90.2 kg)  Height: 6' 1 (1.854 m)    Body mass index is 26.24 kg/m.   PHQ2/9:    05/13/2024    2:11 PM 02/15/2024    8:54 AM 08/09/2023    2:27 PM 05/09/2023    2:20 PM 02/28/2023    3:01 PM  Depression screen PHQ 2/9  Decreased Interest 0 0 0 0 0  Down, Depressed, Hopeless 0 0 0 0 0  PHQ - 2 Score 0 0 0 0 0  Altered sleeping 0 0 0 0 0  Tired, decreased energy 0 0 0 0 0  Change in appetite 0 0 0 0 0  Feeling bad or failure about yourself  0 0 0 0 0  Trouble concentrating 0 0 0 0 0  Moving slowly or fidgety/restless 0 0 0 0 0  Suicidal thoughts 0 0 0 0 0  PHQ-9 Score 0 0 0 0 0  Difficult doing work/chores Not difficult at all Not difficult at all Not difficult at all      phq 9 is negative  Fall Risk:    05/13/2024    2:11 PM 11/19/2023    9:31 AM 08/09/2023    2:23 PM 05/09/2023    2:12 PM 02/28/2023    2:56 PM  Fall Risk   Falls in the past year? 0 0 0 0 0  Number falls in past yr: 0 0 0    Injury with Fall? 0 0 0    Risk for fall due to : No Fall Risks   No Fall Risks No Fall Risks No Fall Risks  Follow up Falls prevention discussed;Education provided;Falls evaluation completed Falls evaluation completed Falls prevention discussed;Falls evaluation completed;Education provided Falls prevention discussed Falls prevention discussed     Assessment & Plan Attention-deficit hyperactivity disorder, predominantly inattentive presentation ADHD managed with Adderall XR, aiding focus without side effects. - Continue Adderall XR 25 mg in the morning and 15 mg in the afternoon.  Anxiety disorder Anxiety managed with alprazolam  and escitalopram . Adjusting escitalopram  timing to reduce daytime drowsiness. Discussed reducing alprazolam  use. -  Try escitalopram  5 mg in the evening. - Prescribe 40 alprazolam  0.5 mg tablets to last until follow up , goal is to be able to go down to about 30 per 3 months by next visit  - Monitor response to escitalopram  and adjust dosage if necessary.  Cervical radiculitis Managed with Celebrex  and Flexeril  for neck pain radiating down the arm. - Continue Celebrex  and Flexeril  as needed for cervical radiculitis.  Chronic bilateral low back pain with radiculopathy Managed with Celebrex  and Flexeril  for occasional flare-ups affecting the right leg. - Continue Celebrex  and Flexeril  as needed for low back pain with radiculopathy.  Allergic rhinitis, perennial with seasonal variation Advised to use a pharmacy discount card for affordability. - Continue Xyzal  for allergic rhinitis. - Use pharmacy discount card to determine the most affordable option for medication.  B12 deficiency -recheck labs  General Health Maintenance Blood work due. Hepatitis B vaccination records needed. - Complete blood work for lipid profile, glucose, liver function, and anemia screening. - Locate and provide hepatitis B vaccination records from [employer].

## 2024-05-14 LAB — COMPLETE METABOLIC PANEL WITHOUT GFR
AG Ratio: 2.4 (calc) (ref 1.0–2.5)
ALT: 16 U/L (ref 9–46)
AST: 12 U/L (ref 10–40)
Albumin: 4.8 g/dL (ref 3.6–5.1)
Alkaline phosphatase (APISO): 49 U/L (ref 36–130)
BUN: 11 mg/dL (ref 7–25)
CO2: 29 mmol/L (ref 20–32)
Calcium: 9.3 mg/dL (ref 8.6–10.3)
Chloride: 103 mmol/L (ref 98–110)
Creat: 1.11 mg/dL (ref 0.60–1.26)
Globulin: 2 g/dL (ref 1.9–3.7)
Glucose, Bld: 94 mg/dL (ref 65–99)
Potassium: 4.2 mmol/L (ref 3.5–5.3)
Sodium: 140 mmol/L (ref 135–146)
Total Bilirubin: 0.6 mg/dL (ref 0.2–1.2)
Total Protein: 6.8 g/dL (ref 6.1–8.1)

## 2024-05-14 LAB — CBC WITH DIFFERENTIAL/PLATELET
Absolute Lymphocytes: 1049 {cells}/uL (ref 850–3900)
Absolute Monocytes: 524 {cells}/uL (ref 200–950)
Basophils Absolute: 32 {cells}/uL (ref 0–200)
Basophils Relative: 0.7 %
Eosinophils Absolute: 69 {cells}/uL (ref 15–500)
Eosinophils Relative: 1.5 %
HCT: 40.1 % (ref 38.5–50.0)
Hemoglobin: 13.5 g/dL (ref 13.2–17.1)
MCH: 30.8 pg (ref 27.0–33.0)
MCHC: 33.7 g/dL (ref 32.0–36.0)
MCV: 91.6 fL (ref 80.0–100.0)
MPV: 10.6 fL (ref 7.5–12.5)
Monocytes Relative: 11.4 %
Neutro Abs: 2926 {cells}/uL (ref 1500–7800)
Neutrophils Relative %: 63.6 %
Platelets: 219 10*3/uL (ref 140–400)
RBC: 4.38 10*6/uL (ref 4.20–5.80)
RDW: 11.9 % (ref 11.0–15.0)
Total Lymphocyte: 22.8 %
WBC: 4.6 10*3/uL (ref 3.8–10.8)

## 2024-05-14 LAB — LIPID PANEL
Cholesterol: 179 mg/dL (ref <200)
HDL: 60 mg/dL (ref >=40)
LDL Cholesterol (Calc): 102 mg/dL — ABNORMAL HIGH
Non-HDL Cholesterol (Calc): 119 mg/dL (ref <130)
Total CHOL/HDL Ratio: 3 (calc) (ref <5.0)
Triglycerides: 78 mg/dL (ref <150)

## 2024-05-14 LAB — B12 AND FOLATE PANEL
Folate: 12 ng/mL
Vitamin B-12: 779 pg/mL (ref 200–1100)

## 2024-05-15 ENCOUNTER — Ambulatory Visit: Payer: Self-pay | Admitting: Family Medicine

## 2024-05-17 ENCOUNTER — Other Ambulatory Visit: Payer: Self-pay | Admitting: Family Medicine

## 2024-05-17 DIAGNOSIS — F411 Generalized anxiety disorder: Secondary | ICD-10-CM

## 2024-05-22 ENCOUNTER — Telehealth: Payer: Self-pay

## 2024-05-22 ENCOUNTER — Other Ambulatory Visit: Payer: Self-pay

## 2024-05-22 DIAGNOSIS — F901 Attention-deficit hyperactivity disorder, predominantly hyperactive type: Secondary | ICD-10-CM

## 2024-05-22 MED ORDER — AMPHETAMINE-DEXTROAMPHETAMINE 15 MG PO TABS
15.0000 mg | ORAL_TABLET | Freq: Every day | ORAL | 0 refills | Status: DC
Start: 2024-05-22 — End: 2024-08-05

## 2024-05-22 MED ORDER — AMPHETAMINE-DEXTROAMPHET ER 25 MG PO CP24
25.0000 mg | ORAL_CAPSULE | ORAL | 0 refills | Status: DC
Start: 2024-05-22 — End: 2024-08-05

## 2024-05-22 NOTE — Telephone Encounter (Signed)
 I was able to contact patient and he asked to have rx for Adderall 15mg  [place]mg  [place] will call back with the pharmacy name. Pt is aware Dr. Glenard is out this week and it will be a covering provider who may sign the prescription.

## 2024-05-22 NOTE — Telephone Encounter (Signed)
 Patient came in to have an early refill called in on his Xanax , and Adderall 15mg  and 25mg  due to going out of town tomorrow. I contacted patient's pharmacy since medications were sent on 05/13/24. They informed me his Xanax  is already filled and ready for pick-up. The pharmacist stated he does not do early refills and his Adderall 15mg  and 25mg  won't be ready until 05/26/24. He stated he will not do any earlier. I tried contacting patient to inform him and lvm to return call.

## 2024-08-05 ENCOUNTER — Encounter: Payer: Self-pay | Admitting: Family Medicine

## 2024-08-05 ENCOUNTER — Ambulatory Visit: Admitting: Family Medicine

## 2024-08-05 VITALS — BP 128/78 | HR 95 | Resp 16 | Ht 73.0 in | Wt 198.9 lb

## 2024-08-05 DIAGNOSIS — F901 Attention-deficit hyperactivity disorder, predominantly hyperactive type: Secondary | ICD-10-CM | POA: Diagnosis not present

## 2024-08-05 DIAGNOSIS — J3089 Other allergic rhinitis: Secondary | ICD-10-CM | POA: Diagnosis not present

## 2024-08-05 DIAGNOSIS — F411 Generalized anxiety disorder: Secondary | ICD-10-CM | POA: Diagnosis not present

## 2024-08-05 DIAGNOSIS — J302 Other seasonal allergic rhinitis: Secondary | ICD-10-CM

## 2024-08-05 DIAGNOSIS — G8929 Other chronic pain: Secondary | ICD-10-CM

## 2024-08-05 DIAGNOSIS — M5441 Lumbago with sciatica, right side: Secondary | ICD-10-CM

## 2024-08-05 DIAGNOSIS — E538 Deficiency of other specified B group vitamins: Secondary | ICD-10-CM

## 2024-08-05 MED ORDER — AMPHETAMINE-DEXTROAMPHET ER 25 MG PO CP24: 25.0000 mg | ORAL_CAPSULE | ORAL | 0 refills | Status: DC

## 2024-08-05 MED ORDER — BUPROPION HCL ER (XL) 150 MG PO TB24: 150.0000 mg | ORAL_TABLET | Freq: Every day | ORAL | 0 refills | Status: DC

## 2024-08-05 MED ORDER — ALPRAZOLAM 0.5 MG PO TABS: 0.5000 mg | ORAL_TABLET | Freq: Every day | ORAL | 0 refills | Status: DC | PRN

## 2024-08-05 MED ORDER — CELECOXIB 100 MG PO CAPS: 100.0000 mg | ORAL_CAPSULE | Freq: Two times a day (BID) | ORAL | 0 refills | Status: DC

## 2024-08-05 MED ORDER — AMPHETAMINE-DEXTROAMPHETAMINE 15 MG PO TABS: 15.0000 mg | ORAL_TABLET | Freq: Every day | ORAL | 0 refills | Status: DC

## 2024-08-05 NOTE — Progress Notes (Signed)
 Name: William Rivers   MRN: [id number]    DOB: 1991-03-03   Date:08/05/2024       Progress Note  Subjective  Chief Complaint  Chief Complaint  Patient presents with   Medical Management of Chronic Issues   Discussed the use of AI scribe software for clinical note transcription with the patient, who gave verbal consent to proceed.  History of Present Illness William Rivers is a 32 year old male with ADHD and anxiety who presents for medication management.  He manages his ADHD with Adderall XR 25 mg in the morning and an additional 15 mg in the afternoon, taking the first dose at 4:30 AM due to his early work schedule. He works approximately 80-90 hours per week, which impacts his sleep, averaging around 6 to 7 hours per night.  He experiences anxiety and panic attacks, previously managed with alprazolam  0.5 mg. He received a prescription for 40 tablets on June 26 and has only one or two tablets left, indicating frequent use. He is due for a refill on September 23. He was prescribed Lexapro  (escitalopram ) for anxiety but discontinued it after a week due to excessive yawning and feeling like a 'zombie'.  For back pain, he uses Celebrex  and Flexeril  as needed. He has some Flexeril  left but requires a refill of Celebrex , which he last received in a 30-day supply last year.  His social history includes working at [employer], where he has taken on more responsibilities due to a family member's illness. He enjoys outdoor activities such as hunting and fishing, often involving his children in these activities. He has four children, aged 20, 40, 34, and 64, with the youngest being a daughter.    Patient Active Problem List   Diagnosis Date Noted   Lumbar radiculopathy 08/13/2023   B12 deficiency 11/27/2022   PVC (premature ventricular contraction) 11/27/2022   ADD (attention deficit disorder) 05/29/2015   Chronic low back pain 05/29/2015   Lactose intolerance 05/29/2015   Panic attack 05/29/2015    Perennial allergic rhinitis with seasonal variation 05/29/2015   Lumbosacral radiculitis 05/29/2015    Past Surgical History:  Procedure Laterality Date   TONSILLECTOMY     VASECTOMY Bilateral 02/21/2018    Family History  Problem Relation Age of Onset   Anxiety disorder Mother     Social History   Tobacco Use   Smoking status: Never   Smokeless tobacco: Current    Types: Chew  Substance Use Topics   Alcohol use: Yes    Alcohol/week: 18.0 standard drinks of alcohol    Types: 6 Cans of beer, 12 Standard drinks or equivalent per week     Current Outpatient Medications:    ALPRAZolam  (XANAX ) 0.5 MG tablet, Take 1 tablet (0.5 mg total) by mouth daily as needed for anxiety., Disp: 40 tablet, Rfl: 0   amphetamine -dextroamphetamine  (ADDERALL XR) 25 MG 24 hr capsule, Take 1 capsule by mouth every morning., Disp: 30 capsule, Rfl: 0   amphetamine -dextroamphetamine  (ADDERALL) 15 MG tablet, Take 1 tablet by mouth daily., Disp: 30 tablet, Rfl: 0   celecoxib  (CELEBREX ) 100 MG capsule, Take 1 capsule (100 mg total) by mouth 2 (two) times daily., Disp: 60 capsule, Rfl: 0   cyclobenzaprine  (FLEXERIL ) 10 MG tablet, Take 0.5-1 tablets (5-10 mg total) by mouth at bedtime., Disp: 90 tablet, Rfl: 0   escitalopram  (LEXAPRO ) 10 MG tablet, TAKE 1 TABLET BY MOUTH EVERY DAY, Disp: 90 tablet, Rfl: 0   levocetirizine (XYZAL ) 5 MG tablet, Take 1 tablet (  5 mg total) by mouth every evening., Disp: 90 tablet, Rfl: 1  No Known Allergies  I personally reviewed active problem list, medication list, allergies, family history with the patient/caregiver today.   ROS  Ten systems reviewed and is negative except as mentioned in HPI    Objective Physical Exam CONSTITUTIONAL: Patient appears well-developed and well-nourished.  No distress. HEENT: Head atraumatic, normocephalic, neck supple. CARDIOVASCULAR: Normal rate, regular rhythm and normal heart sounds.  No murmur heard. No BLE edema. PULMONARY:  Effort normal and breath sounds normal. No respiratory distress. MUSCULOSKELETAL: Normal gait. Without gross motor or sensory deficit. PSYCHIATRIC: Patient has a normal mood and affect. behavior is normal. Judgment and thought content normal.  Vitals:   08/05/24 1423  BP: 128/78  Pulse: 95  Resp: 16  SpO2: 99%  Weight: 198 lb 14.4 oz (90.2 kg)  Height: 6' 1 (1.854 m)    Body mass index is 26.24 kg/m.  Recent Results (from the past 2160 hours)  B12 and Folate Panel     Status: None   Collection Time: 05/13/24  2:34 PM  Result Value Ref Range   Vitamin B-12 779 200 - 1,100 pg/mL   Folate 12.0 ng/mL    Comment:                            Reference Range                            Low:           <3.4                            Borderline:    3.4-5.4                            Normal:        >5.4 .   CBC with Differential/Platelet     Status: None   Collection Time: 05/13/24  2:34 PM  Result Value Ref Range   WBC 4.6 3.8 - 10.8 Thousand/uL   RBC 4.38 4.20 - 5.80 Million/uL   Hemoglobin 13.5 13.2 - 17.1 g/dL   HCT 59.8 61.4 - 49.9 %   MCV 91.6 80.0 - 100.0 fL   MCH 30.8 27.0 - 33.0 pg   MCHC 33.7 32.0 - 36.0 g/dL    Comment: For adults, a slight decrease in the calculated MCHC value (in the range of 30 to 32 g/dL) is most likely not clinically significant; however, it should be interpreted with caution in correlation with other red cell parameters and the patient's clinical condition.    RDW 11.9 11.0 - 15.0 %   Platelets 219 140 - 400 Thousand/uL   MPV 10.6 7.5 - 12.5 fL   Neutro Abs 2,926 1,500 - 7,800 cells/uL   Absolute Lymphocytes 1,049 850 - 3,900 cells/uL   Absolute Monocytes 524 200 - 950 cells/uL   Eosinophils Absolute 69 15 - 500 cells/uL   Basophils Absolute 32 0 - 200 cells/uL   Neutrophils Relative % 63.6 %   Total Lymphocyte 22.8 %   Monocytes Relative 11.4 %   Eosinophils Relative 1.5 %   Basophils Relative 0.7 %  COMPLETE METABOLIC PANEL WITHOUT  GFR     Status: None   Collection Time: 05/13/24  2:34 PM  Result Value  Ref Range   Glucose, Bld 94 65 - 99 mg/dL    Comment: .            Fasting reference interval .    BUN 11 7 - 25 mg/dL   Creat 8.88 9.39 - 8.73 mg/dL   BUN/Creatinine Ratio SEE NOTE: 6 - 22 (calc)    Comment:    Not Reported: BUN and Creatinine are within    reference range. .    Sodium 140 135 - 146 mmol/L   Potassium 4.2 3.5 - 5.3 mmol/L   Chloride 103 98 - 110 mmol/L   CO2 29 20 - 32 mmol/L   Calcium 9.3 8.6 - 10.3 mg/dL   Total Protein 6.8 6.1 - 8.1 g/dL   Albumin 4.8 3.6 - 5.1 g/dL   Globulin 2.0 1.9 - 3.7 g/dL (calc)   AG Ratio 2.4 1.0 - 2.5 (calc)   Total Bilirubin 0.6 0.2 - 1.2 mg/dL   Alkaline phosphatase (APISO) 49 36 - 130 U/L   AST 12 10 - 40 U/L   ALT 16 9 - 46 U/L  Lipid panel     Status: Abnormal   Collection Time: 05/13/24  2:34 PM  Result Value Ref Range   Cholesterol 179 <200 mg/dL   HDL 60 > OR = 40 mg/dL   Triglycerides 78 <849 mg/dL   LDL Cholesterol (Calc) 102 (H) mg/dL (calc)    Comment: Reference range: <100 . Desirable range <100 mg/dL for primary prevention;   <70 mg/dL for patients with CHD or diabetic patients  with > or = 2 CHD risk factors. SABRA LDL-C is now calculated using the Martin-Hopkins  calculation, which is a validated novel method providing  better accuracy than the Friedewald equation in the  estimation of LDL-C.  Gladis APPLETHWAITE et al. SANDREA. 7986;689(80): 2061-2068  (http://education.[facility].com/faq/FAQ164)    Total CHOL/HDL Ratio 3.0 <5.0 (calc)   Non-HDL Cholesterol (Calc) 119 <130 mg/dL (calc)    Comment: For patients with diabetes plus 1 major ASCVD risk  factor, treating to a non-HDL-C goal of <100 mg/dL  (LDL-C of <29 mg/dL) is considered a therapeutic  option.      PHQ2/9:    08/05/2024    2:20 PM 05/13/2024    2:11 PM 02/15/2024    8:54 AM 08/09/2023    2:27 PM 05/09/2023    2:20 PM  Depression screen PHQ 2/9  Decreased Interest 0 0 0  0 0  Down, Depressed, Hopeless 0 0 0 0 0  PHQ - 2 Score 0 0 0 0 0  Altered sleeping  0 0 0 0  Tired, decreased energy  0 0 0 0  Change in appetite  0 0 0 0  Feeling bad or failure about yourself   0 0 0 0  Trouble concentrating  0 0 0 0  Moving slowly or fidgety/restless  0 0 0 0  Suicidal thoughts  0 0 0 0  PHQ-9 Score  0 0 0 0  Difficult doing work/chores  Not difficult at all Not difficult at all Not difficult at all     phq 9 is negative  Fall Risk:    08/05/2024    2:20 PM 05/13/2024    2:11 PM 11/19/2023    9:31 AM 08/09/2023    2:23 PM 05/09/2023    2:12 PM  Fall Risk   Falls in the past year? 0 0 0 0 0  Number falls in past yr: 0 0 0 0  Injury with Fall? 0 0 0 0   Risk for fall due to : No Fall Risks No Fall Risks  No Fall Risks No Fall Risks  Follow up Falls evaluation completed Falls prevention discussed;Education provided;Falls evaluation completed Falls evaluation completed Falls prevention discussed;Falls evaluation completed;Education provided Falls prevention discussed      Assessment & Plan Attention-deficit hyperactivity disorder, predominantly hyperactive type ADHD not managed effectively with current Adderall XR regimen. - Continue Adderall XR 25 mg in the morning and 15 mg in the afternoon. - Schedule medication refill for September 23.  Depression Transitioning to bupropion  due to excessive drowsiness from escitalopram . Discussed bupropion 's benefits and side effects. - Discontinue escitalopram . - Initiate bupropion  sustained release in the morning. - Monitor response to bupropion  and consider switching to Pristiq if ineffective.  Generalized anxiety disorder Managed with alprazolam  0.5 mg. Refill needed soon. - Prescribe alprazolam  0.5 mg with refill on September 23.  Chronic back pain Managed with Flexeril  and Cyclobenzaprine . Celebrex  supply needs replenishment. - Prescribe Celebrex  30 tablets for as-needed use during back pain  flares.  General Health Maintenance Discussed importance of flu vaccination for family protection. - Encourage flu vaccination.

## 2024-08-05 NOTE — Progress Notes (Deleted)
 Name: William Rivers   MRN: [id number]    DOB: Apr 18, 1991   Date:08/05/2024       Progress Note  Subjective  Chief Complaint  Chief Complaint  Patient presents with   Medical Management of Chronic Issues   {HPI:32069} *** Patient Active Problem List   Diagnosis Date Noted   Lumbar radiculopathy 08/13/2023   B12 deficiency 11/27/2022   PVC (premature ventricular contraction) 11/27/2022   ADD (attention deficit disorder) 05/29/2015   Chronic low back pain 05/29/2015   Lactose intolerance 05/29/2015   Panic attack 05/29/2015   Perennial allergic rhinitis with seasonal variation 05/29/2015   Lumbosacral radiculitis 05/29/2015    Past Surgical History:  Procedure Laterality Date   TONSILLECTOMY     VASECTOMY Bilateral 02/21/2018    Family History  Problem Relation Age of Onset   Anxiety disorder Mother     Social History   Tobacco Use   Smoking status: Never   Smokeless tobacco: Current    Types: Chew  Substance Use Topics   Alcohol use: Yes    Alcohol/week: 18.0 standard drinks of alcohol    Types: 6 Cans of beer, 12 Standard drinks or equivalent per week     Current Outpatient Medications:    ALPRAZolam  (XANAX ) 0.5 MG tablet, Take 1 tablet (0.5 mg total) by mouth daily as needed for anxiety., Disp: 40 tablet, Rfl: 0   amphetamine -dextroamphetamine  (ADDERALL XR) 25 MG 24 hr capsule, Take 1 capsule by mouth every morning., Disp: 30 capsule, Rfl: 0   amphetamine -dextroamphetamine  (ADDERALL) 15 MG tablet, Take 1 tablet by mouth daily., Disp: 30 tablet, Rfl: 0   celecoxib  (CELEBREX ) 100 MG capsule, Take 1 capsule (100 mg total) by mouth 2 (two) times daily., Disp: 60 capsule, Rfl: 0   cyclobenzaprine  (FLEXERIL ) 10 MG tablet, Take 0.5-1 tablets (5-10 mg total) by mouth at bedtime., Disp: 90 tablet, Rfl: 0   escitalopram  (LEXAPRO ) 10 MG tablet, TAKE 1 TABLET BY MOUTH EVERY DAY, Disp: 90 tablet, Rfl: 0   levocetirizine (XYZAL ) 5 MG tablet, Take 1 tablet (5 mg total) by  mouth every evening., Disp: 90 tablet, Rfl: 1  No Known Allergies  I personally reviewed {Reviewed:14835} with the patient/caregiver today.   ROS  ***  Objective Physical Exam   Vitals:   08/05/24 1423  BP: 128/78  Pulse: 95  Resp: 16  SpO2: 99%  Weight: 198 lb 14.4 oz (90.2 kg)  Height: 6' 1 (1.854 m)    Body mass index is 26.24 kg/m.  Recent Results (from the past 2160 hours)  B12 and Folate Panel     Status: None   Collection Time: 05/13/24  2:34 PM  Result Value Ref Range   Vitamin B-12 779 200 - 1,100 pg/mL   Folate 12.0 ng/mL    Comment:                            Reference Range                            Low:           <3.4                            Borderline:    3.4-5.4  Normal:        >5.4 .   CBC with Differential/Platelet     Status: None   Collection Time: 05/13/24  2:34 PM  Result Value Ref Range   WBC 4.6 3.8 - 10.8 Thousand/uL   RBC 4.38 4.20 - 5.80 Million/uL   Hemoglobin 13.5 13.2 - 17.1 g/dL   HCT 59.8 61.4 - 49.9 %   MCV 91.6 80.0 - 100.0 fL   MCH 30.8 27.0 - 33.0 pg   MCHC 33.7 32.0 - 36.0 g/dL    Comment: For adults, a slight decrease in the calculated MCHC value (in the range of 30 to 32 g/dL) is most likely not clinically significant; however, it should be interpreted with caution in correlation with other red cell parameters and the patient's clinical condition.    RDW 11.9 11.0 - 15.0 %   Platelets 219 140 - 400 Thousand/uL   MPV 10.6 7.5 - 12.5 fL   Neutro Abs 2,926 1,500 - 7,800 cells/uL   Absolute Lymphocytes 1,049 850 - 3,900 cells/uL   Absolute Monocytes 524 200 - 950 cells/uL   Eosinophils Absolute 69 15 - 500 cells/uL   Basophils Absolute 32 0 - 200 cells/uL   Neutrophils Relative % 63.6 %   Total Lymphocyte 22.8 %   Monocytes Relative 11.4 %   Eosinophils Relative 1.5 %   Basophils Relative 0.7 %  COMPLETE METABOLIC PANEL WITHOUT GFR     Status: None   Collection Time: 05/13/24  2:34  PM  Result Value Ref Range   Glucose, Bld 94 65 - 99 mg/dL    Comment: .            Fasting reference interval .    BUN 11 7 - 25 mg/dL   Creat 8.88 9.39 - 8.73 mg/dL   BUN/Creatinine Ratio SEE NOTE: 6 - 22 (calc)    Comment:    Not Reported: BUN and Creatinine are within    reference range. .    Sodium 140 135 - 146 mmol/L   Potassium 4.2 3.5 - 5.3 mmol/L   Chloride 103 98 - 110 mmol/L   CO2 29 20 - 32 mmol/L   Calcium 9.3 8.6 - 10.3 mg/dL   Total Protein 6.8 6.1 - 8.1 g/dL   Albumin 4.8 3.6 - 5.1 g/dL   Globulin 2.0 1.9 - 3.7 g/dL (calc)   AG Ratio 2.4 1.0 - 2.5 (calc)   Total Bilirubin 0.6 0.2 - 1.2 mg/dL   Alkaline phosphatase (APISO) 49 36 - 130 U/L   AST 12 10 - 40 U/L   ALT 16 9 - 46 U/L  Lipid panel     Status: Abnormal   Collection Time: 05/13/24  2:34 PM  Result Value Ref Range   Cholesterol 179 <200 mg/dL   HDL 60 > OR = 40 mg/dL   Triglycerides 78 <849 mg/dL   LDL Cholesterol (Calc) 102 (H) mg/dL (calc)    Comment: Reference range: <100 . Desirable range <100 mg/dL for primary prevention;   <70 mg/dL for patients with CHD or diabetic patients  with > or = 2 CHD risk factors. SABRA LDL-C is now calculated using the Martin-Hopkins  calculation, which is a validated novel method providing  better accuracy than the Friedewald equation in the  estimation of LDL-C.  Gladis APPLETHWAITE et al. SANDREA. 7986;689(80): 2061-2068  (http://education.[facility].com/faq/FAQ164)    Total CHOL/HDL Ratio 3.0 <5.0 (calc)   Non-HDL Cholesterol (Calc) 119 <130 mg/dL (calc)    Comment:  For patients with diabetes plus 1 major ASCVD risk  factor, treating to a non-HDL-C goal of <100 mg/dL  (LDL-C of <29 mg/dL) is considered a therapeutic  option.     Diabetic Foot Exam: {Perform Simple Foot Exam  Perform Detailed exam:1} {Insert foot Exam (Optional):30965}   PHQ2/9:    08/05/2024    2:20 PM 05/13/2024    2:11 PM 02/15/2024    8:54 AM 08/09/2023    2:27 PM 05/09/2023    2:20  PM  Depression screen PHQ 2/9  Decreased Interest 0 0 0 0 0  Down, Depressed, Hopeless 0 0 0 0 0  PHQ - 2 Score 0 0 0 0 0  Altered sleeping  0 0 0 0  Tired, decreased energy  0 0 0 0  Change in appetite  0 0 0 0  Feeling bad or failure about yourself   0 0 0 0  Trouble concentrating  0 0 0 0  Moving slowly or fidgety/restless  0 0 0 0  Suicidal thoughts  0 0 0 0  PHQ-9 Score  0 0 0 0  Difficult doing work/chores  Not difficult at all Not difficult at all Not difficult at all     phq 9 is {gen pos wzh:684356}  Fall Risk:    08/05/2024    2:20 PM 05/13/2024    2:11 PM 11/19/2023    9:31 AM 08/09/2023    2:23 PM 05/09/2023    2:12 PM  Fall Risk   Falls in the past year? 0 0 0 0 0  Number falls in past yr: 0 0 0 0   Injury with Fall? 0 0 0 0   Risk for fall due to : No Fall Risks No Fall Risks  No Fall Risks No Fall Risks  Follow up Falls evaluation completed Falls prevention discussed;Education provided;Falls evaluation completed Falls evaluation completed Falls prevention discussed;Falls evaluation completed;Education provided Falls prevention discussed     {A&P:32071}

## 2024-08-18 ENCOUNTER — Other Ambulatory Visit: Payer: Self-pay | Admitting: Family Medicine

## 2024-08-18 DIAGNOSIS — F901 Attention-deficit hyperactivity disorder, predominantly hyperactive type: Secondary | ICD-10-CM

## 2024-08-18 NOTE — Telephone Encounter (Deleted)
 Copied from CRM #[id number]. Topic: Clinical - Medication Refill >> Aug 18, 2024  3:02 PM Antony S wrote: Medication:  amphetamine -dextroamphetamine  (ADDERALL) 15 MG tablet amphetamine -dextroamphetamine  (ADDERALL XR) 25 MG 24 hr capsule  Has the patient contacted their pharmacy? Yes (Agent: If no, request that the patient contact the pharmacy for the refill. If patient does not wish to contact the pharmacy document the reason why and proceed with request.) (Agent: If yes, when and what did the pharmacy advise?)  This is the patient's preferred pharmacy:  [facility] [id number] [place], [place] - [address] [place] [address] Phone: [phone] Fax: [phone]  Is this the correct pharmacy for this prescription? Yes If no, delete pharmacy and type the correct one.   Has the prescription been filled recently? No  Is the patient out of the medication? Yes  Has the patient been seen for an appointment in the last year OR does the patient have an upcoming appointment? Yes  Can we respond through MyChart? No  Agent: Please be advised that Rx refills may take up to 3 business days. We ask that you follow-up with your pharmacy.

## 2024-08-18 NOTE — Telephone Encounter (Unsigned)
 Copied from CRM #[id number]. Topic: Clinical - Medication Refill >> Aug 18, 2024  3:02 PM Antony S wrote: Medication:  amphetamine -dextroamphetamine  (ADDERALL) 15 MG tablet amphetamine -dextroamphetamine  (ADDERALL XR) 25 MG 24 hr capsule  Has the patient contacted their pharmacy? Yes (Agent: If no, request that the patient contact the pharmacy for the refill. If patient does not wish to contact the pharmacy document the reason why and proceed with request.) (Agent: If yes, when and what did the pharmacy advise?)  This is the patient's preferred pharmacy:  [facility] [id number] [place], [place] - [address] [place] [address] Phone: [phone] Fax: [phone]  Is this the correct pharmacy for this prescription? Yes If no, delete pharmacy and type the correct one.   Has the prescription been filled recently? No  Is the patient out of the medication? Yes  Has the patient been seen for an appointment in the last year OR does the patient have an upcoming appointment? Yes  Can we respond through MyChart? No  Agent: Please be advised that Rx refills may take up to 3 business days. We ask that you follow-up with your pharmacy.

## 2024-08-20 NOTE — Telephone Encounter (Signed)
 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     Not Delegated - Psychiatry:  Stimulants/ADHD Failed - 08/20/2024 12:16 PM      Failed - This refill cannot be delegated      Failed - Urine Drug Screen completed in last 360 days      Passed - Last BP in normal range    BP Readings from Last 1 Encounters:  08/05/24 128/78         Passed - Last Heart Rate in normal range    Pulse Readings from Last 1 Encounters:  08/05/24 95         Passed - Valid encounter within last 6 months    Recent Outpatient Visits           2 weeks ago Attention deficit hyperactivity disorder (ADHD), predominantly hyperactive type   [facility] [place] Mire, MD   3 months ago Attention deficit hyperactivity disorder (ADHD), predominantly hyperactive type   [facility] [place] Mire, MD   6 months ago Attention deficit hyperactivity disorder (ADHD), predominantly hyperactive type   [facility] Sowles, Krichna, MD               amphetamine -dextroamphetamine  (ADDERALL) 15 MG tablet 90 tablet 0    Sig: Take 1 tablet by mouth daily.     There is no refill protocol information for this order       Requested Prescriptions  Pending Prescriptions Disp Refills   amphetamine -dextroamphetamine  (ADDERALL XR) 25 MG 24 hr capsule 90 capsule 0    Sig: Take 1 capsule by mouth every morning.     Not Delegated - Psychiatry:  Stimulants/ADHD Failed - 08/20/2024 12:16 PM      Failed - This refill  cannot be delegated      Failed - Urine Drug Screen completed in last 360 days      Passed - Last BP in normal range    BP Readings from Last 1 Encounters:  08/05/24 128/78         Passed - Last Heart Rate in normal range    Pulse Readings from Last 1 Encounters:  08/05/24 95         Passed - Valid encounter within last 6 months    Recent Outpatient Visits           2 weeks ago Attention deficit hyperactivity disorder (ADHD), predominantly hyperactive type   [facility] [place] Mire, MD   3 months ago Attention deficit hyperactivity disorder (ADHD), predominantly hyperactive type   [facility] Glen[place] Mire, MD   6 months ago Attention deficit hyperactivity disorder (ADHD), predominantly hyperactive type   [facility] Sowles, Krichna, MD               amphetamine -dextroamphetamine  (ADDERALL) 15 MG tablet 90 tablet 0    Sig: Take 1 tablet by mouth daily.     There is no refill protocol information for this order

## 2024-08-20 NOTE — Telephone Encounter (Signed)
 Unable to refuse

## 2024-08-22 ENCOUNTER — Other Ambulatory Visit: Payer: Self-pay | Admitting: Family Medicine

## 2024-09-15 ENCOUNTER — Other Ambulatory Visit: Payer: Self-pay | Admitting: Family Medicine

## 2024-10-10 ENCOUNTER — Other Ambulatory Visit: Payer: Self-pay | Admitting: Family Medicine

## 2024-10-11 NOTE — Telephone Encounter (Signed)
 Requested medication (s) are due for refill today: Yes  Requested medication (s) are on the active medication list: Yes  Last refill:  09/15/24  Future visit scheduled: Yes  Notes to clinic:  Manual review.    Requested Prescriptions  Pending Prescriptions Disp Refills   celecoxib  (CELEBREX ) 100 MG capsule [Pharmacy Med Name: CELECOXIB  100 MG CAPSULE] 60 capsule 0    Sig: TAKE 1 CAPSULE BY MOUTH 2 TIMES A DAY     Analgesics:  COX2 Inhibitors Failed - 10/11/2024  5:19 PM      Failed - Manual Review: Labs are only required if the patient has taken medication for more than 8 weeks.      Failed - eGFR is 30 or above and within 360 days    GFR, Est African American  Date Value Ref Range Status  12/07/2020 89 > OR = 60 mL/min/1.65m2 Final   GFR, Est Non African American  Date Value Ref Range Status  12/07/2020 77 > OR = 60 mL/min/1.32m2 Final   eGFR  Date Value Ref Range Status  11/27/2022 90 > OR = 60 mL/min/1.62m2 Final         Passed - HGB in normal range and within 360 days    Hemoglobin  Date Value Ref Range Status  05/13/2024 13.5 13.2 - 17.1 g/dL Final         Passed - Cr in normal range and within 360 days    Creat  Date Value Ref Range Status  05/13/2024 1.11 0.60 - 1.26 mg/dL Final         Passed - HCT in normal range and within 360 days    HCT  Date Value Ref Range Status  05/13/2024 40.1 38.5 - 50.0 % Final         Passed - AST in normal range and within 360 days    AST  Date Value Ref Range Status  05/13/2024 12 10 - 40 U/L Final         Passed - ALT in normal range and within 360 days    ALT  Date Value Ref Range Status  05/13/2024 16 9 - 46 U/L Final         Passed - Patient is not pregnant      Passed - Valid encounter within last 12 months    Recent Outpatient Visits           2 months ago Attention deficit hyperactivity disorder (ADHD), predominantly hyperactive type   [facility] William Mire, MD   5  months ago Attention deficit hyperactivity disorder (ADHD), predominantly hyperactive type   [facility] William Mire, MD   7 months ago Attention deficit hyperactivity disorder (ADHD), predominantly hyperactive type   [facility] Sowles, Krichna, MD

## 2024-10-13 NOTE — Telephone Encounter (Signed)
 Second request

## 2024-11-04 ENCOUNTER — Ambulatory Visit: Admitting: Family Medicine

## 2024-11-04 ENCOUNTER — Encounter: Payer: Self-pay | Admitting: Family Medicine

## 2024-11-04 VITALS — BP 134/76 | HR 96 | Resp 16 | Ht 73.0 in | Wt 205.3 lb

## 2024-11-04 DIAGNOSIS — G8929 Other chronic pain: Secondary | ICD-10-CM

## 2024-11-04 DIAGNOSIS — J3089 Other allergic rhinitis: Secondary | ICD-10-CM

## 2024-11-04 DIAGNOSIS — J302 Other seasonal allergic rhinitis: Secondary | ICD-10-CM

## 2024-11-04 DIAGNOSIS — F901 Attention-deficit hyperactivity disorder, predominantly hyperactive type: Secondary | ICD-10-CM

## 2024-11-04 DIAGNOSIS — F411 Generalized anxiety disorder: Secondary | ICD-10-CM

## 2024-11-04 DIAGNOSIS — E538 Deficiency of other specified B group vitamins: Secondary | ICD-10-CM

## 2024-11-04 DIAGNOSIS — M545 Low back pain, unspecified: Secondary | ICD-10-CM

## 2024-11-04 MED ORDER — ALPRAZOLAM 0.5 MG PO TABS: 0.5000 mg | ORAL_TABLET | Freq: Every day | ORAL | 0 refills | Status: AC | PRN

## 2024-11-04 MED ORDER — AMPHETAMINE-DEXTROAMPHETAMINE 15 MG PO TABS: 15.0000 mg | ORAL_TABLET | Freq: Every day | ORAL | 0 refills | Status: AC

## 2024-11-04 MED ORDER — AMPHETAMINE-DEXTROAMPHET ER 25 MG PO CP24: 25.0000 mg | ORAL_CAPSULE | ORAL | 0 refills | Status: AC

## 2024-11-04 NOTE — Progress Notes (Signed)
 Name: William Rivers   MRN: [id number]    DOB: December 08, 1990   Date:11/04/2024       Progress Note  Subjective  Chief Complaint  Chief Complaint  Patient presents with   Medical Management of Chronic Issues   Discussed the use of AI scribe software for clinical note transcription with the patient, who gave verbal consent to proceed.  History of Present Illness William Rivers is a 33 year old male who presents with issues related to medication management and stress.  He is experiencing issues with his Adderall prescription refills. A prescription written on September 16th for a 90-day supply of Adderall XR 25 mg and immediate release 15 mg was not filled until November 2nd. He is currently taking both medications and usually pays cash for them.  He reports feeling tired. He typically sleeps six to seven hours but felt sluggish after sleeping nine hours the previous night. He also took Adderall today.  He has a history of using Wellbutrin  for depression or anxiety but stopped due to fatigue. He uses alprazolam  as needed for stress, taking one about every six days, and recently took a whole one during the day due to increased stress.  He occasionally takes Celebrex  for back pain and inflammation, noting it helps when he remembers to take it. He also uses over-the-counter saline spray for allergies and congestion, having stopped using levocetirizine tablets.  He is managing increased responsibilities at work following the death of a family member who had bladder cancer that metastasized to the liver and bone. 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    Patient Active Problem List   Diagnosis Date Noted   Lumbar radiculopathy 08/13/2023   B12 deficiency 11/27/2022   PVC (premature ventricular contraction) 11/27/2022   ADD (attention deficit disorder) 05/29/2015   Chronic low back pain 05/29/2015   Lactose  intolerance 05/29/2015   Panic attack 05/29/2015   Perennial allergic rhinitis with seasonal variation 05/29/2015   Lumbosacral radiculitis 05/29/2015    Past Surgical History:  Procedure Laterality Date   TONSILLECTOMY     VASECTOMY Bilateral 02/21/2018    Family History  Problem Relation Age of Onset   Anxiety disorder Mother     Social History   Tobacco Use   Smoking status: Never   Smokeless tobacco: Current    Types: Chew  Substance Use Topics   Alcohol use: Yes    Alcohol/week: 18.0 standard drinks of alcohol    Types: 6 Cans of beer, 12 Standard drinks or equivalent per week    Current Medications[1]  Allergies[2]  I personally reviewed active problem list, medication list, allergies with the patient/caregiver today.   ROS  Ten systems reviewed and is negative except as mentioned in HPI    Objective Physical Exam CONSTITUTIONAL: Patient appears well-developed and well-nourished.  No distress. HEENT: Head atraumatic, normocephalic, neck supple. CARDIOVASCULAR: Normal rate, regular rhythm and normal heart sounds.  No murmur heard. No BLE edema. PULMONARY: Effort normal and breath sounds normal. No respiratory distress. ABDOMINAL: There is no tenderness or distention. MUSCULOSKELETAL: Normal gait. Without gross motor or sensory deficit. PSYCHIATRIC: Patient has a normal mood and affect. behavior is normal. Judgment and thought content normal.  Vitals:   11/04/24 1033  BP: 134/76  Pulse: 96  Resp: 16  SpO2: 99%  Weight: 205 lb 4.8 oz (93.1 kg)  Height: 6' 1 (1.854 m)    Body mass  index is 27.09 kg/m.    PHQ2/9:    11/04/2024   10:33 AM 08/05/2024    2:20 PM 05/13/2024    2:11 PM 02/15/2024    8:54 AM 08/09/2023    2:27 PM  Depression screen PHQ 2/9  Decreased Interest 0 0 0 0 0  Down, Depressed, Hopeless 0 0 0 0 0  PHQ - 2 Score 0 0 0 0 0  Altered sleeping   0 0 0  Tired, decreased energy   0 0 0  Change in appetite   0 0 0  Feeling bad  or failure about yourself    0 0 0  Trouble concentrating   0 0 0  Moving slowly or fidgety/restless   0 0 0  Suicidal thoughts   0 0 0  PHQ-9 Score   0  0  0   Difficult doing work/chores   Not difficult at all Not difficult at all Not difficult at all     Data saved with a previous flowsheet row definition    phq 9 is negative  Fall Risk:    11/04/2024   10:33 AM 08/05/2024    2:20 PM 05/13/2024    2:11 PM 11/19/2023    9:31 AM 08/09/2023    2:23 PM  Fall Risk   Falls in the past year? 0 0 0 0 0  Number falls in past yr: 0 0 0 0 0  Injury with Fall? 0 0  0  0  0   Risk for fall due to : No Fall Risks No Fall Risks No Fall Risks  No Fall Risks  Follow up Falls evaluation completed Falls evaluation completed Falls prevention discussed;Education provided;Falls evaluation completed Falls evaluation completed Falls prevention discussed;Falls evaluation completed;Education provided     Data saved with a previous flowsheet row definition      Assessment & Plan Attention-deficit hyperactivity disorder, predominantly hyperactive type ADHD managed with Adderall XR 25 mg and immediate release 15 mg. Prescription refill issues noted. - Sent prescription for Adderall XR 25 mg and immediate release 15 mg to be filled on January 31st, 2026.  Generalized anxiety disorder Managed with alprazolam  as needed. Wellbutrin  discontinued due to fatigue. - Prescribed alprazolam  with a total of 52 tablets to last until the next Adderall refill. - Discontinued Wellbutrin  due to side effects.  Chronic bilateral low back pain Effective pain relief with Celebrex  as needed. - Continue Celebrex  as needed for pain management.  Allergic rhinitis Managed with over-the-counter saline spray. - Continue using saline spray as needed for symptom relief.        [1]  Current Outpatient Medications:    ALPRAZolam  (XANAX ) 0.5 MG tablet, Take 1 tablet (0.5 mg total) by mouth daily as needed for anxiety.,  Disp: 40 tablet, Rfl: 0   amphetamine -dextroamphetamine  (ADDERALL XR) 25 MG 24 hr capsule, Take 1 capsule by mouth every morning., Disp: 90 capsule, Rfl: 0   amphetamine -dextroamphetamine  (ADDERALL) 15 MG tablet, Take 1 tablet by mouth daily., Disp: 90 tablet, Rfl: 0   buPROPion  (WELLBUTRIN  XL) 150 MG 24 hr tablet, Take 1 tablet (150 mg total) by mouth daily., Disp: 90 tablet, Rfl: 0   celecoxib  (CELEBREX ) 100 MG capsule, TAKE 1 CAPSULE BY MOUTH 2 TIMES A DAY, Disp: 60 capsule, Rfl: 0   cyclobenzaprine  (FLEXERIL ) 10 MG tablet, Take 0.5-1 tablets (5-10 mg total) by mouth at bedtime., Disp: 90 tablet, Rfl: 0   levocetirizine (XYZAL ) 5 MG tablet, Take 1 tablet (5 mg  total) by mouth every evening., Disp: 90 tablet, Rfl: 1 [2] No Known Allergies

## 2024-11-14 ENCOUNTER — Other Ambulatory Visit: Payer: Self-pay | Admitting: Family Medicine

## 2024-11-17 NOTE — Telephone Encounter (Signed)
 Requested Prescriptions  Refused Prescriptions Disp Refills   buPROPion  (WELLBUTRIN  XL) 150 MG 24 hr tablet [Pharmacy Med Name: buPROPion  HCL XL 150 MG TABLET] 90 tablet 0    Sig: TAKE 1 TABLET BY MOUTH DAILY     Psychiatry: Antidepressants - bupropion  Passed - 11/17/2024 12:02 PM      Passed - Cr in normal range and within 360 days    Creat  Date Value Ref Range Status  05/13/2024 1.11 0.60 - 1.26 mg/dL Final         Passed - AST in normal range and within 360 days    AST  Date Value Ref Range Status  05/13/2024 12 10 - 40 U/L Final         Passed - ALT in normal range and within 360 days    ALT  Date Value Ref Range Status  05/13/2024 16 9 - 46 U/L Final         Passed - Last BP in normal range    BP Readings from Last 1 Encounters:  11/04/24 134/76         Passed - Valid encounter within last 6 months    Recent Outpatient Visits           1 week ago Attention deficit hyperactivity disorder (ADHD), predominantly hyperactive type   [facility] Glen[place] Mire, MD   3 months ago Attention deficit hyperactivity disorder (ADHD), predominantly hyperactive type   [facility] Glenard Mire, MD   6 months ago Attention deficit hyperactivity disorder (ADHD), predominantly hyperactive type   [facility] Glenard Mire, MD   9 months ago Attention deficit hyperactivity disorder (ADHD), predominantly hyperactive type   [facility] Sowles, Krichna, MD

## 2025-02-11 ENCOUNTER — Ambulatory Visit: Admitting: Family Medicine
